# Patient Record
Sex: Female | Born: 1965 | Race: White | Hispanic: No | Marital: Married | Smoking: Never smoker
Health system: Southern US, Community
[De-identification: ages and names within clinical notes are randomized; demographics above are authoritative.]

## PROBLEM LIST (undated history)

## (undated) DIAGNOSIS — Z9889 Other specified postprocedural states: Secondary | ICD-10-CM

## (undated) DIAGNOSIS — G4730 Sleep apnea, unspecified: Secondary | ICD-10-CM

## (undated) DIAGNOSIS — R112 Nausea with vomiting, unspecified: Secondary | ICD-10-CM

## (undated) DIAGNOSIS — R0902 Hypoxemia: Secondary | ICD-10-CM

## (undated) DIAGNOSIS — T4145XA Adverse effect of unspecified anesthetic, initial encounter: Secondary | ICD-10-CM

## (undated) DIAGNOSIS — T7840XA Allergy, unspecified, initial encounter: Secondary | ICD-10-CM

## (undated) DIAGNOSIS — E079 Disorder of thyroid, unspecified: Secondary | ICD-10-CM

## (undated) DIAGNOSIS — M1990 Unspecified osteoarthritis, unspecified site: Secondary | ICD-10-CM

## (undated) DIAGNOSIS — J45909 Unspecified asthma, uncomplicated: Secondary | ICD-10-CM

## (undated) DIAGNOSIS — C50919 Malignant neoplasm of unspecified site of unspecified female breast: Secondary | ICD-10-CM

## (undated) DIAGNOSIS — T8859XA Other complications of anesthesia, initial encounter: Secondary | ICD-10-CM

## (undated) DIAGNOSIS — E039 Hypothyroidism, unspecified: Secondary | ICD-10-CM

## (undated) HISTORY — PX: APPENDECTOMY: SHX54

## (undated) HISTORY — PX: ABDOMINAL HYSTERECTOMY: SHX81

## (undated) HISTORY — DX: Allergy, unspecified, initial encounter: T78.40XA

## (undated) HISTORY — DX: Hypoxemia: R09.02

## (undated) HISTORY — DX: Disorder of thyroid, unspecified: E07.9

## (undated) HISTORY — PX: CHOLECYSTECTOMY: SHX55

## (undated) HISTORY — DX: Unspecified asthma, uncomplicated: J45.909

## (undated) HISTORY — DX: Unspecified osteoarthritis, unspecified site: M19.90

## (undated) HISTORY — DX: Sleep apnea, unspecified: G47.30

## (undated) HISTORY — DX: Malignant neoplasm of unspecified site of unspecified female breast: C50.919

---
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
## 1989-12-27 HISTORY — PX: APPENDECTOMY: SHX54

## 2007-09-12 ENCOUNTER — Emergency Department: Payer: Self-pay | Admitting: Emergency Medicine

## 2012-06-20 ENCOUNTER — Ambulatory Visit: Payer: Self-pay

## 2012-06-20 LAB — HM MAMMOGRAPHY

## 2013-03-22 ENCOUNTER — Ambulatory Visit: Payer: Self-pay

## 2013-07-05 ENCOUNTER — Ambulatory Visit: Payer: Self-pay

## 2013-07-23 ENCOUNTER — Ambulatory Visit: Payer: Self-pay

## 2013-07-23 LAB — HM MAMMOGRAPHY

## 2014-08-13 LAB — TSH: TSH: 1.34 u[IU]/mL (ref 0.41–5.90)

## 2014-08-27 ENCOUNTER — Ambulatory Visit: Payer: Self-pay

## 2014-08-27 LAB — HM MAMMOGRAPHY: HM Mammogram: NEGATIVE

## 2014-11-04 ENCOUNTER — Ambulatory Visit: Payer: Self-pay | Admitting: Internal Medicine

## 2014-11-19 ENCOUNTER — Ambulatory Visit: Payer: Self-pay | Admitting: Otolaryngology

## 2015-01-13 ENCOUNTER — Encounter (INDEPENDENT_AMBULATORY_CARE_PROVIDER_SITE_OTHER): Payer: Self-pay

## 2015-01-13 ENCOUNTER — Ambulatory Visit (INDEPENDENT_AMBULATORY_CARE_PROVIDER_SITE_OTHER): Payer: Self-pay | Admitting: Internal Medicine

## 2015-01-13 ENCOUNTER — Encounter: Payer: Self-pay | Admitting: Internal Medicine

## 2015-01-13 VITALS — BP 113/75 | HR 84 | Temp 98.0°F | Ht 61.0 in | Wt 136.2 lb

## 2015-01-13 DIAGNOSIS — J4520 Mild intermittent asthma, uncomplicated: Secondary | ICD-10-CM

## 2015-01-13 DIAGNOSIS — Z9109 Other allergy status, other than to drugs and biological substances: Secondary | ICD-10-CM

## 2015-01-13 DIAGNOSIS — E039 Hypothyroidism, unspecified: Secondary | ICD-10-CM

## 2015-01-13 DIAGNOSIS — Z91048 Other nonmedicinal substance allergy status: Secondary | ICD-10-CM

## 2015-01-13 DIAGNOSIS — N809 Endometriosis, unspecified: Secondary | ICD-10-CM

## 2015-01-13 DIAGNOSIS — N951 Menopausal and female climacteric states: Secondary | ICD-10-CM

## 2015-01-13 NOTE — Progress Notes (Signed)
Pre visit review using our clinic review tool, if applicable. No additional management support is needed unless otherwise documented below in the visit note. 

## 2015-01-14 ENCOUNTER — Other Ambulatory Visit (INDEPENDENT_AMBULATORY_CARE_PROVIDER_SITE_OTHER): Payer: BC Managed Care – PPO

## 2015-01-14 ENCOUNTER — Other Ambulatory Visit: Payer: Self-pay | Admitting: Internal Medicine

## 2015-01-14 ENCOUNTER — Telehealth: Payer: Self-pay | Admitting: *Deleted

## 2015-01-14 DIAGNOSIS — Z13220 Encounter for screening for lipoid disorders: Secondary | ICD-10-CM

## 2015-01-14 DIAGNOSIS — E039 Hypothyroidism, unspecified: Secondary | ICD-10-CM | POA: Insufficient documentation

## 2015-01-14 DIAGNOSIS — R5383 Other fatigue: Secondary | ICD-10-CM

## 2015-01-14 DIAGNOSIS — E034 Atrophy of thyroid (acquired): Secondary | ICD-10-CM

## 2015-01-14 LAB — COMPREHENSIVE METABOLIC PANEL
ALT: 32 U/L (ref 0–35)
AST: 23 U/L (ref 0–37)
Albumin: 4.1 g/dL (ref 3.5–5.2)
Alkaline Phosphatase: 70 U/L (ref 39–117)
BUN: 14 mg/dL (ref 6–23)
CHLORIDE: 110 meq/L (ref 96–112)
CO2: 27 mEq/L (ref 19–32)
Calcium: 9.6 mg/dL (ref 8.4–10.5)
Creatinine, Ser: 0.76 mg/dL (ref 0.40–1.20)
GFR: 86.76 mL/min (ref >60.00)
GLUCOSE: 75 mg/dL (ref 70–99)
POTASSIUM: 5.1 meq/L (ref 3.5–5.1)
Sodium: 142 mEq/L (ref 135–145)
TOTAL PROTEIN: 6.7 g/dL (ref 6.0–8.3)
Total Bilirubin: 0.5 mg/dL (ref 0.2–1.2)

## 2015-01-14 LAB — LIPID PANEL
CHOLESTEROL: 181 mg/dL (ref 0–200)
HDL: 54.1 mg/dL (ref >39.00)
LDL CALC: 110 mg/dL — AB (ref 0–99)
NonHDL: 126.9
Total CHOL/HDL Ratio: 3
Triglycerides: 83 mg/dL (ref 0.0–149.0)
VLDL: 16.6 mg/dL (ref 0.0–40.0)

## 2015-01-14 LAB — CBC WITH DIFFERENTIAL/PLATELET
BASOS PCT: 0.4 % (ref 0.0–3.0)
Basophils Absolute: 0 10*3/uL (ref 0.0–0.1)
EOS PCT: 4.7 % (ref 0.0–5.0)
Eosinophils Absolute: 0.4 10*3/uL (ref 0.0–0.7)
HCT: 44.7 % (ref 36.0–46.0)
Hemoglobin: 15 g/dL (ref 12.0–15.0)
LYMPHS PCT: 33.2 % (ref 12.0–46.0)
Lymphs Abs: 2.7 10*3/uL (ref 0.7–4.0)
MCHC: 33.6 g/dL (ref 30.0–36.0)
MCV: 90.3 fl (ref 78.0–100.0)
MONOS PCT: 6.5 % (ref 3.0–12.0)
Monocytes Absolute: 0.5 10*3/uL (ref 0.1–1.0)
Neutro Abs: 4.4 10*3/uL (ref 1.4–7.7)
Neutrophils Relative %: 55.2 % (ref 43.0–77.0)
PLATELETS: 379 10*3/uL (ref 150.0–400.0)
RBC: 4.95 Mil/uL (ref 3.87–5.11)
RDW: 13.5 % (ref 11.5–15.5)
WBC: 8.1 10*3/uL (ref 4.0–10.5)

## 2015-01-14 NOTE — Telephone Encounter (Signed)
What labs and dx?  

## 2015-01-14 NOTE — Telephone Encounter (Signed)
Order placed for labs.

## 2015-01-15 ENCOUNTER — Encounter: Payer: Self-pay | Admitting: Internal Medicine

## 2015-01-15 LAB — TSH: TSH: 1.13 u[IU]/mL (ref 0.35–4.50)

## 2015-01-17 ENCOUNTER — Encounter: Payer: Self-pay | Admitting: Internal Medicine

## 2015-01-17 DIAGNOSIS — Z9109 Other allergy status, other than to drugs and biological substances: Secondary | ICD-10-CM | POA: Insufficient documentation

## 2015-01-17 DIAGNOSIS — J45909 Unspecified asthma, uncomplicated: Secondary | ICD-10-CM | POA: Insufficient documentation

## 2015-01-17 DIAGNOSIS — N951 Menopausal and female climacteric states: Secondary | ICD-10-CM | POA: Insufficient documentation

## 2015-01-17 DIAGNOSIS — N809 Endometriosis, unspecified: Secondary | ICD-10-CM | POA: Insufficient documentation

## 2015-01-17 NOTE — Progress Notes (Signed)
   Subjective:    Patient ID: Angela Pacheco, female    DOB: 18-Oct-1966, 49 y.o.   MRN: [id number]  HPI 48 year old female with past history of allergies, mild asthma and hypothyroidism.  She comes in today to follow up on these issues as well as to establish care.  Former pt at [facility].  States her allergies are triggered by cats.  Takes claritin.  Is also taking a menopause supplement.  Helps with hot flashes and night sweats.  Has noticed some weight gain.  Is s/p hysterectomy for endometriosis.  Cervix left in place.  Has never had an abnormal pap smear.  She tries to avoid wheat and has tried to be gluten free. This has helped with bloating.   No acid reflux.  No bowel change reported.  Breathing stable.     Past Medical History  Diagnosis Date  . Allergy     cats trigger  . Thyroid disease   . Asthma     mild    Outpatient Encounter Prescriptions as of 01/13/2015  Medication Sig  . levothyroxine (SYNTHROID, LEVOTHROID) 88 MCG tablet Take 88 mcg by mouth daily.    Review of Systems Patient denies any headache, lightheadedness or dizziness.  No significant sinus or allergy symptoms now.   No chest pain, tightness or palpitations.  No increased shortness of breath or congestion.  No nausea or vomiting.  No abdominal pain or cramping.  No bowel change, such as diarrhea, constipation, BRBPR or melana.  No urine change.   No vaginal problems.  Is s/p hysterectomy.  Some hot flashes and night sweats.  Better taking her supplement.  Controlling her diet.  Helps with bloating.       Objective:   Physical Exam Filed Vitals:   01/13/15 1614  BP: 113/75  Pulse: 84  Temp: 98 F (48.38 C)   49 year old female in no acute distress.   HEENT:  Nares- clear.  Oropharynx - without lesions. NECK:  Supple.  Nontender.  No audible bruit.  HEART:  Appears to be regular. LUNGS:  No crackles or wheezing audible.  Respirations even and unlabored.  RADIAL PULSE:  Equal  bilaterally. ABDOMEN:  Soft, nontender.  Bowel sounds present and normal.  No audible abdominal bruit.    EXTREMITIES:  No increased edema present.  DP pulses palpable and equal bilaterally.          Assessment & Plan:  1. Environmental allergies Controlled.  On claritin.   2. Asthma, mild intermittent, uncomplicated Breathing stable.  Follow.    3. Menopausal symptoms With some hot flashes and night sweats as outlined.  Better on her supplement.  Follow.    4. Endometriosis Is s/p hysterectomy.  Cervix left in place.    5. Hypothyroidism, unspecified hypothyroidism type On synthroid.  Follow tsh.    HEALTH MAINTENANCE.  Get her back in for a physical.  Will need pap smear since cervix left in place.  Had mammogram summer 2015.  Obtain records.    I spent more than 30 minutes with the patient and more than 50% of the time was spent in consultation regarding the above.

## 2015-02-04 ENCOUNTER — Encounter: Payer: Self-pay | Admitting: Internal Medicine

## 2015-02-05 ENCOUNTER — Ambulatory Visit (INDEPENDENT_AMBULATORY_CARE_PROVIDER_SITE_OTHER): Payer: BC Managed Care – PPO | Admitting: Nurse Practitioner

## 2015-02-05 ENCOUNTER — Encounter: Payer: Self-pay | Admitting: Nurse Practitioner

## 2015-02-05 VITALS — BP 106/76 | HR 76 | Temp 97.6°F | Resp 12 | Ht 61.0 in | Wt 137.0 lb

## 2015-02-05 DIAGNOSIS — B9689 Other specified bacterial agents as the cause of diseases classified elsewhere: Secondary | ICD-10-CM

## 2015-02-05 DIAGNOSIS — J019 Acute sinusitis, unspecified: Secondary | ICD-10-CM | POA: Insufficient documentation

## 2015-02-05 MED ORDER — DOXYCYCLINE HYCLATE 100 MG PO TABS: 100.0000 mg | ORAL_TABLET | Freq: Two times a day (BID) | ORAL | Status: DC

## 2015-02-05 NOTE — Patient Instructions (Signed)
Call us if worsening or failing to improve within 5 days.   Please take a probiotic ( Align, Floraque or Culturelle) while you are on the antibiotic to prevent a serious antibiotic associated diarrhea  Called clostirudium dificile colitis and a vaginal yeast infection.

## 2015-02-05 NOTE — Progress Notes (Signed)
Subjective:    Patient ID: Angela Pacheco, female    DOB: [birth year], 49 y.o.   MRN: [id number]  HPI   Angela Pacheco is a 49 yo female with a CC of sinus pressure.   1) Noticed 3 weeks ago, sinus pressure on right maxillary sinus, red blotch yesterday under right eye, A lot of pressure in nose and forehead, teeth on top ache on both sides, has bad smell occasionally in nose Neti Pot- somewhat helpful Daytime cold medicine- Helpful  Claritin D- Helpful   Discussed diet and exercise plan. She is making plans to increase exercise.   Review of Systems  Constitutional: Positive for fatigue. Negative for fever, chills and diaphoresis.  HENT: Positive for congestion, ear pain, facial swelling, postnasal drip, rhinorrhea, sinus pressure and sneezing. Negative for ear discharge and sore throat.   Eyes: Negative for pain, discharge, redness, itching and visual disturbance.  Respiratory: Positive for cough. Negative for chest tightness, shortness of breath and wheezing.   Cardiovascular: Negative for chest pain, palpitations and leg swelling.  Gastrointestinal: Negative for nausea, vomiting and diarrhea.  Skin: Negative for rash.  Neurological: Positive for headaches. Negative for dizziness.   Past Medical History  Diagnosis Date  . Allergy     cats trigger  . Thyroid disease   . Asthma     mild    History   Social History  . Marital Status: Married    Spouse Name: N/A  . Number of Children: N/A  . Years of Education: N/A   Occupational History  . Not on file.   Social History Main Topics  . Smoking status: Never Smoker   . Smokeless tobacco: Never Used  . Alcohol Use: No  . Drug Use: No  . Sexual Activity: Not on file   Other Topics Concern  . Not on file   Social History Narrative    Past Surgical History  Procedure Laterality Date  . Abdominal hysterectomy      cervix left in place  . Cholecystectomy    . Appendectomy  1991    Family History    Problem Relation Age of Onset  . Arthritis Mother     No Known Allergies  Current Outpatient Prescriptions on File Prior to Visit  Medication Sig Dispense Refill  . levothyroxine (SYNTHROID, LEVOTHROID) 88 MCG tablet Take 88 mcg by mouth daily.  0   No current facility-administered medications on file prior to visit.   Past Medical History  Diagnosis Date  . Allergy     cats trigger  . Thyroid disease   . Asthma     mild    History   Social History  . Marital Status: Married    Spouse Name: N/A  . Number of Children: N/A  . Years of Education: N/A   Occupational History  . Not on file.   Social History Main Topics  . Smoking status: Never Smoker   . Smokeless tobacco: Never Used  . Alcohol Use: No  . Drug Use: No  . Sexual Activity: Not on file   Other Topics Concern  . Not on file   Social History Narrative    Past Surgical History  Procedure Laterality Date  . Abdominal hysterectomy      cervix left in place  . Cholecystectomy    . Appendectomy  1991    Family History  Problem Relation Age of Onset  . Arthritis Mother     No Known Allergies  Current Outpatient  Prescriptions on File Prior to Visit  Medication Sig Dispense Refill  . levothyroxine (SYNTHROID, LEVOTHROID) 88 MCG tablet Take 88 mcg by mouth daily.  0   No current facility-administered medications on file prior to visit.      Objective:   Physical Exam  Constitutional: She is oriented to person, place, and time. She appears well-developed and well-nourished. No distress.  BP 106/76 mmHg  Pulse 76  Temp(Src) 97.6 F (36.4 C) (Oral)  Resp 12  Ht 5\' 1"  (1.549 m)  Wt 137 lb (62.143 kg)  BMI 25.90 kg/m2  SpO2 97%  LMP    HENT:  Head: Normocephalic and atraumatic.  Right Ear: External ear normal.  Left Ear: External ear normal.  Mouth/Throat: No oropharyngeal exudate.  Eyes: Conjunctivae and EOM are normal. Pupils are equal, round, and reactive to light. Right eye exhibits  no discharge. Left eye exhibits no discharge. No scleral icterus.  Right eye is slightly puffier underneath, face is tender to palpation on right  Neck: Normal range of motion. Neck supple. No thyromegaly present.  Cardiovascular: Normal rate, regular rhythm, normal heart sounds and intact distal pulses.  Exam reveals no gallop and no friction rub.   No murmur heard. Pulmonary/Chest: Effort normal and breath sounds normal. No respiratory distress. She has no wheezes. She has no rales. She exhibits no tenderness.  Lymphadenopathy:    She has no cervical adenopathy.  Neurological: She is alert and oriented to person, place, and time. No cranial nerve deficit. She exhibits normal muscle tone. Coordination normal.  Skin: Skin is warm and dry. No rash noted. She is not diaphoretic.  Psychiatric: She has a normal mood and affect. Her behavior is normal. Judgment and thought content normal.       Assessment & Plan:

## 2015-02-05 NOTE — Assessment & Plan Note (Signed)
Worsening. 3 weeks with no relief and worsening symptoms. Doxycycline 100 mg twice daily for 7 days. Continue with OTC medications also and encouraged probiotic use. FU prn worsening/failure to improve.

## 2015-02-05 NOTE — Progress Notes (Signed)
Pre visit review using our clinic review tool, if applicable. No additional management support is needed unless otherwise documented below in the visit note. 

## 2015-02-11 MED ORDER — FLUCONAZOLE 150 MG PO TABS: ORAL_TABLET | ORAL | Status: DC

## 2015-02-11 NOTE — Telephone Encounter (Signed)
Order placed for diflucan x 2 tablets as directed.

## 2015-03-31 ENCOUNTER — Ambulatory Visit (INDEPENDENT_AMBULATORY_CARE_PROVIDER_SITE_OTHER): Payer: BC Managed Care – PPO | Admitting: Internal Medicine

## 2015-03-31 ENCOUNTER — Encounter: Payer: Self-pay | Admitting: Internal Medicine

## 2015-03-31 ENCOUNTER — Other Ambulatory Visit (HOSPITAL_COMMUNITY)
Admission: RE | Admit: 2015-03-31 | Discharge: 2015-03-31 | Disposition: A | Discharge status: Home / Self Care | Payer: BC Managed Care – PPO | Source: Ambulatory Visit | Attending: Internal Medicine | Admitting: Internal Medicine

## 2015-03-31 VITALS — BP 100/70 | HR 75 | Temp 98.5°F | Ht 61.0 in | Wt 136.2 lb

## 2015-03-31 DIAGNOSIS — Z01411 Encounter for gynecological examination (general) (routine) with abnormal findings: Secondary | ICD-10-CM | POA: Diagnosis present

## 2015-03-31 DIAGNOSIS — Z1151 Encounter for screening for human papillomavirus (HPV): Secondary | ICD-10-CM | POA: Insufficient documentation

## 2015-03-31 DIAGNOSIS — Z9109 Other allergy status, other than to drugs and biological substances: Secondary | ICD-10-CM

## 2015-03-31 DIAGNOSIS — J4520 Mild intermittent asthma, uncomplicated: Secondary | ICD-10-CM

## 2015-03-31 DIAGNOSIS — Z124 Encounter for screening for malignant neoplasm of cervix: Secondary | ICD-10-CM

## 2015-03-31 DIAGNOSIS — E039 Hypothyroidism, unspecified: Secondary | ICD-10-CM | POA: Diagnosis not present

## 2015-03-31 DIAGNOSIS — Z91048 Other nonmedicinal substance allergy status: Secondary | ICD-10-CM

## 2015-03-31 DIAGNOSIS — Z0000 Encounter for general adult medical examination without abnormal findings: Secondary | ICD-10-CM | POA: Diagnosis not present

## 2015-03-31 MED ORDER — LEVOTHYROXINE SODIUM 88 MCG PO TABS: 88.0000 ug | ORAL_TABLET | Freq: Every day | ORAL | Status: DC

## 2015-03-31 NOTE — Progress Notes (Signed)
Pre visit review using our clinic review tool, if applicable. No additional management support is needed unless otherwise documented below in the visit note. 

## 2015-03-31 NOTE — Progress Notes (Signed)
Patient ID: Angela Pacheco, female   DOB: 08-13-1966, 49 y.o.   MRN: [id number]   Subjective:    Patient ID: Angela Pacheco, female    DOB: July 08, 1966, 49 y.o.   MRN: [id number]  HPI  Patient here for a physical.  States she is doing relatively well.  No cardiac symptoms with increased activity or exertion.  [place].  Exercises four days per week.  Is dieting.  Breathing stable.  Eating and drinking well.  No nausea or vomiting.  No bowel change.  Using claritin D and flonase for her allergies.     Past Medical History  Diagnosis Date  . Allergy     cats trigger  . Thyroid disease   . Asthma     mild    Outpatient Encounter Prescriptions as of 03/31/2015  Medication Sig  . fluticasone (FLONASE) 50 MCG/ACT nasal spray Place 2 sprays into both nostrils daily.  Marland Kitchen levothyroxine (SYNTHROID, LEVOTHROID) 88 MCG tablet Take 1 tablet (88 mcg total) by mouth daily.  Marland Kitchen loratadine-pseudoephedrine (CLARITIN-D 24-HOUR) 10-240 MG per 24 hr tablet Take 1 tablet by mouth daily.  Marland Kitchen Specialty Vitamins Products (MENOPAUSE SUPPORT PO) Take by mouth.  . [DISCONTINUED] levothyroxine (SYNTHROID, LEVOTHROID) 88 MCG tablet Take 88 mcg by mouth daily.  . [DISCONTINUED] doxycycline (VIBRA-TABS) 100 MG tablet Take 1 tablet (100 mg total) by mouth 2 (two) times daily.  . [DISCONTINUED] fluconazole (DIFLUCAN) 150 MG tablet Take one tablet x 1 and repeat in 3 days if no improvement.    Review of Systems  Constitutional: Negative for appetite change and unexpected weight change.  HENT: Negative for congestion and sinus pressure.   Eyes: Negative for pain and visual disturbance.  Respiratory: Negative for cough, chest tightness and shortness of breath.   Cardiovascular: Negative for chest pain, palpitations and leg swelling.  Gastrointestinal: Negative for nausea, vomiting, abdominal pain and diarrhea.  Genitourinary: Negative for dysuria and difficulty urinating.  Musculoskeletal:  Negative for back pain and joint swelling.       Exercising.    Skin: Negative for color change and rash.  Neurological: Negative for dizziness, light-headedness and headaches.  Hematological: Negative for adenopathy. Does not bruise/bleed easily.  Psychiatric/Behavioral: Negative for dysphoric mood and agitation.       Objective:    Physical Exam  Constitutional: She is oriented to person, place, and time. She appears well-developed and well-nourished.  HENT:  Nose: Nose normal.  Mouth/Throat: Oropharynx is clear and moist.  Eyes: Right eye exhibits no discharge. Left eye exhibits no discharge. No scleral icterus.  Neck: Neck supple. No thyromegaly present.  Cardiovascular: Normal rate and regular rhythm.   Pulmonary/Chest: Breath sounds normal. No accessory muscle usage. No tachypnea. No respiratory distress. She has no decreased breath sounds. She has no wheezes. She has no rhonchi. Right breast exhibits no inverted nipple, no mass, no nipple discharge and no tenderness (no axillary adenopathy). Left breast exhibits no inverted nipple, no mass, no nipple discharge and no tenderness (no axilarry adenopathy).  Abdominal: Soft. Bowel sounds are normal. There is no tenderness.  Genitourinary:  Normal external genitalia.  Vaginal vault without lesions.  Cervix identified.  Pap smear performed.  Could not appreciate any adnexal masses or tenderness.    Musculoskeletal: She exhibits no edema or tenderness.  Lymphadenopathy:    She has no cervical adenopathy.  Neurological: She is alert and oriented to person, place, and time.  Skin: Skin is warm. No rash noted.  Psychiatric: She  has a normal mood and affect. Her behavior is normal.    BP 100/70 mmHg  Pulse 75  Temp(Src) 98.5 F (36.9 C) (Oral)  Ht 5\' 1"  (1.549 m)  Wt 136 lb 4 oz (61.803 kg)  BMI 25.76 kg/m2  SpO2 98%  LMP  Wt Readings from Last 3 Encounters:  03/31/15 136 lb 4 oz (61.803 kg)  02/05/15 137 lb (62.143 kg)    01/13/15 136 lb 4 oz (61.803 kg)     Lab Results  Component Value Date   WBC 8.1 01/14/2015   HGB 15.0 01/14/2015   HCT 44.7 01/14/2015   PLT 379.0 01/14/2015   GLUCOSE 75 01/14/2015   CHOL 181 01/14/2015   TRIG 83.0 01/14/2015   HDL 54.10 01/14/2015   LDLCALC 110* 01/14/2015   ALT 32 01/14/2015   AST 23 01/14/2015   NA 142 01/14/2015   K 5.1 01/14/2015   CL 110 01/14/2015   CREATININE 0.76 01/14/2015   BUN 14 01/14/2015   CO2 27 01/14/2015   TSH 1.13 01/14/2015       Assessment & Plan:   Problem List Items Addressed This Visit    Asthma    Breathing stable.        Environmental allergies    Doing well with claritin D and flonase.  Follow.        Health care maintenance    Physical 03/31/15.  PAP 03/31/15.  Mammogram 08/27/14 - Birads I.        Hypothyroidism    On thyroid replacement.  Follow tsh.        Relevant Medications   levothyroxine (SYNTHROID, LEVOTHROID) tablet    Other Visit Diagnoses    Pap smear for cervical cancer screening    -  Primary    Relevant Orders    Cytology - PAP (Completed)      I spent 25 minutes with the patient and more than 50% of the time was spent in consultation regarding the above.     [name], MD

## 2015-04-02 LAB — CYTOLOGY - PAP

## 2015-04-04 ENCOUNTER — Encounter: Payer: Self-pay | Admitting: Internal Medicine

## 2015-04-05 ENCOUNTER — Encounter: Payer: Self-pay | Admitting: Internal Medicine

## 2015-04-05 DIAGNOSIS — Z0000 Encounter for general adult medical examination without abnormal findings: Secondary | ICD-10-CM | POA: Insufficient documentation

## 2015-04-05 NOTE — Assessment & Plan Note (Signed)
Doing well with claritin D and flonase.  Follow.

## 2015-04-05 NOTE — Assessment & Plan Note (Signed)
Physical 03/31/15.  PAP 03/31/15.  Mammogram 08/27/14 - Birads I.

## 2015-04-05 NOTE — Assessment & Plan Note (Signed)
On thyroid replacement.  Follow tsh.  

## 2015-04-05 NOTE — Assessment & Plan Note (Signed)
Breathing stable.

## 2015-06-03 ENCOUNTER — Encounter: Payer: Self-pay | Admitting: Internal Medicine

## 2015-06-05 ENCOUNTER — Ambulatory Visit: Payer: BC Managed Care – PPO | Admitting: Internal Medicine

## 2015-06-05 DIAGNOSIS — Z0289 Encounter for other administrative examinations: Secondary | ICD-10-CM

## 2015-06-09 ENCOUNTER — Encounter: Payer: Self-pay | Admitting: Nurse Practitioner

## 2015-06-09 ENCOUNTER — Ambulatory Visit (INDEPENDENT_AMBULATORY_CARE_PROVIDER_SITE_OTHER): Payer: BC Managed Care – PPO | Admitting: Nurse Practitioner

## 2015-06-09 VITALS — BP 102/70 | HR 72 | Temp 97.4°F | Resp 12 | Ht 61.0 in | Wt 134.1 lb

## 2015-06-09 DIAGNOSIS — B9689 Other specified bacterial agents as the cause of diseases classified elsewhere: Secondary | ICD-10-CM

## 2015-06-09 DIAGNOSIS — J019 Acute sinusitis, unspecified: Secondary | ICD-10-CM | POA: Diagnosis not present

## 2015-06-09 MED ORDER — DOXYCYCLINE HYCLATE 100 MG PO TABS: 100.0000 mg | ORAL_TABLET | Freq: Two times a day (BID) | ORAL | Status: DC

## 2015-06-09 NOTE — Patient Instructions (Signed)
Continue OTC measures, doxycyline twice daily x 7 days, probiotics recommended. Call us if not helpful in 1 week.

## 2015-06-09 NOTE — Progress Notes (Signed)
   Subjective:    Patient ID: Angela Pacheco, female    DOB: [birth year], 49 y.o.   MRN: [id number]  HPI  Angela Pacheco is a 49 yo female with a CC of sinusitis x 1 week.   1) Sinusitis- last episode was 02/05/15, pain, pressure, headache on right side Claritin, Decongestants, Neti Pot, Aleve    Review of Systems  Constitutional: Negative for fever, chills, diaphoresis and fatigue.  HENT: Positive for congestion, ear pain, sinus pressure, sneezing and sore throat. Negative for postnasal drip, rhinorrhea and trouble swallowing.        Maxillary  Eyes: Negative for visual disturbance.  Respiratory: Negative for cough, chest tightness, shortness of breath and wheezing.   Cardiovascular: Negative for chest pain, palpitations and leg swelling.  Gastrointestinal: Negative for nausea, vomiting and diarrhea.  Skin: Negative for rash.  Neurological: Negative for dizziness, numbness and headaches.      Objective:   Physical Exam  Constitutional: She is oriented to person, place, and time. She appears well-developed and well-nourished. No distress.  BP 102/70 mmHg  Pulse 72  Temp(Src) 97.4 F (36.3 C) (Oral)  Resp 12  Ht 5\' 1"  (1.549 m)  Wt 134 lb 1.9 oz (60.836 kg)  BMI 25.35 kg/m2  SpO2 98%   HENT:  Head: Normocephalic and atraumatic.  Right Ear: External ear normal.  Left Ear: External ear normal.  Mouth/Throat: Oropharynx is clear and moist.  Maxillary and frontal tenderness on the right side, TM on right injected and canal swollen, left injected to a lesser extent and slightly swollen canal.   Eyes: EOM are normal. Pupils are equal, round, and reactive to light. Right eye exhibits no discharge. Left eye exhibits no discharge. No scleral icterus.  Neck: Normal range of motion. Neck supple. Thyromegaly present.  Known nodules on right  Cardiovascular: Normal rate, regular rhythm and normal heart sounds.  Exam reveals no gallop and no friction rub.   No murmur  heard. Pulmonary/Chest: Effort normal and breath sounds normal. No respiratory distress. She has no wheezes. She has no rales. She exhibits no tenderness.  Lymphadenopathy:    She has cervical adenopathy.  Neurological: She is alert and oriented to person, place, and time. No cranial nerve deficit. She exhibits normal muscle tone. Coordination normal.  Skin: Skin is warm and dry. No rash noted. She is not diaphoretic.  Psychiatric: She has a normal mood and affect. Her behavior is normal. Judgment and thought content normal.      Assessment & Plan:

## 2015-06-09 NOTE — Progress Notes (Signed)
Pre visit review using our clinic review tool, if applicable. No additional management support is needed unless otherwise documented below in the visit note. 

## 2015-06-09 NOTE — Assessment & Plan Note (Addendum)
Worsening, greater than 7 days, continue OTC medications, encouraged probiotics, Doxycyline 100 mg bid x 7 days. FU prn worsening/failure to improve. Hysterectomy- doxy okay

## 2015-10-13 ENCOUNTER — Encounter: Payer: Self-pay | Admitting: Family Medicine

## 2015-10-13 ENCOUNTER — Ambulatory Visit (INDEPENDENT_AMBULATORY_CARE_PROVIDER_SITE_OTHER): Payer: BC Managed Care – PPO | Admitting: Family Medicine

## 2015-10-13 ENCOUNTER — Ambulatory Visit: Payer: BC Managed Care – PPO | Admitting: Nurse Practitioner

## 2015-10-13 VITALS — BP 112/78 | HR 69 | Temp 98.5°F | Ht 61.0 in | Wt 141.5 lb

## 2015-10-13 DIAGNOSIS — S0990XA Unspecified injury of head, initial encounter: Secondary | ICD-10-CM | POA: Diagnosis not present

## 2015-10-13 MED ORDER — ONDANSETRON HCL 4 MG PO TABS: 4.0000 mg | ORAL_TABLET | Freq: Three times a day (TID) | ORAL | Status: DC | PRN

## 2015-10-13 NOTE — Progress Notes (Signed)
Subjective:  Patient ID: Angela Pacheco, female    DOB: Dec 27, 1966  Age: 49 y.o. MRN: [id number]  CC: Head injury  HPI:  49 year old female presents to clinic today for evaluation of a head injury.  Head injury  Patient reports that she hit her head on the doorknob last night at ~ 9:30 PM after standing up (she was harnessing her dog).  She reports that she hit the back of her head.  After hitting her head, she developed dizziness, headache and nausea.   No vision changes. No loss of conscious.  Patient resents today for evaluation. She continues to have nausea, headache, and intermittent dizziness. She describes the dizziness as feeling "off balance".  She states that she has difficulty concentrating.  She's taken some NSAIDs for her headache.  No relieving factors.  Her husband is concerned about concussion.  Social Hx   Social History   Social History  . Marital Status: Married    Spouse Name: N/A  . Number of Children: N/A  . Years of Education: N/A   Social History Main Topics  . Smoking status: Never Smoker   . Smokeless tobacco: Never Used  . Alcohol Use: 0.0 oz/week    0 Standard drinks or equivalent per week     Comment: occ.  . Drug Use: No  . Sexual Activity: Not Asked   Other Topics Concern  . None   Social History Narrative   Review of Systems Per HPI Objective:  BP 112/78 mmHg  Pulse 69  Temp(Src) 98.5 F (36.9 C) (Oral)  Ht 5\' 1"  (1.549 m)  Wt 141 lb 8 oz (64.184 kg)  BMI 26.75 kg/m2  SpO2 98%  BP/Weight 10/13/2015 01/23/7866 06/02/2093  Systolic BP 709 628 366  Diastolic BP 78 70 70  Wt. (Lbs) 141.5 134.12 136.25  BMI 26.75 25.35 25.76   Physical Exam  Constitutional: She appears well-developed and well-nourished. No distress.  HENT:  Head: Normocephalic and atraumatic.  Mouth/Throat: Oropharynx is clear and moist.  Normal TM's bilaterally. No hematoma noted.   Neck: Neck supple.  Cardiovascular: Normal rate and  regular rhythm.   No murmur heard. Pulmonary/Chest: Effort normal and breath sounds normal. No respiratory distress. She has no wheezes. She has no rales.  Abdominal: Soft. She exhibits no distension. There is no tenderness. There is no rebound and no guarding.  Lymphadenopathy:    She has no cervical adenopathy.  Neurological: She is alert.  CN 2-12 intact. Normal strength in all extremities. PERLA. EOMI.  Psychiatric:  Flat affect.   Vitals reviewed.  Lab Results  Component Value Date   WBC 8.1 01/14/2015   HGB 15.0 01/14/2015   HCT 44.7 01/14/2015   PLT 379.0 01/14/2015   GLUCOSE 75 01/14/2015   CHOL 181 01/14/2015   TRIG 83.0 01/14/2015   HDL 54.10 01/14/2015   LDLCALC 110* 01/14/2015   ALT 32 01/14/2015   AST 23 01/14/2015   NA 142 01/14/2015   K 5.1 01/14/2015   CL 110 01/14/2015   CREATININE 0.76 01/14/2015   BUN 14 01/14/2015   CO2 27 01/14/2015   TSH 1.13 01/14/2015   Assessment & Plan:   Problem List Items Addressed This Visit    Head injury - Primary    Reported symptoms suggestive of concussion although I sincerely doubt this given the mechanism of injury. Nevertheless, will treat as concussion (see below). Advised supportive care. Zofran for nausea. NSAID's for pain. Physical and cognitive rest. Out of work thru  10/19.         Meds ordered this encounter  Medications  . ondansetron (ZOFRAN) 4 MG tablet    Sig: Take 1 tablet (4 mg total) by mouth every 8 (eight) hours as needed for nausea or vomiting.    Dispense:  20 tablet    Refill:  0    Follow-up: PRN  Thersa Salt, DO

## 2015-10-13 NOTE — Assessment & Plan Note (Signed)
Reported symptoms suggestive of concussion although I sincerely doubt this given the mechanism of injury. Nevertheless, will treat as concussion (see below). Advised supportive care. Zofran for nausea. NSAID's for pain. Physical and cognitive rest. Out of work thru 10/19.

## 2015-10-13 NOTE — Patient Instructions (Signed)
Use the zofran as needed for nausea.  Be sure to rest.  Refrain from cell phones, TV, etc. You should have cognitive rest as well.  You can use tylenol or Ibuprofen for headache.  Follow up if you fail to improve or worsen.  Take care  Dr. Lacinda Axon

## 2015-10-13 NOTE — Progress Notes (Signed)
Pre visit review using our clinic review tool, if applicable. No additional management support is needed unless otherwise documented below in the visit note. 

## 2015-10-15 ENCOUNTER — Encounter: Payer: Self-pay | Admitting: Internal Medicine

## 2015-10-15 NOTE — Telephone Encounter (Signed)
I called pt and discussed her concerns.  She is feeling better.  No headache.  Light headedness has improved.  She is drinking plenty of fluids.  Will remain out of work for the rest of the week with anticipated return to work - 10/20/15.  Letter printed.  Please fax to [phone].  Thanks    Dr Nicki Reaper

## 2015-11-13 ENCOUNTER — Ambulatory Visit (INDEPENDENT_AMBULATORY_CARE_PROVIDER_SITE_OTHER): Payer: BC Managed Care – PPO

## 2015-11-13 DIAGNOSIS — Z23 Encounter for immunization: Secondary | ICD-10-CM | POA: Diagnosis not present

## 2015-12-28 HISTORY — PX: ANTERIOR CRUCIATE LIGAMENT REPAIR: SHX115

## 2016-02-09 ENCOUNTER — Encounter: Payer: Self-pay | Admitting: Nurse Practitioner

## 2016-02-09 ENCOUNTER — Ambulatory Visit (INDEPENDENT_AMBULATORY_CARE_PROVIDER_SITE_OTHER): Payer: BC Managed Care – PPO | Admitting: Nurse Practitioner

## 2016-02-09 VITALS — BP 100/64 | HR 78 | Temp 97.9°F | Ht 61.0 in | Wt 143.5 lb

## 2016-02-09 DIAGNOSIS — S46819A Strain of other muscles, fascia and tendons at shoulder and upper arm level, unspecified arm, initial encounter: Secondary | ICD-10-CM

## 2016-02-09 DIAGNOSIS — E039 Hypothyroidism, unspecified: Secondary | ICD-10-CM

## 2016-02-09 DIAGNOSIS — E663 Overweight: Secondary | ICD-10-CM

## 2016-02-09 LAB — TSH: TSH: 2.69 u[IU]/mL (ref 0.35–4.50)

## 2016-02-09 LAB — T4, FREE: FREE T4: 1.04 ng/dL (ref 0.60–1.60)

## 2016-02-09 MED ORDER — MELOXICAM 15 MG PO TABS: 15.0000 mg | ORAL_TABLET | Freq: Every day | ORAL | Status: DC

## 2016-02-09 NOTE — Patient Instructions (Signed)
Please visit the lab today.   Try the Dr. Derrel Nip diet recommendations.   Try the mobic and flexeril (muscle relaxer) again with heat and stretching for muscles. See what your insurance company says.

## 2016-02-09 NOTE — Progress Notes (Signed)
Pre visit review using our clinic review tool, if applicable. No additional management support is needed unless otherwise documented below in the visit note. 

## 2016-02-09 NOTE — Progress Notes (Signed)
Patient ID: Angela Pacheco, female    DOB: 07/31/1966  Age: 50 y.o. MRN: [id number]  CC: Weight Loss   HPI Angela Pacheco presents for CC of fatigue, weight loss, back pain/neck pain.   1) Thyroid- Needs to check per pt  Lots of fatigue Exercising 4-5 x a week for 30 minutes at least  Not eating anything sweet, bread, cheese  Protein shake with almond milk and fruit for breakfast Lean meat veggie and fruit for lunch 1 weekly cheat meal  Grilled chicken for dinner last night   2) Neck pain and shoulder pain worse lately  Indentions from bra strap bilaterally  Massage therapy and chiropractor- helpful  Wants symptom relief  Massager at home Bought new pillow  Heat, biofreeze Working out to help with these symptoms  Has muscle relaxer at home and has Corder has a past medical history of Allergy; Thyroid disease; and Asthma.   She has past surgical history that includes Abdominal hysterectomy; Cholecystectomy; and Appendectomy (1991).   Her family history includes Arthritis in her mother.She reports that she has never smoked. She has never used smokeless tobacco. She reports that she drinks alcohol. She reports that she does not use illicit drugs.  Outpatient Prescriptions Prior to Visit  Medication Sig Dispense Refill  . fluticasone (FLONASE) 50 MCG/ACT nasal spray Place 2 sprays into both nostrils daily.    Marland Kitchen levothyroxine (SYNTHROID, LEVOTHROID) 88 MCG tablet Take 1 tablet (88 mcg total) by mouth daily. 30 tablet 11  . loratadine-pseudoephedrine (CLARITIN-D 24-HOUR) 10-240 MG per 24 hr tablet Take 1 tablet by mouth daily.    Marland Kitchen Specialty Vitamins Products (MENOPAUSE SUPPORT PO) Take by mouth.    . doxycycline (VIBRA-TABS) 100 MG tablet Take 1 tablet (100 mg total) by mouth 2 (two) times daily. (Patient not taking: Reported on 02/09/2016) 14 tablet 0  . ondansetron (ZOFRAN) 4 MG tablet Take 1 tablet (4 mg total) by mouth every 8 (eight) hours as needed  for nausea or vomiting. (Patient not taking: Reported on 02/09/2016) 20 tablet 0   No facility-administered medications prior to visit.    ROS Review of Systems  Constitutional: Positive for fatigue and unexpected weight change. Negative for fever, chills and diaphoresis.       Gaining a few lbs per pt  Respiratory: Negative for chest tightness, shortness of breath and wheezing.   Cardiovascular: Negative for chest pain, palpitations and leg swelling.  Gastrointestinal: Negative for nausea, vomiting and diarrhea.  Endocrine: Negative for cold intolerance and heat intolerance.  Musculoskeletal: Positive for arthralgias and neck pain. Negative for neck stiffness.  Skin: Negative for rash.  Neurological: Negative for dizziness, weakness, numbness and headaches.  Psychiatric/Behavioral: The patient is not nervous/anxious.     Objective:  BP 100/64 mmHg  Pulse 78  Temp(Src) 97.9 F (36.6 C) (Oral)  Ht 5\' 1"  (1.549 m)  Wt 143 lb 8 oz (65.091 kg)  BMI 27.13 kg/m2  SpO2 98%  Physical Exam  Constitutional: She is oriented to person, place, and time. She appears well-developed and well-nourished. No distress.  HENT:  Head: Normocephalic and atraumatic.  Right Ear: External ear normal.  Left Ear: External ear normal.  Neck: Normal range of motion. Neck supple. No thyromegaly present.  Cardiovascular: Normal rate, regular rhythm and normal heart sounds.  Exam reveals no gallop and no friction rub.   No murmur heard. Pulmonary/Chest: Effort normal and breath sounds normal. No respiratory distress. She has no wheezes. She  has no rales. She exhibits no tenderness.  Large pendulous breasts   Musculoskeletal: Normal range of motion. She exhibits tenderness. She exhibits no edema.  Trapezius muscles are tight bilaterally and tender to palpation  Lymphadenopathy:    She has no cervical adenopathy.  Neurological: She is alert and oriented to person, place, and time. No cranial nerve deficit.  She exhibits normal muscle tone. Coordination normal.  Skin: Skin is warm and dry. No rash noted. She is not diaphoretic.  Psychiatric: She has a normal mood and affect. Her behavior is normal. Judgment and thought content normal.   Assessment & Plan:   Lorae was seen today for weight loss.  Diagnoses and all orders for this visit:  Hypothyroidism, unspecified hypothyroidism type -     TSH -     T4, free  Trapezius strain, unspecified laterality, initial encounter  Other orders -     meloxicam (MOBIC) 15 MG tablet; Take 1 tablet (15 mg total) by mouth daily.   I have discontinued Ms. Staub's doxycycline and ondansetron. I am also having her start on meloxicam. Additionally, I am having her maintain her loratadine-pseudoephedrine, levothyroxine, fluticasone, and Specialty Vitamins Products (MENOPAUSE SUPPORT PO).  Meds ordered this encounter  Medications  . meloxicam (MOBIC) 15 MG tablet    Sig: Take 1 tablet (15 mg total) by mouth daily.    Dispense:  30 tablet    Refill:  0    Order Specific Question:  Supervising Provider    Answer:  Crecencio Mc [[id number]]     Follow-up: Return if symptoms worsen or fail to improve.

## 2016-02-11 DIAGNOSIS — E663 Overweight: Secondary | ICD-10-CM | POA: Insufficient documentation

## 2016-02-11 DIAGNOSIS — S46819A Strain of other muscles, fascia and tendons at shoulder and upper arm level, unspecified arm, initial encounter: Secondary | ICD-10-CM | POA: Insufficient documentation

## 2016-02-11 NOTE — Assessment & Plan Note (Signed)
BMI is 27 currently Trying to lose weight  Gave her low glycemic index diet handout and discussed with her She is willing to try this and continue exercising regularly FU in April with Dr. Nicki Reaper  I have spent 25 minutes face to face > 50% of time spent discussing concerns, coming up with treatment options, looking at history, and ordering labs.

## 2016-02-11 NOTE — Assessment & Plan Note (Signed)
Checking TSH and T4 free today Pt reports she has been on 88 mcg for a long time and thinks it may need to be changed. We discussed how we decide to change medication. She verbalized understanding. Will follow after results via MyChart

## 2016-02-11 NOTE — Assessment & Plan Note (Signed)
New problem to me Mobic and flexeril at home Sees chiropractor and massage therapist regularly  Tried OTC treatment  She feels it is from her breasts being very large for her frame She is considering breat reduction, but has not spoken with plastic surgery. She will contact her insurance company first.  FU with her PCP in April

## 2016-03-12 ENCOUNTER — Other Ambulatory Visit: Payer: Self-pay | Admitting: Nurse Practitioner

## 2016-04-01 ENCOUNTER — Encounter: Payer: Self-pay | Admitting: Internal Medicine

## 2016-04-01 ENCOUNTER — Ambulatory Visit (INDEPENDENT_AMBULATORY_CARE_PROVIDER_SITE_OTHER): Payer: BC Managed Care – PPO | Admitting: Internal Medicine

## 2016-04-01 VITALS — BP 110/80 | HR 74 | Temp 98.4°F | Resp 18 | Ht 61.0 in | Wt 144.4 lb

## 2016-04-01 DIAGNOSIS — E039 Hypothyroidism, unspecified: Secondary | ICD-10-CM | POA: Diagnosis not present

## 2016-04-01 DIAGNOSIS — Z91048 Other nonmedicinal substance allergy status: Secondary | ICD-10-CM | POA: Diagnosis not present

## 2016-04-01 DIAGNOSIS — Z0000 Encounter for general adult medical examination without abnormal findings: Secondary | ICD-10-CM | POA: Diagnosis not present

## 2016-04-01 DIAGNOSIS — Z1239 Encounter for other screening for malignant neoplasm of breast: Secondary | ICD-10-CM

## 2016-04-01 DIAGNOSIS — N951 Menopausal and female climacteric states: Secondary | ICD-10-CM

## 2016-04-01 DIAGNOSIS — Z9109 Other allergy status, other than to drugs and biological substances: Secondary | ICD-10-CM

## 2016-04-01 MED ORDER — LEVOTHYROXINE SODIUM 88 MCG PO TABS: 88.0000 ug | ORAL_TABLET | Freq: Every day | ORAL | Status: DC

## 2016-04-01 NOTE — Progress Notes (Signed)
Pre-visit discussion using our clinic review tool. No additional management support is needed unless otherwise documented below in the visit note.  

## 2016-04-01 NOTE — Assessment & Plan Note (Signed)
Recent tsh and free T4 wnl.  Continue synthroid.

## 2016-04-01 NOTE — Assessment & Plan Note (Signed)
Physical today.  PAP 03/31/15 - negative with negative HPV.  Last mammogram in 2015.  Scheduled for mammogram.  She will find out of mother had colon polyps.

## 2016-04-01 NOTE — Assessment & Plan Note (Signed)
Is concerned about possible wheat allergy.  See note.  Refer to an allergist.

## 2016-04-01 NOTE — Assessment & Plan Note (Signed)
Takes estroven.  Does not feel needs any further intervention.

## 2016-04-01 NOTE — Progress Notes (Signed)
Patient ID: Angela Pacheco, female   DOB: January 08, 1966, 50 y.o.   MRN: [id number]   Subjective:    Patient ID: Angela Pacheco, female    DOB: 1966/07/20, 50 y.o.   MRN: [id number]  HPI  Patient here for her physical exam.  Doing well.  Is exercising.  Exercises 3-5x/week - 30 minutes to one hour.  Still issues with her left knee.  Has been seeing Dr Mack Guise.  Does limit some of her exercise.  Still exercises regularly.  Does not limit her regular walking.  No chest pain.  No sob.  No acid reflux.  No abdominal pain or cramping.  Bowels stable.  Does report has noticed a wheat allergy.  When eats, gets increased congestion and cough.  Also has noticed some bowel change.  She also is concerned regarding not being able to lose weight despite exercise and watching her diet.  Discussed diet and exercise.  Handling stress.     Past Medical History  Diagnosis Date  . Allergy     cats trigger  . Thyroid disease   . Asthma     mild   Past Surgical History  Procedure Laterality Date  . Abdominal hysterectomy      cervix left in place  . Cholecystectomy    . Appendectomy  1991   Family History  Problem Relation Age of Onset  . Arthritis Mother    Social History   Social History  . Marital Status: Married    Spouse Name: N/A  . Number of Children: N/A  . Years of Education: N/A   Social History Main Topics  . Smoking status: Never Smoker   . Smokeless tobacco: Never Used  . Alcohol Use: 0.0 oz/week    0 Standard drinks or equivalent per week     Comment: occ.  . Drug Use: No  . Sexual Activity: Not Asked   Other Topics Concern  . None   Social History Narrative    Outpatient Encounter Prescriptions as of 04/01/2016  Medication Sig  . fluticasone (FLONASE) 50 MCG/ACT nasal spray Place 2 sprays into both nostrils daily.  Marland Kitchen levothyroxine (SYNTHROID, LEVOTHROID) 88 MCG tablet Take 1 tablet (88 mcg total) by mouth daily.  Marland Kitchen loratadine-pseudoephedrine (CLARITIN-D  24-HOUR) 10-240 MG per 24 hr tablet Take 1 tablet by mouth daily.  . meloxicam (MOBIC) 15 MG tablet TAKE 1 TABLET(15 MG) BY MOUTH DAILY  . Specialty Vitamins Products (MENOPAUSE SUPPORT PO) Take by mouth.  . [DISCONTINUED] levothyroxine (SYNTHROID, LEVOTHROID) 88 MCG tablet Take 1 tablet (88 mcg total) by mouth daily.   No facility-administered encounter medications on file as of 04/01/2016.    Review of Systems  Constitutional: Negative for appetite change and unexpected weight change.  HENT: Negative for sinus pressure.        Congestion after eating.  No symptoms now.   Eyes: Negative for pain and visual disturbance.  Respiratory: Negative for chest tightness and shortness of breath.        She has noticed cough only when she eats.   Cardiovascular: Negative for chest pain, palpitations and leg swelling.  Gastrointestinal: Negative for nausea, vomiting, abdominal pain and diarrhea.  Genitourinary: Negative for dysuria and difficulty urinating.  Musculoskeletal: Negative for back pain and joint swelling.  Skin: Negative for color change and rash.  Neurological: Negative for dizziness, light-headedness and headaches.  Hematological: Negative for adenopathy. Does not bruise/bleed easily.  Psychiatric/Behavioral: Negative for dysphoric mood and agitation.  Objective:     Blood pressure rechecked by me:  108/68  Physical Exam  Constitutional: She is oriented to person, place, and time. She appears well-developed and well-nourished. No distress.  HENT:  Nose: Nose normal.  Mouth/Throat: Oropharynx is clear and moist.  Eyes: Right eye exhibits no discharge. Left eye exhibits no discharge. No scleral icterus.  Neck: Neck supple. No thyromegaly present.  Cardiovascular: Normal rate and regular rhythm.   Pulmonary/Chest: Breath sounds normal. No accessory muscle usage. No tachypnea. No respiratory distress. She has no decreased breath sounds. She has no wheezes. She has no rhonchi.  Right breast exhibits no inverted nipple, no mass, no nipple discharge and no tenderness (no axillary adenopathy). Left breast exhibits no inverted nipple, no mass, no nipple discharge and no tenderness (no axilarry adenopathy).  Abdominal: Soft. Bowel sounds are normal. There is no tenderness.  Musculoskeletal: She exhibits no edema or tenderness.  Lymphadenopathy:    She has no cervical adenopathy.  Neurological: She is alert and oriented to person, place, and time.  Skin: Skin is warm. No rash noted. No erythema.  Psychiatric: She has a normal mood and affect. Her behavior is normal.    BP 110/80 mmHg  Pulse 74  Temp(Src) 98.4 F (36.9 C) (Oral)  Resp 18  Ht 5\' 1"  (1.549 m)  Wt 144 lb 6 oz (65.488 kg)  BMI 27.29 kg/m2  SpO2 99% Wt Readings from Last 3 Encounters:  04/01/16 144 lb 6 oz (65.488 kg)  02/09/16 143 lb 8 oz (65.091 kg)  10/13/15 141 lb 8 oz (64.184 kg)     Lab Results  Component Value Date   WBC 8.1 01/14/2015   HGB 15.0 01/14/2015   HCT 44.7 01/14/2015   PLT 379.0 01/14/2015   GLUCOSE 75 01/14/2015   CHOL 181 01/14/2015   TRIG 83.0 01/14/2015   HDL 54.10 01/14/2015   LDLCALC 110* 01/14/2015   ALT 32 01/14/2015   AST 23 01/14/2015   NA 142 01/14/2015   K 5.1 01/14/2015   CL 110 01/14/2015   CREATININE 0.76 01/14/2015   BUN 14 01/14/2015   CO2 27 01/14/2015   TSH 2.69 02/09/2016       Assessment & Plan:   Problem List Items Addressed This Visit    Environmental allergies - Primary    Is concerned about possible wheat allergy.  See note.  Refer to an allergist.        Relevant Orders   Ambulatory referral to Allergy   CBC with Differential/Platelet   Health care maintenance    Physical today.  PAP 03/31/15 - negative with negative HPV.  Last mammogram in 2015.  Scheduled for mammogram.  She will find out of mother had colon polyps.       Hypothyroidism    Recent tsh and free T4 wnl.  Continue synthroid.       Relevant Medications    levothyroxine (SYNTHROID, LEVOTHROID) 88 MCG tablet   Other Relevant Orders   Lipid panel   Menopausal symptoms    Takes estroven.  Does not feel needs any further intervention.       Relevant Orders   Comprehensive metabolic panel    Other Visit Diagnoses    Breast cancer screening        Relevant Orders    MM DIGITAL SCREENING BILATERAL        [name], MD

## 2016-04-02 ENCOUNTER — Other Ambulatory Visit (INDEPENDENT_AMBULATORY_CARE_PROVIDER_SITE_OTHER): Payer: BC Managed Care – PPO

## 2016-04-02 ENCOUNTER — Encounter: Payer: Self-pay | Admitting: Internal Medicine

## 2016-04-02 DIAGNOSIS — Z9109 Other allergy status, other than to drugs and biological substances: Secondary | ICD-10-CM

## 2016-04-02 DIAGNOSIS — E039 Hypothyroidism, unspecified: Secondary | ICD-10-CM | POA: Diagnosis not present

## 2016-04-02 DIAGNOSIS — Z91048 Other nonmedicinal substance allergy status: Secondary | ICD-10-CM

## 2016-04-02 DIAGNOSIS — N951 Menopausal and female climacteric states: Secondary | ICD-10-CM | POA: Diagnosis not present

## 2016-04-02 LAB — CBC WITH DIFFERENTIAL/PLATELET
BASOS PCT: 0.4 % (ref 0.0–3.0)
Basophils Absolute: 0 10*3/uL (ref 0.0–0.1)
EOS ABS: 0.3 10*3/uL (ref 0.0–0.7)
Eosinophils Relative: 3.1 % (ref 0.0–5.0)
HEMATOCRIT: 43.5 % (ref 36.0–46.0)
HEMOGLOBIN: 14.9 g/dL (ref 12.0–15.0)
LYMPHS PCT: 29.4 % (ref 12.0–46.0)
Lymphs Abs: 2.5 10*3/uL (ref 0.7–4.0)
MCHC: 34.2 g/dL (ref 30.0–36.0)
MCV: 89.3 fl (ref 78.0–100.0)
MONO ABS: 0.6 10*3/uL (ref 0.1–1.0)
Monocytes Relative: 7.1 % (ref 3.0–12.0)
Neutro Abs: 5.1 10*3/uL (ref 1.4–7.7)
Neutrophils Relative %: 60 % (ref 43.0–77.0)
Platelets: 349 10*3/uL (ref 150.0–400.0)
RBC: 4.87 Mil/uL (ref 3.87–5.11)
RDW: 12.8 % (ref 11.5–15.5)
WBC: 8.6 10*3/uL (ref 4.0–10.5)

## 2016-04-02 LAB — LIPID PANEL
CHOLESTEROL: 189 mg/dL (ref 0–200)
HDL: 53.3 mg/dL (ref >39.00)
LDL CALC: 117 mg/dL — AB (ref 0–99)
NonHDL: 135.42
Total CHOL/HDL Ratio: 4
Triglycerides: 93 mg/dL (ref 0.0–149.0)
VLDL: 18.6 mg/dL (ref 0.0–40.0)

## 2016-04-02 LAB — COMPREHENSIVE METABOLIC PANEL
ALBUMIN: 4.2 g/dL (ref 3.5–5.2)
ALK PHOS: 62 U/L (ref 39–117)
ALT: 24 U/L (ref 0–35)
AST: 20 U/L (ref 0–37)
BILIRUBIN TOTAL: 0.6 mg/dL (ref 0.2–1.2)
BUN: 14 mg/dL (ref 6–23)
CALCIUM: 9.9 mg/dL (ref 8.4–10.5)
CHLORIDE: 105 meq/L (ref 96–112)
CO2: 27 mEq/L (ref 19–32)
CREATININE: 0.81 mg/dL (ref 0.40–1.20)
GFR: 80.19 mL/min (ref >60.00)
Glucose, Bld: 87 mg/dL (ref 70–99)
Potassium: 4 mEq/L (ref 3.5–5.1)
SODIUM: 139 meq/L (ref 135–145)
TOTAL PROTEIN: 7.2 g/dL (ref 6.0–8.3)

## 2016-04-05 NOTE — Telephone Encounter (Signed)
Order placed for referral to allergy.

## 2016-04-16 ENCOUNTER — Ambulatory Visit
Admission: RE | Admit: 2016-04-16 | Discharge: 2016-04-16 | Disposition: A | Discharge status: Home / Self Care | Payer: BC Managed Care – PPO | Source: Ambulatory Visit | Attending: Internal Medicine | Admitting: Internal Medicine

## 2016-04-16 DIAGNOSIS — Z1239 Encounter for other screening for malignant neoplasm of breast: Secondary | ICD-10-CM

## 2016-04-19 ENCOUNTER — Other Ambulatory Visit: Payer: Self-pay | Admitting: Internal Medicine

## 2016-04-30 ENCOUNTER — Ambulatory Visit
Admission: RE | Admit: 2016-04-30 | Discharge: 2016-04-30 | Disposition: A | Discharge status: Home / Self Care | Payer: BC Managed Care – PPO | Source: Ambulatory Visit | Attending: Internal Medicine | Admitting: Internal Medicine

## 2016-04-30 DIAGNOSIS — Z1231 Encounter for screening mammogram for malignant neoplasm of breast: Secondary | ICD-10-CM | POA: Insufficient documentation

## 2016-05-01 ENCOUNTER — Encounter: Payer: Self-pay | Admitting: Internal Medicine

## 2016-05-03 ENCOUNTER — Other Ambulatory Visit: Payer: Self-pay | Admitting: Internal Medicine

## 2016-05-03 DIAGNOSIS — N63 Unspecified lump in unspecified breast: Secondary | ICD-10-CM

## 2016-05-06 NOTE — Telephone Encounter (Signed)
Unread mychart message mailed to patient 

## 2016-05-17 ENCOUNTER — Ambulatory Visit
Admission: RE | Admit: 2016-05-17 | Discharge: 2016-05-17 | Disposition: A | Discharge status: Home / Self Care | Payer: BC Managed Care – PPO | Source: Ambulatory Visit | Attending: Internal Medicine | Admitting: Internal Medicine

## 2016-05-17 DIAGNOSIS — N63 Unspecified lump in unspecified breast: Secondary | ICD-10-CM

## 2016-05-17 DIAGNOSIS — R928 Other abnormal and inconclusive findings on diagnostic imaging of breast: Secondary | ICD-10-CM | POA: Diagnosis not present

## 2016-05-18 ENCOUNTER — Encounter: Payer: Self-pay | Admitting: Internal Medicine

## 2016-05-18 ENCOUNTER — Other Ambulatory Visit: Payer: Self-pay | Admitting: Internal Medicine

## 2016-05-18 DIAGNOSIS — R928 Other abnormal and inconclusive findings on diagnostic imaging of breast: Secondary | ICD-10-CM

## 2016-05-18 NOTE — Progress Notes (Signed)
Orders placed for f/u left breast mammogram and ultrasound (6 month f/u)

## 2016-06-14 ENCOUNTER — Telehealth: Payer: Self-pay | Admitting: *Deleted

## 2016-06-14 NOTE — Telephone Encounter (Signed)
Patient has requested to come in to see Dr. Nicki Reaper in about 1 month of so to discuss a mammogram result. Please give a time and date.

## 2016-06-14 NOTE — Telephone Encounter (Signed)
Please schedule her for an appt on 06/25/16 at 2:30.  Thanks

## 2016-06-15 NOTE — Telephone Encounter (Signed)
Pt couldn't do this day so I scheduled her for July 12 @ 1pm..

## 2016-06-16 ENCOUNTER — Encounter: Payer: Self-pay | Admitting: Internal Medicine

## 2016-07-07 ENCOUNTER — Ambulatory Visit (INDEPENDENT_AMBULATORY_CARE_PROVIDER_SITE_OTHER): Payer: BC Managed Care – PPO | Admitting: Internal Medicine

## 2016-07-07 ENCOUNTER — Encounter: Payer: Self-pay | Admitting: General Surgery

## 2016-07-07 VITALS — BP 120/70 | HR 84 | Temp 98.5°F | Resp 18 | Ht 61.0 in | Wt 134.0 lb

## 2016-07-07 DIAGNOSIS — R928 Other abnormal and inconclusive findings on diagnostic imaging of breast: Secondary | ICD-10-CM | POA: Insufficient documentation

## 2016-07-07 DIAGNOSIS — M25562 Pain in left knee: Secondary | ICD-10-CM | POA: Diagnosis not present

## 2016-07-07 NOTE — Progress Notes (Signed)
Pre-visit discussion using our clinic review tool. No additional management support is needed unless otherwise documented below in the visit note.  

## 2016-07-07 NOTE — Progress Notes (Signed)
Patient ID: Angela Pacheco, female   DOB: Nov 24, 1966, 50 y.o.   MRN: [id number]   Subjective:    Patient ID: Angela Pacheco, female    DOB: 05/07/1966, 50 y.o.   MRN: [id number]  HPI  Patient here for a scheduled follow up.  Wanted to f/u regarding her recent mammogram and for f/u breast exam.  She had mammogram 04/30/16 - Birads 0.   She had f/u left breast mammogram and ultrasound 05/17/16.   Radiology read as probably benign - Birads III.  She is here to discuss.  She reports no change in her breast.  No palpable lump.  No nipple discharge.  She is recovering from knee surgery.  Brace in place.  Going to therapy.     Past Medical History  Diagnosis Date  . Allergy     cats trigger  . Thyroid disease   . Asthma     mild   Past Surgical History  Procedure Laterality Date  . Abdominal hysterectomy      cervix left in place  . Cholecystectomy    . Appendectomy  1991   Family History  Problem Relation Age of Onset  . Arthritis Mother    Social History   Social History  . Marital Status: Married    Spouse Name: N/A  . Number of Children: N/A  . Years of Education: N/A   Social History Main Topics  . Smoking status: Never Smoker   . Smokeless tobacco: Never Used  . Alcohol Use: 0.0 oz/week    0 Standard drinks or equivalent per week     Comment: occ.  . Drug Use: No  . Sexual Activity: Not Asked   Other Topics Concern  . None   Social History Narrative    Outpatient Encounter Prescriptions as of 07/07/2016  Medication Sig  . fluticasone (FLONASE) 50 MCG/ACT nasal spray Place 2 sprays into both nostrils daily.  Marland Kitchen levothyroxine (SYNTHROID, LEVOTHROID) 88 MCG tablet Take 1 tablet (88 mcg total) by mouth daily.  Marland Kitchen levothyroxine (SYNTHROID, LEVOTHROID) 88 MCG tablet TAKE 1 TABLET(88 MCG) BY MOUTH DAILY  . loratadine-pseudoephedrine (CLARITIN-D 24-HOUR) 10-240 MG per 24 hr tablet Take 1 tablet by mouth daily.  . meloxicam (MOBIC) 15 MG tablet TAKE 1 TABLET(15  MG) BY MOUTH DAILY  . Specialty Vitamins Products (MENOPAUSE SUPPORT PO) Take by mouth.   No facility-administered encounter medications on file as of 07/07/2016.    Review of Systems  Constitutional: Negative for appetite change, fatigue and unexpected weight change.  Respiratory: Negative for cough, chest tightness and shortness of breath.   Cardiovascular: Negative for chest pain and palpitations.  Gastrointestinal: Negative for nausea, vomiting and diarrhea.  Musculoskeletal:       S/p recent knee surgery.  Going to therapy.  Doing well.   Skin: Negative for color change and rash.       Objective:    Physical Exam  Constitutional: She appears well-developed and well-nourished. No distress.  Neck: Neck supple. No thyromegaly present.  Cardiovascular: Normal rate and regular rhythm.   Pulmonary/Chest: Breath sounds normal. No accessory muscle usage. No tachypnea. No respiratory distress. She has no decreased breath sounds. She has no wheezes. She has no rhonchi. Right breast exhibits no inverted nipple, no mass, no nipple discharge and no tenderness (no axillary adenopathy). Left breast exhibits no inverted nipple, no mass, no nipple discharge and no tenderness (no axilarry adenopathy).  Abdominal: Soft. Bowel sounds are normal. There is no tenderness.  Musculoskeletal:  She exhibits no edema or tenderness.  Lymphadenopathy:    She has no cervical adenopathy.  Skin: Skin is warm. No rash noted.    BP 120/70 mmHg  Pulse 84  Temp(Src) 98.5 F (36.9 C) (Oral)  Resp 18  Ht 5\' 1"  (1.549 m)  Wt 134 lb (60.782 kg)  BMI 25.33 kg/m2  SpO2 98% Wt Readings from Last 3 Encounters:  07/07/16 134 lb (60.782 kg)  04/01/16 144 lb 6 oz (65.488 kg)  02/09/16 143 lb 8 oz (65.091 kg)     Lab Results  Component Value Date   WBC 8.6 04/02/2016   HGB 14.9 04/02/2016   HCT 43.5 04/02/2016   PLT 349.0 04/02/2016   GLUCOSE 87 04/02/2016   CHOL 189 04/02/2016   TRIG 93.0 04/02/2016   HDL  53.30 04/02/2016   LDLCALC 117* 04/02/2016   ALT 24 04/02/2016   AST 20 04/02/2016   NA 139 04/02/2016   K 4.0 04/02/2016   CL 105 04/02/2016   CREATININE 0.81 04/02/2016   BUN 14 04/02/2016   CO2 27 04/02/2016   TSH 2.69 02/09/2016    US Breast [place]  05/17/2016  CLINICAL DATA:  Callback from screening mammogram EXAM: 2D DIGITAL DIAGNOSTIC LEFT MAMMOGRAM WITH CAD AND ADJUNCT TOMO ULTRASOUND LEFT BREAST COMPARISON:  Previous exam(s). ACR Breast Density Category c: The breast tissue is heterogeneously dense, which may obscure small masses. FINDINGS: CC, MLO, and full lateral views of the left breast with tomosynthesis were performed. On the CC view, there is an oval, circumscribed, approximately 9 mm mass within the lateral left breast, posterior depth, corresponding to the finding seen on screening mammogram. This area of the breast may not have been included on prior mammograms due to its posterior depth. No definite correlate is identified on the MLO or full lateral views. Mammographic images were processed with CAD. On physical exam, no discrete mass is felt in the area of concern within the lateral left breast. Targeted ultrasound is performed, showing an oval, circumscribed, isoechoic mass at 3 o'clock, 10 cm from the nipple measuring 8 x 5 x 6 mm which may correspond to the mammographic finding. A second similar-appearing oval, circumscribed, isoechoic mass is identified at 3 o'clock, 12 cm from nipple measuring 14 x 4 x 6 mm. 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 No suspicious sonographic finding is seen within the area of concern within the lateral left breast. IMPRESSION: Probably benign left breast findings. RECOMMENDATION: Left diagnostic mammogram and ultrasound in 6 months. Options including short-term follow-up versus ultrasound-guided left breast biopsy were discussed with the patient and her husband. The patient wishes to pursue short-term  follow-up at this time. I have discussed the findings and recommendations with the patient. Results were also provided in writing at the conclusion of the visit. If applicable, a reminder letter will be sent to the patient regarding the next appointment. BI-RADS CATEGORY  3: Probably benign. Electronically Signed   By: Pamelia Hoit M.D.   On: 05/17/2016 16:35   Mm Diag Breast Tomo Uni Left  05/17/2016  CLINICAL DATA:  Callback from screening mammogram EXAM: 2D DIGITAL DIAGNOSTIC LEFT MAMMOGRAM WITH CAD AND ADJUNCT TOMO ULTRASOUND LEFT BREAST COMPARISON:  Previous exam(s). ACR Breast Density Category c: The breast tissue is heterogeneously dense, which may obscure small masses. FINDINGS: CC, MLO, and full lateral views of the left breast with tomosynthesis were performed. On the CC view, there is an oval, circumscribed, approximately  9 mm mass within the lateral left breast, posterior depth, corresponding to the finding seen on screening mammogram. This area of the breast may not have been included on prior mammograms due to its posterior depth. No definite correlate is identified on the MLO or full lateral views. Mammographic images were processed with CAD. On physical exam, no discrete mass is felt in the area of concern within the lateral left breast. Targeted ultrasound is performed, showing an oval, circumscribed, isoechoic mass at 3 o'clock, 10 cm from the nipple measuring 8 x 5 x 6 mm which may correspond to the mammographic finding. A second similar-appearing oval, circumscribed, isoechoic mass is identified at 3 o'clock, 12 cm from nipple measuring 14 x 4 x 6 mm. 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 No suspicious sonographic finding is seen within the area of concern within the lateral left breast. IMPRESSION: Probably benign left breast findings. RECOMMENDATION: Left diagnostic mammogram and ultrasound in 6 months. Options including short-term follow-up versus  ultrasound-guided left breast biopsy were discussed with the patient and her husband. The patient wishes to pursue short-term follow-up at this time. I have discussed the findings and recommendations with the patient. Results were also provided in writing at the conclusion of the visit. If applicable, a reminder letter will be sent to the patient regarding the next appointment. BI-RADS CATEGORY  3: Probably benign. Electronically Signed   By: Pamelia Hoit M.D.   On: 05/17/2016 16:35       Assessment & Plan:   Problem List Items Addressed This Visit    Abnormal mammogram - Primary    Mammogram and ultrasound reviewed.  Radiology felt probably benign.  Exam as outlined.  Will refer to Dr Bary Castilla for evaluation and opinion about further w/u - 6 month f/u vs biopsy.        Relevant Orders   Ambulatory referral to General Surgery   Knee pain, left    S/p surgery.  Brace in place.  Physical therapy.  Followed by ortho.           [name], MD

## 2016-07-08 ENCOUNTER — Encounter: Payer: Self-pay | Admitting: Internal Medicine

## 2016-07-08 DIAGNOSIS — M25562 Pain in left knee: Secondary | ICD-10-CM | POA: Insufficient documentation

## 2016-07-08 NOTE — Assessment & Plan Note (Signed)
S/p surgery.  Brace in place.  Physical therapy.  Followed by ortho.

## 2016-07-08 NOTE — Assessment & Plan Note (Signed)
Mammogram and ultrasound reviewed.  Radiology felt probably benign.  Exam as outlined.  Will refer to Dr Bary Castilla for evaluation and opinion about further w/u - 6 month f/u vs biopsy.

## 2016-07-20 ENCOUNTER — Ambulatory Visit: Payer: Self-pay | Admitting: General Surgery

## 2016-07-28 ENCOUNTER — Ambulatory Visit: Payer: Self-pay | Admitting: General Surgery

## 2016-08-25 ENCOUNTER — Ambulatory Visit: Payer: Self-pay | Admitting: General Surgery

## 2016-09-08 ENCOUNTER — Encounter: Payer: Self-pay | Admitting: Internal Medicine

## 2016-09-27 ENCOUNTER — Ambulatory Visit (INDEPENDENT_AMBULATORY_CARE_PROVIDER_SITE_OTHER): Payer: BC Managed Care – PPO | Admitting: Orthopaedic Surgery

## 2016-09-28 ENCOUNTER — Encounter: Payer: Self-pay | Admitting: *Deleted

## 2016-10-06 ENCOUNTER — Ambulatory Visit: Payer: BC Managed Care – PPO | Admitting: Internal Medicine

## 2016-11-08 ENCOUNTER — Ambulatory Visit (INDEPENDENT_AMBULATORY_CARE_PROVIDER_SITE_OTHER): Payer: BC Managed Care – PPO | Admitting: Orthopaedic Surgery

## 2016-11-19 ENCOUNTER — Other Ambulatory Visit: Payer: BC Managed Care – PPO

## 2016-11-19 ENCOUNTER — Ambulatory Visit: Payer: BC Managed Care – PPO

## 2016-11-22 ENCOUNTER — Other Ambulatory Visit: Payer: Self-pay | Admitting: Internal Medicine

## 2016-11-22 ENCOUNTER — Ambulatory Visit
Admission: RE | Admit: 2016-11-22 | Discharge: 2016-11-22 | Disposition: A | Discharge status: Home / Self Care | Payer: BC Managed Care – PPO | Source: Ambulatory Visit | Attending: Internal Medicine | Admitting: Internal Medicine

## 2016-11-22 ENCOUNTER — Ambulatory Visit (INDEPENDENT_AMBULATORY_CARE_PROVIDER_SITE_OTHER): Payer: BC Managed Care – PPO | Admitting: Orthopaedic Surgery

## 2016-11-22 DIAGNOSIS — R928 Other abnormal and inconclusive findings on diagnostic imaging of breast: Secondary | ICD-10-CM | POA: Diagnosis present

## 2016-11-23 ENCOUNTER — Other Ambulatory Visit: Payer: Self-pay | Admitting: Internal Medicine

## 2016-11-23 DIAGNOSIS — R7989 Other specified abnormal findings of blood chemistry: Secondary | ICD-10-CM

## 2016-11-23 NOTE — Progress Notes (Signed)
Orders placed for f/u labs.  

## 2016-11-25 ENCOUNTER — Other Ambulatory Visit: Payer: Self-pay | Admitting: Internal Medicine

## 2016-11-25 DIAGNOSIS — R928 Other abnormal and inconclusive findings on diagnostic imaging of breast: Secondary | ICD-10-CM

## 2016-11-25 NOTE — Progress Notes (Signed)
Orders placed for f/u bilateral mammogram and ultrasound.

## 2016-11-26 ENCOUNTER — Encounter (INDEPENDENT_AMBULATORY_CARE_PROVIDER_SITE_OTHER): Payer: Self-pay | Admitting: Orthopaedic Surgery

## 2016-11-26 ENCOUNTER — Ambulatory Visit (INDEPENDENT_AMBULATORY_CARE_PROVIDER_SITE_OTHER): Payer: Worker's Compensation | Admitting: Orthopaedic Surgery

## 2016-11-26 VITALS — BP 99/78 | HR 84 | Resp 12 | Ht 61.0 in | Wt 135.0 lb

## 2016-11-26 DIAGNOSIS — S83512D Sprain of anterior cruciate ligament of left knee, subsequent encounter: Secondary | ICD-10-CM

## 2016-11-26 NOTE — Progress Notes (Signed)
Office Visit Note   Patient: Angela Pacheco           Date of Birth: December 23, 1966           MRN: [id number] Visit Date: 11/26/2016              Requested by: [name], MD [id number] [place], [place] [address] PCP: [name], MD   Assessment & Plan: Visit Diagnoses: No diagnosis found. Angela Pacheco has definitely improved over the past several months with increased exercise. I think that's going to make a difference over time. She is taking less Aleve and laughing less trouble with her knee and she was several months ago. She still has some trouble with the cold weather and at the end of the day with generalized achiness. Hopefully that will resolve over time with continued exercises. I will plan to see her back in 3 months. We'll give her a note saying that she can work full time full duty without restrictions. She has not yet reached maximal medical improvement.  Plan: f/u 3 months  Follow-Up Instructions: No Follow-up on file.   Orders:  No orders of the defined types were placed in this encounter.  No orders of the defined types were placed in this encounter.     Procedures: No procedures performed   Clinical Data: No additional findings.   Subjective: No chief complaint on file.   Post op Left knee ACL Reconstruction 06/03/16, improving, stronger, more balanced, limited, Aleve PRN, Tens unit, heat, has been to PT, uses exercise bike.  She relates that she has finished her course of therapy with Angela Pacheco's and Angela Pacheco. has purchased an exercise bicycle for her. She'll ride it 4-5 times a week for up to 30 minutes. She definitely feels better in terms of her left knee with  less achiness. Her left knee does not give way nor does it "swell". She feels like she's made excellent progress over the last 2 months.  Review of Systems   Objective: Vital Signs: BP 99/78 (BP Location: Left Arm, Patient Position: Sitting, Cuff Size: Large)    Pulse 84   Resp 12   Ht 5\' 1"  (1.549 m)   Wt 135 lb (61.2 kg)   BMI 25.51 kg/m   Physical Exam  Ortho Exam left knee exam reveals no evidence of instability with an anterior drawer sign or Lachman's test. Was no effusion. There is no opening with varus or valgus stress. There was 122 of knee flexion and full extension there was no popping or clicking. There is no localized area of tenderness. No swelling distally neurovascular exam was intact.  Specialty Comments:  No specialty comments available.  Imaging: No results found.   PMFS History: Patient Active Problem List   Diagnosis Date Noted  . Knee pain, left 07/08/2016  . Abnormal mammogram 07/07/2016  . Trapezius strain 02/11/2016  . Overweight 02/11/2016  . Head injury 10/13/2015  . Health care maintenance 04/05/2015  . Environmental allergies 01/17/2015  . Asthma 01/17/2015  . Menopausal symptoms 01/17/2015  . Endometriosis 01/17/2015  . Hypothyroidism 01/14/2015   Past Medical History:  Diagnosis Date  . Allergy    cats trigger  . Asthma    mild  . Thyroid disease     Family History  Problem Relation Age of Onset  . Arthritis Mother     Past Surgical History:  Procedure Laterality Date  . ABDOMINAL HYSTERECTOMY     cervix left in place  .  APPENDECTOMY  1991  . CHOLECYSTECTOMY     Social History   Occupational History  . Not on file.   Social History Main Topics  . Smoking status: Never Smoker  . Smokeless tobacco: Never Used  . Alcohol use 0.0 oz/week     Comment: occ.  . Drug use: No  . Sexual activity: Not on file

## 2016-11-29 ENCOUNTER — Ambulatory Visit (INDEPENDENT_AMBULATORY_CARE_PROVIDER_SITE_OTHER): Payer: BC Managed Care – PPO | Admitting: Family

## 2016-11-29 ENCOUNTER — Encounter: Payer: Self-pay | Admitting: Family

## 2016-11-29 VITALS — BP 116/74 | HR 79 | Temp 98.6°F | Ht 61.0 in | Wt 142.2 lb

## 2016-11-29 DIAGNOSIS — R0981 Nasal congestion: Secondary | ICD-10-CM | POA: Diagnosis not present

## 2016-11-29 MED ORDER — AMOXICILLIN 500 MG PO CAPS: 500.0000 mg | ORAL_CAPSULE | Freq: Two times a day (BID) | ORAL | 0 refills | Status: DC

## 2016-11-29 NOTE — Progress Notes (Signed)
Subjective:    Patient ID: Angela Pacheco, female    DOB: 1966-03-31, 50 y.o.   MRN: [id number]  CC: Angela Pacheco is a 50 y.o. female who presents today for an acute visit.    HPI: CC: thick sinus congestion for 10 days, improving. Hoarseness and 'achiniess' improved. Endorses right sided facial pressure, cough. No fever, chills. Has been using sinus rinse, delsym with some relief. H/o seasonal allergies.       HISTORY:  Past Medical History:  Diagnosis Date  . Allergy    cats trigger  . Asthma    mild  . Thyroid disease    Past Surgical History:  Procedure Laterality Date  . ABDOMINAL HYSTERECTOMY     cervix left in place  . APPENDECTOMY  1991  . CHOLECYSTECTOMY     Family History  Problem Relation Age of Onset  . Arthritis Mother     Allergies: Patient has no known allergies. Current Outpatient Prescriptions on File Prior to Visit  Medication Sig Dispense Refill  . DYMISTA 137-50 MCG/ACT SUSP     . levocetirizine (XYZAL) 5 MG tablet     . levothyroxine (SYNTHROID, LEVOTHROID) 88 MCG tablet Take 1 tablet (88 mcg total) by mouth daily. 30 tablet 11   No current facility-administered medications on file prior to visit.     Social History  Substance Use Topics  . Smoking status: Never Smoker  . Smokeless tobacco: Never Used  . Alcohol use 0.0 oz/week     Comment: occ.    Review of Systems  Constitutional: Negative for chills and fever.  HENT: Positive for congestion, sinus pressure and voice change. Negative for ear discharge, ear pain and sore throat.   Respiratory: Positive for cough. Negative for shortness of breath and wheezing.   Cardiovascular: Negative for chest pain and palpitations.  Gastrointestinal: Negative for nausea and vomiting.  Neurological: Negative for headaches.      Objective:    BP 116/74   Pulse 79   Temp 98.6 F (37 C) (Oral)   Ht 5\' 1"  (1.549 m)   Wt 142 lb 3.2 oz (64.5 kg)   SpO2 99%   BMI 26.87 kg/m     Physical Exam  Constitutional: She appears well-developed and well-nourished.  HENT:  Head: Normocephalic and atraumatic.  Right Ear: Hearing, tympanic membrane, external ear and ear canal normal. No drainage, swelling or tenderness. No foreign bodies. Tympanic membrane is not erythematous and not bulging. No middle ear effusion. No decreased hearing is noted.  Left Ear: Hearing, tympanic membrane, external ear and ear canal normal. No drainage, swelling or tenderness. No foreign bodies. Tympanic membrane is not erythematous and not bulging.  No middle ear effusion. No decreased hearing is noted.  Nose: Rhinorrhea present. Right sinus exhibits no maxillary sinus tenderness and no frontal sinus tenderness. Left sinus exhibits no maxillary sinus tenderness and no frontal sinus tenderness.  Mouth/Throat: Uvula is midline, oropharynx is clear and moist and mucous membranes are normal. No oropharyngeal exudate, posterior oropharyngeal edema, posterior oropharyngeal erythema or tonsillar abscesses.  Eyes: Conjunctivae are normal.  Cardiovascular: Regular rhythm, normal heart sounds and normal pulses.   Pulmonary/Chest: Effort normal and breath sounds normal. She has no wheezes. She has no rhonchi. She has no rales.  Lymphadenopathy:       Head (right side): No submental, no submandibular, no tonsillar, no preauricular, no posterior auricular and no occipital adenopathy present.       Head (left side): No submental,  no submandibular, no tonsillar, no preauricular, no posterior auricular and no occipital adenopathy present.    She has no cervical adenopathy.  Neurological: She is alert.  Skin: Skin is warm and dry.  Psychiatric: She has a normal mood and affect. Her speech is normal and behavior is normal. Thought content normal.  Vitals reviewed.      Assessment & Plan:   1. Sinus congestion Improving. Suspect viral sinusitis. Advised to delay filling antibiotic. Return precautions given.  -  amoxicillin (AMOXIL) 500 MG capsule; Take 1 capsule (500 mg total) by mouth 2 (two) times daily.  Dispense: 14 capsule; Refill: 0    I have discontinued Angela Pacheco's loratadine-pseudoephedrine, fluticasone, Specialty Vitamins Products (MENOPAUSE SUPPORT PO), meloxicam, and azelastine. I am also having her start on amoxicillin. Additionally, I am having her maintain her levothyroxine, DYMISTA, and levocetirizine.   Meds ordered this encounter  Medications  . amoxicillin (AMOXIL) 500 MG capsule    Sig: Take 1 capsule (500 mg total) by mouth 2 (two) times daily.    Dispense:  14 capsule    Refill:  0    Order Specific Question:   Supervising Provider    Answer:   Crecencio Mc [[id number]]    Return precautions given.   Risks, benefits, and alternatives of the medications and treatment plan prescribed today were discussed, and patient expressed understanding.   Education regarding symptom management and diagnosis given to patient on AVS.  Continue to follow with Angela Pheasant, MD for routine health maintenance.   Angela Pacheco and I agreed with plan.   Angela Paris, FNP

## 2016-11-29 NOTE — Progress Notes (Signed)
Pre visit review using our clinic review tool, if applicable. No additional management support is needed unless otherwise documented below in the visit note. 

## 2016-11-29 NOTE — Patient Instructions (Signed)
I suspect that your infection is viral in nature.  As discussed, I advise that you wait to fill the antibiotic after 1-2 days of symptom management to see if your symptoms improve. If you do not show improvement, you may take the antibiotic as prescribed.  No more decongestants.    Increase intake of clear fluids. Congestion is best treated by hydration, when mucus is wetter, it is thinner, less sticky, and easier to expel from the body, either through coughing up drainage, or by blowing your nose.   Get plenty of rest.   Use saline nasal drops and blow your nose frequently. Run a humidifier at night and elevate the head of the bed. Vicks Vapor rub will help with congestion and cough. Steam showers and sinus massage for congestion.   Use Acetaminophen or Ibuprofen as needed for fever or pain. Avoid second hand smoke. Even the smallest exposure will worsen symptoms.   Over the counter medications you can try include Delsym for cough, a decongestant for congestion, and Mucinex or Robitussin as an expectorant. Be sure to just get the plain Mucinex or Robitussin that just has one medication (Guaifenesen). We don't recommend the combination products. Note, be sure to drink two glasses of water with each dose of Mucinex as the medication will not work well without adequate hydration.   You can also try a teaspoon of honey to see if this will help reduce cough. Throat lozenges can sometimes be beneficial as well.    This illness will typically last 7 - 10 days.   Please follow up with our clinic if you develop a fever greater than 101 F, symptoms worsen, or do not resolve in the next week.

## 2016-12-28 ENCOUNTER — Encounter: Payer: Self-pay | Admitting: Internal Medicine

## 2017-02-25 ENCOUNTER — Encounter: Payer: Self-pay | Admitting: Family Medicine

## 2017-02-25 ENCOUNTER — Ambulatory Visit (INDEPENDENT_AMBULATORY_CARE_PROVIDER_SITE_OTHER): Payer: BC Managed Care – PPO | Admitting: Orthopaedic Surgery

## 2017-02-25 ENCOUNTER — Ambulatory Visit (INDEPENDENT_AMBULATORY_CARE_PROVIDER_SITE_OTHER): Payer: BC Managed Care – PPO | Admitting: Family Medicine

## 2017-02-25 VITALS — BP 108/75 | HR 94 | Temp 100.4°F | Wt 135.8 lb

## 2017-02-25 DIAGNOSIS — J111 Influenza due to unidentified influenza virus with other respiratory manifestations: Secondary | ICD-10-CM | POA: Diagnosis not present

## 2017-02-25 LAB — POC INFLUENZA A&B (BINAX/QUICKVUE)
Influenza A, POC: NEGATIVE
Influenza B, POC: NEGATIVE

## 2017-02-25 MED ORDER — OSELTAMIVIR PHOSPHATE 75 MG PO CAPS: 75.0000 mg | ORAL_CAPSULE | Freq: Two times a day (BID) | ORAL | 0 refills | Status: DC

## 2017-02-25 NOTE — Patient Instructions (Signed)
Your test was negative.  These tests are not perfect and you clinically appear to have the flu.  Start the treatment as soon as possible.  Let us know if you worsen or fail to improve.  Take care  Dr. Lacinda Axon

## 2017-02-25 NOTE — Progress Notes (Signed)
Subjective:  Patient ID: Angela Pacheco, female    DOB: 11/07/1966  Age: 51 y.o. MRN: [id number]  CC: Flu symptoms  HPI:  51 year old female presents with concerns for influenza.  Patient states that her symptoms started on Monday. She's had fever, bodyaches, chills, nausea, cough. She's also had some associated head and ear pressure. Symptoms are severe. She's been using over-the-counter Tylenol and Aleve without significant improvement. 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 No sick contacts at home. No known exacerbating factors. She did receive a flu vaccine this year. No other complaints or concerns at this time.  Social Hx   Social History   Social History  . Marital status: Married    Spouse name: N/A  . Number of children: N/A  . Years of education: N/A   Social History Main Topics  . Smoking status: Never Smoker  . Smokeless tobacco: Never Used  . Alcohol use 0.0 oz/week     Comment: occ.  . Drug use: No  . Sexual activity: Not Asked   Other Topics Concern  . None   Social History Narrative  . None    Review of Systems  Constitutional: Positive for chills and fever.  HENT: Positive for congestion.   Respiratory: Positive for cough.   Gastrointestinal: Positive for nausea.  Musculoskeletal:       Body aches.   Objective:  BP 108/75   Pulse 94   Temp (!) 100.4 F (38 C) (Oral)   Wt 135 lb 12.8 oz (61.6 kg)   SpO2 98%   BMI 25.66 kg/m   BP/Weight 02/25/2017 11/29/2016 [id number]  Systolic BP 123XX123 [id number] 99  Diastolic BP 75 74 78  Wt. (Lbs) 135.8 142.2 135  BMI 25.66 26.87 25.51   Physical Exam  Constitutional: She is oriented to person, place, and time. She appears well-developed. No distress.  HENT:  Oropharynx with erythema. Normal TMs bilaterally.  Cardiovascular: Normal rate and regular rhythm.   Pulmonary/Chest: Effort normal and breath sounds normal.  Neurological: She is alert and oriented to person,  place, and time.  Psychiatric: She has a normal mood and affect.  Vitals reviewed.  Lab Results  Component Value Date   WBC 8.6 04/02/2016   HGB 14.9 04/02/2016   HCT 43.5 04/02/2016   PLT 349.0 04/02/2016   GLUCOSE 87 04/02/2016   CHOL 189 04/02/2016   TRIG 93.0 04/02/2016   HDL 53.30 04/02/2016   LDLCALC 117 (H) 04/02/2016   ALT 24 04/02/2016   AST 20 04/02/2016   NA 139 04/02/2016   K 4.0 04/02/2016   CL 105 04/02/2016   CREATININE 0.81 04/02/2016   BUN 14 04/02/2016   CO2 27 04/02/2016   TSH 2.69 02/09/2016    Assessment & Plan:   Problem List Items Addressed This Visit    Influenza - Primary    New problem. Clinically appears to have influenza. Her testing was negative however. Given clinical suspicion and lower sensitivity of these rapid test, will treat empirically with Tamiflu.      Relevant Medications   oseltamivir (TAMIFLU) 75 MG capsule   Other Relevant Orders   POC Influenza A&B(BINAX/QUICKVUE) (Completed)      Meds ordered this encounter  Medications  . oseltamivir (TAMIFLU) 75 MG capsule    Sig: Take 1 capsule (75 mg total) by mouth 2 (two) times daily.    Dispense:  10 capsule    Refill:  0   Follow-up: PRN  Genevie Elman  [place]

## 2017-02-25 NOTE — Assessment & Plan Note (Signed)
New problem. Clinically appears to have influenza. Her testing was negative however. Given clinical suspicion and lower sensitivity of these rapid test, will treat empirically with Tamiflu.

## 2017-02-25 NOTE — Progress Notes (Signed)
Pre visit review using our clinic review tool, if applicable. No additional management support is needed unless otherwise documented below in the visit note. 

## 2017-02-27 ENCOUNTER — Ambulatory Visit
Admission: EM | Admit: 2017-02-27 | Discharge: 2017-02-27 | Disposition: A | Discharge status: Home / Self Care | Payer: BC Managed Care – PPO | Attending: Family Medicine | Admitting: Family Medicine

## 2017-02-27 DIAGNOSIS — H6981 Other specified disorders of Eustachian tube, right ear: Secondary | ICD-10-CM | POA: Diagnosis not present

## 2017-02-27 DIAGNOSIS — J029 Acute pharyngitis, unspecified: Secondary | ICD-10-CM | POA: Diagnosis not present

## 2017-02-27 LAB — RAPID STREP SCREEN (MED CTR MEBANE ONLY): STREPTOCOCCUS, GROUP A SCREEN (DIRECT): NEGATIVE

## 2017-02-27 MED ORDER — FLUTICASONE PROPIONATE 50 MCG/ACT NA SUSP: 2.0000 | Freq: Every day | NASAL | 0 refills | Status: DC

## 2017-02-27 MED ORDER — AMOXICILLIN 875 MG PO TABS: 875.0000 mg | ORAL_TABLET | Freq: Two times a day (BID) | ORAL | 0 refills | Status: DC

## 2017-02-27 NOTE — ED Provider Notes (Signed)
[facility]    CSN: [id number] Arrival date & time: 02/27/17  0801     History   Chief Complaint Chief Complaint  Patient presents with  . Sore Throat  . Otalgia    Right    HPI Angela Pacheco is a 51 y.o. female.   History of because of sore throat. She states that she was seen Friday for the flu. Was placed on Tamiflu which has made a difference. She states that yesterday morning she woke up she felt wonderful because they progressed she started having pain on the right side of her throat and right ear as well. She feels that she has secondary infection recess with the television told her this morning when she called. She's not had fever and she still taken a Tamiflu. She does not smoke and his no known drug allergies. Mother has arthritis. She's had abdominal hysterectomy appendectomy and cholecystectomy before in the past.she has a history of thyroiditis and hypothyroidism   The history is provided by the patient. No language interpreter was used.  Sore Throat  This is a new problem. The current episode started yesterday. The problem occurs constantly. The problem has not changed since onset.Pertinent negatives include no chest pain, no abdominal pain, no headaches and no shortness of breath. Nothing aggravates the symptoms. Nothing relieves the symptoms. She has tried nothing for the symptoms. The treatment provided no relief.  Otalgia  Associated symptoms: no abdominal pain and no headaches     Past Medical History:  Diagnosis Date  . Allergy    cats trigger  . Asthma    mild  . Thyroid disease     Patient Active Problem List   Diagnosis Date Noted  . Influenza 02/25/2017  . Abnormal mammogram 07/07/2016  . Overweight 02/11/2016  . Health care maintenance 04/05/2015  . Environmental allergies 01/17/2015  . Asthma 01/17/2015  . Menopausal symptoms 01/17/2015  . Endometriosis 01/17/2015  . Hypothyroidism 01/14/2015    Past Surgical History:    Procedure Laterality Date  . ABDOMINAL HYSTERECTOMY     cervix left in place  . APPENDECTOMY  1991  . CHOLECYSTECTOMY      OB History    No data available       Home Medications    Prior to Admission medications   Medication Sig Start Date End Date Taking? Authorizing Provider  levothyroxine (SYNTHROID, LEVOTHROID) 88 MCG tablet Take 1 tablet (88 mcg total) by mouth daily. 04/01/16  Yes [name], MD  loratadine (CLARITIN) 10 MG tablet Take 10 mg by mouth daily.   Yes Historical Provider, MD  oseltamivir (TAMIFLU) 75 MG capsule Take 1 capsule (75 mg total) by mouth 2 (two) times daily. 02/25/17  Yes Coral Spikes, DO  amoxicillin (AMOXIL) 875 MG tablet Take 1 tablet (875 mg total) by mouth 2 (two) times daily. 02/27/17   Frederich Cha, MD  DYMISTA 137-50 MCG/ACT SUSP  08/26/16   Historical Provider, MD  fluticasone Asencion Islam) 50 MCG/ACT nasal spray Place 2 sprays into both nostrils daily. 02/27/17   Frederich Cha, MD  levocetirizine Harlow Ohms) 5 MG tablet  08/26/16   Historical Provider, MD    Family History Family History  Problem Relation Age of Onset  . Arthritis Mother     Social History Social History  Substance Use Topics  . Smoking status: Never Smoker  . Smokeless tobacco: Never Used  . Alcohol use 0.0 oz/week     Comment: occ.     Allergies  Patient has no known allergies.   Review of Systems Review of Systems  HENT: Positive for ear pain.   Respiratory: Negative for shortness of breath.   Cardiovascular: Negative for chest pain.  Gastrointestinal: Negative for abdominal pain.  Neurological: Negative for headaches.  All other systems reviewed and are negative.    Physical Exam Triage Vital Signs ED Triage Vitals  Enc Vitals Group     BP 02/27/17 0818 94/70     Pulse Rate 02/27/17 0818 84     Resp 02/27/17 0818 18     Temp 02/27/17 0818 98.7 F (37.1 C)     Temp Source 02/27/17 0818 Oral     SpO2 02/27/17 0818 100 %     Weight 02/27/17 0818 135 lb  (61.2 kg)     Height --      Head Circumference --      Peak Flow --      Pain Score 02/27/17 0820 4     Pain Loc --      Pain Edu? --      Excl. in [place]? --    No data found.   Updated Vital Signs BP 94/70 (BP Location: Left Arm)   Pulse 84   Temp 98.7 F (37.1 C) (Oral)   Resp 18   Wt 135 lb (61.2 kg)   SpO2 100%   BMI 25.51 kg/m   Visual Acuity Right Eye Distance:   Left Eye Distance:   Bilateral Distance:    Right Eye Near:   Left Eye Near:    Bilateral Near:     Physical Exam  Constitutional: She appears well-developed and well-nourished.  HENT:  Head: Normocephalic and atraumatic.  Right Ear: Hearing, tympanic membrane, external ear and ear canal normal.  Left Ear: Hearing, tympanic membrane, external ear and ear canal normal.  Nose: Rhinorrhea present. No mucosal edema.  Mouth/Throat: Uvula is midline. Posterior oropharyngeal erythema present.  Eyes: Pupils are equal, round, and reactive to light.  Neck: Normal range of motion. Neck supple.    She has tenderness along the right side of her neck may have some swollen lymph nodes we'll try to examine the cervical adenopathy patient jerked her head away indicating that it was very tender and she was in a lot of pain at this point that part of examination was stopped  Cardiovascular: Normal rate and regular rhythm.   Pulmonary/Chest: Effort normal.  Musculoskeletal: Normal range of motion.  Lymphadenopathy:    She has cervical adenopathy.  Neurological: She is alert.  Skin: Skin is warm.  Psychiatric: She has a normal mood and affect.  Vitals reviewed.    UC Treatments / Results  Labs (all labs ordered are listed, but only abnormal results are displayed) Labs Reviewed  RAPID STREP SCREEN (NOT AT [facility])  CULTURE, GROUP A STREP [facility])    EKG  EKG Interpretation None       Radiology No results found.  Procedures Procedures (including critical care time) Results for orders placed or performed  during the hospital encounter of 02/27/17  Rapid strep screen  Result Value Ref Range   Streptococcus, Group A Screen (Direct) NEGATIVE NEGATIVE   Medications Ordered in UC Medications - No data to display   Initial Impression / Assessment and Plan / UC Course  I have reviewed the triage vital signs and the nursing notes.  Pertinent labs & imaging results that were available during my care of the patient were reviewed by me and considered in  my medical decision making (see chart for details).       Final Clinical Impressions(s) / UC Diagnoses   Final diagnoses:  Eustachian tube dysfunction, right  Acute pharyngitis, unspecified etiology    New Prescriptions New Prescriptions   AMOXICILLIN (AMOXIL) 875 MG TABLET    Take 1 tablet (875 mg total) by mouth 2 (two) times daily.   FLUTICASONE (FLONASE) 50 MCG/ACT NASAL SPRAY    Place 2 sprays into both nostrils daily.      Note: This dictation was prepared with Dragon dictation along with smaller phrase technology. Any transcriptional errors that result from this process are unintentional.   Frederich Cha, MD 02/27/17 [phone]

## 2017-02-27 NOTE — ED Triage Notes (Signed)
Pt is currently taking Tamiflu to treat the flu. She started to feel better however she started running a fever yesterday afternoon and this morning her throat is hurting and right ear pain.

## 2017-03-02 LAB — CULTURE, GROUP A STREP (THRC)

## 2017-03-07 ENCOUNTER — Ambulatory Visit (INDEPENDENT_AMBULATORY_CARE_PROVIDER_SITE_OTHER): Payer: BC Managed Care – PPO | Admitting: Orthopaedic Surgery

## 2017-03-10 ENCOUNTER — Telehealth (INDEPENDENT_AMBULATORY_CARE_PROVIDER_SITE_OTHER): Payer: Self-pay | Admitting: *Deleted

## 2017-03-10 ENCOUNTER — Ambulatory Visit (INDEPENDENT_AMBULATORY_CARE_PROVIDER_SITE_OTHER): Payer: Worker's Compensation | Admitting: Orthopaedic Surgery

## 2017-03-10 ENCOUNTER — Encounter (INDEPENDENT_AMBULATORY_CARE_PROVIDER_SITE_OTHER): Payer: Self-pay | Admitting: Orthopaedic Surgery

## 2017-03-10 VITALS — BP 111/80 | HR 83 | Ht 60.0 in | Wt 135.0 lb

## 2017-03-10 DIAGNOSIS — Z9889 Other specified postprocedural states: Secondary | ICD-10-CM | POA: Diagnosis not present

## 2017-03-10 NOTE — Progress Notes (Signed)
   Office Visit Note   Patient: Adelita Hone           Date of Birth: July 14, 1966           MRN: [id number] Visit Date: 03/10/2017              Requested by: [name], MD [address] [place], [place] [address] PCP: [name], MD   Assessment & Plan: Visit Diagnoses: Status post anterior cruciate ligament reconstruction left knee June 2017 and doing very well   Plan:  Has reached maximal medical improvement, continue with exercise program at home and we'll see back on a when necessary basis.  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  No orders of the defined types were placed in this encounter.     Procedures: No procedures performed   Clinical Data: No additional findings.   Subjective: No chief complaint on file.   [facility] COMP:  Ms. Offner is here today for a follow up visit for her Left ACL, June 03, 2016.  She relates the left leg is weaker, but she has been exercising with the bike and strengthening exercises 3 x week. She does here "popping and clicking".    Relates that she still feels a little bit weak in the left leg compared to the right and is still exercising. She occasionally gets some popping and clicking but does not have any sensation of her knee swelling or giving way as it did prior to surgery is to work full time in her job teaching shoulder with disabilities  Review of Systems   Objective: Vital Signs: There were no vitals taken for this visit.  Physical Exam  Ortho Exam left knee without effusion. All of her incisions have healed without any complications. Negative anterior drawer sign and Lachman's test. No calf pain. Neurovascular exam intact. Full quick extension and flexion well beyond 105 without varus or valgus instability. Left calf is approximately 2 cm smaller in girth than right  Specialty Comments:  No  specialty comments available.  Imaging: No results found.   PMFS History: Patient Active Problem List   Diagnosis Date Noted  . Influenza 02/25/2017  . Abnormal mammogram 07/07/2016  . Overweight 02/11/2016  . Health care maintenance 04/05/2015  . Environmental allergies 01/17/2015  . Asthma 01/17/2015  . Menopausal symptoms 01/17/2015  . Endometriosis 01/17/2015  . Hypothyroidism 01/14/2015   Past Medical History:  Diagnosis Date  . Allergy    cats trigger  . Asthma    mild  . Thyroid disease     Family History  Problem Relation Age of Onset  . Arthritis Mother     Past Surgical History:  Procedure Laterality Date  . ABDOMINAL HYSTERECTOMY     cervix left in place  . APPENDECTOMY  1991  . CHOLECYSTECTOMY     Social History   Occupational History  . Not on file.   Social History Main Topics  . Smoking status: Never Smoker  . Smokeless tobacco: Never Used  . Alcohol use 0.0 oz/week     Comment: occ.  . Drug use: No  . Sexual activity: Not on file

## 2017-03-10 NOTE — Telephone Encounter (Signed)
Please see Dr. Rudene Anda note regarding worker's comp.

## 2017-04-19 ENCOUNTER — Other Ambulatory Visit: Payer: Self-pay | Admitting: Internal Medicine

## 2017-05-10 ENCOUNTER — Telehealth: Payer: Self-pay | Admitting: Internal Medicine

## 2017-05-10 NOTE — Telephone Encounter (Signed)
Pt called about needing a refill for levothyroxine (SYNTHROID, LEVOTHROID) 88 MCG tablet.  Pharmacy is [facility] - [place], [address] AT [place]  Call pt @ [phone]. Thank you!

## 2017-05-11 ENCOUNTER — Ambulatory Visit (INDEPENDENT_AMBULATORY_CARE_PROVIDER_SITE_OTHER): Payer: BC Managed Care – PPO | Admitting: Family Medicine

## 2017-05-11 ENCOUNTER — Encounter: Payer: Self-pay | Admitting: Family Medicine

## 2017-05-11 DIAGNOSIS — J0100 Acute maxillary sinusitis, unspecified: Secondary | ICD-10-CM

## 2017-05-11 DIAGNOSIS — J019 Acute sinusitis, unspecified: Secondary | ICD-10-CM | POA: Insufficient documentation

## 2017-05-11 MED ORDER — AMOXICILLIN-POT CLAVULANATE 875-125 MG PO TABS: 1.0000 | ORAL_TABLET | Freq: Two times a day (BID) | ORAL | 0 refills | Status: DC

## 2017-05-11 NOTE — Telephone Encounter (Signed)
Schedule non fasting lab appt for tsh.  Once scheduled ok to refill until lab appt.  Keep f/u appt.

## 2017-05-11 NOTE — Patient Instructions (Signed)
Take the medication as prescribed.  Feel better.  Take care  Dr. Lacinda Axon

## 2017-05-11 NOTE — Progress Notes (Signed)
   Subjective:  Patient ID: Angela Pacheco, female    DOB: 09/23/1966  Age: 51 y.o. MRN: [id number]  CC: Concern for sinusitis  HPI:  51 year old female with allergies presents with the above complaint.  Patient states that last night she developed sudden onset severe right facial pain, ear pain, and sore throat. No associated fever. No chills. Her sinus pressure and pain is quite severe. No reports of purulent nasal discharge. She used to [employer] pot with worsening of her symptoms. No known relieving factors. She has continued to take her regular allergy medications without relief. No other complaints or concerns at this time.  Social Hx   Social History   Social History  . Marital status: Married    Spouse name: N/A  . Number of children: N/A  . Years of education: N/A   Social History Main Topics  . Smoking status: Never Smoker  . Smokeless tobacco: Never Used  . Alcohol use 0.0 oz/week     Comment: occ.  . Drug use: No  . Sexual activity: Not Asked   Other Topics Concern  . None   Social History Narrative  . None    Review of Systems  Constitutional: Negative.   HENT: Positive for ear pain, sinus pain, sinus pressure and sore throat.    Objective:  BP 102/64   Pulse 73   Temp 98.5 F (36.9 C) (Oral)   Wt 137 lb 6 oz (62.3 kg)   SpO2 98%   BMI 26.83 kg/m   BP/Weight 05/11/2017 0/35/4656 07/27/2750  Systolic BP 700 174 94  Diastolic BP 64 80 70  Wt. (Lbs) 137.38 135 135  BMI 26.83 26.37 25.51   Physical Exam  Constitutional: She is oriented to person, place, and time. She appears well-developed. No distress.  HENT:  Head: Normocephalic and atraumatic.  Normal TMs bilaterally. Severe right maxillary sinus tenderness to palpation.  Cardiovascular: Normal rate and regular rhythm.   Pulmonary/Chest: Effort normal and breath sounds normal.  Neurological: She is alert and oriented to person, place, and time.  Psychiatric: She has a normal mood and affect.   Vitals reviewed.   Lab Results  Component Value Date   WBC 8.6 04/02/2016   HGB 14.9 04/02/2016   HCT 43.5 04/02/2016   PLT 349.0 04/02/2016   GLUCOSE 87 04/02/2016   CHOL 189 04/02/2016   TRIG 93.0 04/02/2016   HDL 53.30 04/02/2016   LDLCALC 117 (H) 04/02/2016   ALT 24 04/02/2016   AST 20 04/02/2016   NA 139 04/02/2016   K 4.0 04/02/2016   CL 105 04/02/2016   CREATININE 0.81 04/02/2016   BUN 14 04/02/2016   CO2 27 04/02/2016   TSH 2.69 02/09/2016    Assessment & Plan:   Problem List Items Addressed This Visit    Acute sinusitis    New acute problem. Treating with augmentin.      Relevant Medications   amoxicillin-clavulanate (AUGMENTIN) 875-125 MG tablet      Meds ordered this encounter  Medications  . amoxicillin-clavulanate (AUGMENTIN) 875-125 MG tablet    Sig: Take 1 tablet by mouth 2 (two) times daily.    Dispense:  14 tablet    Refill:  0    Follow-up: PRN  [place]

## 2017-05-11 NOTE — Telephone Encounter (Signed)
Patient last lab was 02-09-16  Next app is on 08-26-17.  Ok to refill until f/u?

## 2017-05-11 NOTE — Assessment & Plan Note (Signed)
New acute problem. Treating with augmentin.

## 2017-05-12 ENCOUNTER — Other Ambulatory Visit: Payer: Self-pay | Admitting: Internal Medicine

## 2017-05-12 NOTE — Telephone Encounter (Signed)
Left message to return call to our office.  

## 2017-05-16 NOTE — Telephone Encounter (Signed)
Called pt and left vm to call the office.

## 2017-05-16 NOTE — Telephone Encounter (Signed)
Lab app made.

## 2017-05-17 ENCOUNTER — Ambulatory Visit
Admission: RE | Admit: 2017-05-17 | Discharge: 2017-05-17 | Disposition: A | Discharge status: Home / Self Care | Payer: BC Managed Care – PPO | Source: Ambulatory Visit | Attending: Internal Medicine | Admitting: Internal Medicine

## 2017-05-17 ENCOUNTER — Other Ambulatory Visit (INDEPENDENT_AMBULATORY_CARE_PROVIDER_SITE_OTHER): Payer: BC Managed Care – PPO

## 2017-05-17 ENCOUNTER — Telehealth: Payer: Self-pay | Admitting: Radiology

## 2017-05-17 ENCOUNTER — Other Ambulatory Visit: Payer: Self-pay | Admitting: Internal Medicine

## 2017-05-17 DIAGNOSIS — E039 Hypothyroidism, unspecified: Secondary | ICD-10-CM

## 2017-05-17 DIAGNOSIS — R928 Other abnormal and inconclusive findings on diagnostic imaging of breast: Secondary | ICD-10-CM | POA: Insufficient documentation

## 2017-05-17 NOTE — Telephone Encounter (Signed)
Where did she have labs drawn.  I do not see where tsh was drawn.  Thanks

## 2017-05-17 NOTE — Telephone Encounter (Signed)
Pt came in for labs today and stated she was here to have her TSH drawn. PT states she called about getting a refill for her thyroid medication and was told she needed labs. Please place future orders. Thank you.

## 2017-05-17 NOTE — Progress Notes (Signed)
Order placed for tsh 

## 2017-05-17 NOTE — Telephone Encounter (Signed)
Had labs done today do you want me to hold of on calling in refill until we get results?

## 2017-05-17 NOTE — Telephone Encounter (Signed)
Order placed for f/u tsh.  Thanks.   

## 2017-05-18 LAB — TSH: TSH: 4.39 u[IU]/mL (ref 0.35–4.50)

## 2017-05-18 NOTE — Telephone Encounter (Signed)
See mammogram report note

## 2017-05-19 ENCOUNTER — Encounter: Payer: Self-pay | Admitting: Internal Medicine

## 2017-05-19 MED ORDER — LEVOTHYROXINE SODIUM 88 MCG PO TABS: ORAL_TABLET | ORAL | 3 refills | Status: DC

## 2017-05-19 NOTE — Telephone Encounter (Signed)
rx sent in for thyroid medication #30 with 3 refills.

## 2017-05-21 ENCOUNTER — Other Ambulatory Visit: Payer: Self-pay | Admitting: Internal Medicine

## 2017-05-25 ENCOUNTER — Other Ambulatory Visit: Payer: Self-pay

## 2017-05-25 NOTE — Telephone Encounter (Signed)
Hard copy mailed  

## 2017-07-29 ENCOUNTER — Encounter: Payer: BC Managed Care – PPO | Admitting: Internal Medicine

## 2017-08-11 ENCOUNTER — Ambulatory Visit (INDEPENDENT_AMBULATORY_CARE_PROVIDER_SITE_OTHER): Payer: BC Managed Care – PPO | Admitting: Family

## 2017-08-11 ENCOUNTER — Encounter: Payer: Self-pay | Admitting: Family

## 2017-08-11 VITALS — BP 112/70 | HR 85 | Temp 98.3°F | Ht 60.0 in | Wt 141.0 lb

## 2017-08-11 DIAGNOSIS — J301 Allergic rhinitis due to pollen: Secondary | ICD-10-CM | POA: Diagnosis not present

## 2017-08-11 MED ORDER — PREDNISONE 10 MG PO TABS: ORAL_TABLET | ORAL | 0 refills | Status: DC

## 2017-08-11 NOTE — Patient Instructions (Signed)
Start prednisone  Plenty of water  Let me know if doesn't help

## 2017-08-11 NOTE — Progress Notes (Signed)
Pre visit review using our clinic review tool, if applicable. No additional management support is needed unless otherwise documented below in the visit note. 

## 2017-08-11 NOTE — Progress Notes (Signed)
Subjective:    Patient ID: Angela Pacheco, female    DOB: [birth year], 51 y.o.   MRN: [id number]  CC: Angela Pacheco is a 51 y.o. female who presents today for an acute visit.    HPI: CC: sinus congestion, right sided facial pressure x 4 days, unchanged.  Suspects dust, pollen may have triggered. 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   Tried zyrtec saline wash, decongestant.   NO recent antibiotics     HISTORY:  Past Medical History:  Diagnosis Date  . Allergy    cats trigger  . Asthma    mild  . Thyroid disease    Past Surgical History:  Procedure Laterality Date  . ABDOMINAL HYSTERECTOMY     cervix left in place  . APPENDECTOMY  1991  . CHOLECYSTECTOMY     Family History  Problem Relation Age of Onset  . Arthritis Mother     Allergies: Patient has no known allergies. Current Outpatient Prescriptions on File Prior to Visit  Medication Sig Dispense Refill  . DYMISTA 137-50 MCG/ACT SUSP     . fluticasone (FLONASE) 50 MCG/ACT nasal spray Place 2 sprays into both nostrils daily. 16 g 0  . levocetirizine (XYZAL) 5 MG tablet     . levothyroxine (SYNTHROID, LEVOTHROID) 88 MCG tablet TAKE 1 TABLET(88 MCG) BY MOUTH DAILY 30 tablet 3  . levothyroxine (SYNTHROID, LEVOTHROID) 88 MCG tablet TAKE 1 TABLET BY MOUTH DAILY 30 tablet 0  . loratadine (CLARITIN) 10 MG tablet Take 10 mg by mouth daily.     No current facility-administered medications on file prior to visit.     Social History  Substance Use Topics  . Smoking status: Never Smoker  . Smokeless tobacco: Never Used  . Alcohol use 0.0 oz/week     Comment: occ.    Review of Systems  Constitutional: Negative for chills and fever.  HENT: Positive for congestion, sinus pressure and voice change. Negative for sore throat.   Respiratory: Negative for cough and  shortness of breath.   Cardiovascular: Negative for chest pain and palpitations.  Gastrointestinal: Negative for nausea and vomiting.  Neurological: Positive for headaches.      Objective:    BP 112/70   Pulse 85   Temp 98.3 F (36.8 C) (Oral)   Ht 5' (1.524 m)   Wt 141 lb (64 kg)   SpO2 93%   BMI 27.54 kg/m    Physical Exam  Constitutional: She appears well-developed and well-nourished.  HENT:  Head: Normocephalic and atraumatic.  Right Ear: Hearing, tympanic membrane, external ear and ear canal normal. No drainage, swelling or tenderness. No foreign bodies. Tympanic membrane is not erythematous and not bulging. No middle ear effusion. No decreased hearing is noted.  Left Ear: Hearing, tympanic membrane, external ear and ear canal normal. No drainage, swelling or tenderness. No foreign bodies. Tympanic membrane is not erythematous and not bulging.  No middle ear effusion. No decreased hearing is noted.  Nose: Nose normal. No rhinorrhea. Right sinus exhibits no maxillary sinus tenderness and no frontal sinus tenderness. Left sinus exhibits no maxillary sinus tenderness and no frontal sinus tenderness.  Mouth/Throat: Uvula is midline, oropharynx is clear and moist and mucous membranes are normal. No oropharyngeal exudate, posterior oropharyngeal edema, posterior oropharyngeal erythema or tonsillar abscesses.  Eyes: Conjunctivae are normal.  Cardiovascular: Regular rhythm, normal heart sounds and normal pulses.   Pulmonary/Chest: Effort normal and breath sounds normal. She has no wheezes. She has no rhonchi. She has no rales.  Lymphadenopathy:       Head (right side): No submental, no submandibular, no tonsillar, no preauricular, no posterior auricular and no occipital adenopathy present.       Head (left side): No submental, no submandibular, no tonsillar, no preauricular, no posterior auricular and no occipital adenopathy present.    She has no cervical adenopathy.  Neurological:  She is alert.  Skin: Skin is warm and dry.  Psychiatric: She has a normal mood and affect. Her speech is normal and behavior is normal. Thought content normal.  Vitals reviewed.      Assessment & Plan:  1. Non-seasonal allergic rhinitis due to pollen Afebrile. Patient is well-appearing. Based on duration of symptoms, patient and I jointly agreed to treat for viral or allergic etiology with short prednisone taper. If no improvement patient will let me know.  - predniSONE (DELTASONE) 10 MG tablet; Take 40 mg by mouth on day 1, then taper 10 mg daily until gone  Dispense: 10 tablet; Refill: 0     I have discontinued Ms. Harsha's amoxicillin-clavulanate. I am also having her start on predniSONE. Additionally, I am having her maintain her DYMISTA, levocetirizine, loratadine, fluticasone, levothyroxine, and levothyroxine.   Meds ordered this encounter  Medications  . predniSONE (DELTASONE) 10 MG tablet    Sig: Take 40 mg by mouth on day 1, then taper 10 mg daily until gone    Dispense:  10 tablet    Refill:  0    Order Specific Question:   Supervising Provider    Answer:   Crecencio Mc [[id number]]    Return precautions given.   Risks, benefits, and alternatives of the medications and treatment plan prescribed today were discussed, and patient expressed understanding.   Education regarding symptom management and diagnosis given to patient on AVS.  Continue to follow with [name], MD for routine health maintenance.   Angela Pacheco and I agreed with plan.   Mable Paris, FNP

## 2017-09-19 ENCOUNTER — Other Ambulatory Visit: Payer: Self-pay | Admitting: Internal Medicine

## 2017-09-20 MED ORDER — LEVOTHYROXINE SODIUM 88 MCG PO TABS: 88.0000 ug | ORAL_TABLET | Freq: Every day | ORAL | 0 refills | Status: DC

## 2017-09-26 ENCOUNTER — Encounter: Payer: Self-pay | Admitting: Internal Medicine

## 2017-09-26 ENCOUNTER — Ambulatory Visit (INDEPENDENT_AMBULATORY_CARE_PROVIDER_SITE_OTHER): Payer: BC Managed Care – PPO | Admitting: Internal Medicine

## 2017-09-26 VITALS — BP 110/68 | HR 62 | Temp 98.6°F | Resp 12 | Ht 60.0 in | Wt 140.8 lb

## 2017-09-26 DIAGNOSIS — Z9109 Other allergy status, other than to drugs and biological substances: Secondary | ICD-10-CM | POA: Diagnosis not present

## 2017-09-26 DIAGNOSIS — Z0000 Encounter for general adult medical examination without abnormal findings: Secondary | ICD-10-CM | POA: Diagnosis not present

## 2017-09-26 DIAGNOSIS — Z1211 Encounter for screening for malignant neoplasm of colon: Secondary | ICD-10-CM

## 2017-09-26 DIAGNOSIS — J4520 Mild intermittent asthma, uncomplicated: Secondary | ICD-10-CM | POA: Diagnosis not present

## 2017-09-26 DIAGNOSIS — E039 Hypothyroidism, unspecified: Secondary | ICD-10-CM | POA: Diagnosis not present

## 2017-09-26 DIAGNOSIS — L989 Disorder of the skin and subcutaneous tissue, unspecified: Secondary | ICD-10-CM | POA: Diagnosis not present

## 2017-09-26 MED ORDER — LEVOTHYROXINE SODIUM 88 MCG PO TABS: ORAL_TABLET | ORAL | 3 refills | Status: DC

## 2017-09-26 NOTE — Progress Notes (Signed)
Patient ID: Angela Pacheco, female   DOB: 06-22-1966, 51 y.o.   MRN: [id number]   Subjective:    Patient ID: Angela Pacheco, female    DOB: 1966/02/23, 51 y.o.   MRN: [id number]  HPI  Patient here for her physical exam.  She reports she is doing relatively well.  Tries to stay active.  Exercising four days per week. No chest pain.  No sob.  No acid reflux.  She has noticed if she avoids wheat and gluten - does well.  Takes claritin/zyrtec.  No abdominal pain.  Bowels moving.  Concerned regarding persistent right leg lesion.  Request referral to dermatology to have this evaluated and for skin survey.     Past Medical History:  Diagnosis Date  . Allergy    cats trigger  . Asthma    mild  . Thyroid disease    Past Surgical History:  Procedure Laterality Date  . ABDOMINAL HYSTERECTOMY     cervix left in place  . APPENDECTOMY  1991  . CHOLECYSTECTOMY     Family History  Problem Relation Age of Onset  . Arthritis Mother    Social History   Social History  . Marital status: Married    Spouse name: N/A  . Number of children: N/A  . Years of education: N/A   Social History Main Topics  . Smoking status: Never Smoker  . Smokeless tobacco: Never Used  . Alcohol use 0.0 oz/week     Comment: occ.  . Drug use: No  . Sexual activity: Not Asked   Other Topics Concern  . None   Social History Narrative  . None    Outpatient Encounter Prescriptions as of 09/26/2017  Medication Sig  . fluticasone (FLONASE) 50 MCG/ACT nasal spray Place 2 sprays into both nostrils daily.  Marland Kitchen levothyroxine (SYNTHROID, LEVOTHROID) 88 MCG tablet TAKE 1 TABLET(88 MCG) BY MOUTH DAILY  . loratadine (CLARITIN) 10 MG tablet Take 10 mg by mouth daily.  . Zolpidem Tartrate (AMBIEN PO) Take by mouth.  . [DISCONTINUED] DYMISTA 137-50 MCG/ACT SUSP   . [DISCONTINUED] levocetirizine (XYZAL) 5 MG tablet   . [DISCONTINUED] levothyroxine (SYNTHROID, LEVOTHROID) 88 MCG tablet TAKE 1 TABLET(88 MCG) BY  MOUTH DAILY  . [DISCONTINUED] levothyroxine (SYNTHROID, LEVOTHROID) 88 MCG tablet Take 1 tablet (88 mcg total) by mouth daily.  . [DISCONTINUED] predniSONE (DELTASONE) 10 MG tablet Take 40 mg by mouth on day 1, then taper 10 mg daily until gone   No facility-administered encounter medications on file as of 09/26/2017.     Review of Systems  Constitutional: Negative for appetite change and unexpected weight change.  HENT: Negative for congestion and sinus pressure.   Eyes: Negative for pain and visual disturbance.  Respiratory: Negative for cough, chest tightness and shortness of breath.   Cardiovascular: Negative for chest pain, palpitations and leg swelling.  Gastrointestinal: Negative for abdominal pain, diarrhea, nausea and vomiting.  Genitourinary: Negative for difficulty urinating and dysuria.  Musculoskeletal: Negative for back pain and joint swelling.  Skin: Negative for color change and rash.  Neurological: Negative for dizziness, light-headedness and headaches.  Hematological: Negative for adenopathy. Does not bruise/bleed easily.  Psychiatric/Behavioral: Negative for agitation and dysphoric mood.       Objective:    Physical Exam  Constitutional: She is oriented to person, place, and time. She appears well-developed and well-nourished. No distress.  HENT:  Nose: Nose normal.  Mouth/Throat: Oropharynx is clear and moist.  Eyes: Right eye exhibits no discharge. Left  eye exhibits no discharge. No scleral icterus.  Neck: Neck supple. No thyromegaly present.  Cardiovascular: Normal rate and regular rhythm.   Pulmonary/Chest: Breath sounds normal. No accessory muscle usage. No tachypnea. No respiratory distress. She has no decreased breath sounds. She has no wheezes. She has no rhonchi. Right breast exhibits no inverted nipple, no mass, no nipple discharge and no tenderness (no axillary adenopathy). Left breast exhibits no inverted nipple, no mass, no nipple discharge and no  tenderness (no axilarry adenopathy).  Abdominal: Soft. Bowel sounds are normal. There is no tenderness.  Musculoskeletal: She exhibits no edema or tenderness.  Lymphadenopathy:    She has no cervical adenopathy.  Neurological: She is alert and oriented to person, place, and time.  Skin: Skin is warm. No rash noted. No erythema.  Psychiatric: She has a normal mood and affect. Her behavior is normal.    BP 110/68 (BP Location: Left Arm, Patient Position: Sitting, Cuff Size: Normal)   Pulse 62   Temp 98.6 F (37 C) (Oral)   Resp 12   Ht 5' (1.524 m)   Wt 140 lb 12.8 oz (63.9 kg)   SpO2 98%   BMI 27.50 kg/m  Wt Readings from Last 3 Encounters:  09/26/17 140 lb 12.8 oz (63.9 kg)  08/11/17 141 lb (64 kg)  05/11/17 137 lb 6 oz (62.3 kg)     Lab Results  Component Value Date   WBC 8.6 04/02/2016   HGB 14.9 04/02/2016   HCT 43.5 04/02/2016   PLT 349.0 04/02/2016   GLUCOSE 87 04/02/2016   CHOL 189 04/02/2016   TRIG 93.0 04/02/2016   HDL 53.30 04/02/2016   LDLCALC 117 (H) 04/02/2016   ALT 24 04/02/2016   AST 20 04/02/2016   NA 139 04/02/2016   K 4.0 04/02/2016   CL 105 04/02/2016   CREATININE 0.81 04/02/2016   BUN 14 04/02/2016   CO2 27 04/02/2016   TSH 4.39 05/17/2017    US Breast Ltd Uni Left Inc Axilla  Result Date: 05/17/2017 CLINICAL DATA:  Short-term follow-up left breast masses EXAM: 2D DIGITAL DIAGNOSTIC BILATERAL MAMMOGRAM WITH CAD AND ADJUNCT TOMO ULTRASOUND LEFT BREAST COMPARISON:  Previous exam(s). ACR Breast Density Category b: There are scattered areas of fibroglandular density. FINDINGS: CC and MLO views of bilateral breasts are submitted. Stable mass is identified in the left breast unchanged. The right breasts stable. Mammographic images were processed with CAD. Targeted ultrasound is performed, showing stable oval hypoechoic lesion at the left breast 3 o'clock 10 cm from nipple measuring 0.7 x 0.4 x 0.6 cm. There is a 1.6 x 0.4 x 0.8 cm oval hypoechoic lesion  at left breast 3 o'clock 12 cm from nipple stable compared prior exam. IMPRESSION: Probable benign findings. RECOMMENDATION: Twelve month followup ultrasound left breast. I have discussed the findings and recommendations with the patient. Results were also provided in writing at the conclusion of the visit. If applicable, a reminder letter will be sent to the patient regarding the next appointment. BI-RADS CATEGORY  3: Probably benign. Electronically Signed   By: Abelardo Diesel M.D.   On: 05/17/2017 11:38   Mm Diag Breast Tomo Bilateral  Result Date: 05/17/2017 CLINICAL DATA:  Short-term follow-up left breast masses EXAM: 2D DIGITAL DIAGNOSTIC BILATERAL MAMMOGRAM WITH CAD AND ADJUNCT TOMO ULTRASOUND LEFT BREAST COMPARISON:  Previous exam(s). ACR Breast Density Category b: There are scattered areas of fibroglandular density. FINDINGS: CC and MLO views of bilateral breasts are submitted. Stable mass is identified in the  left breast unchanged. The right breasts stable. Mammographic images were processed with CAD. Targeted ultrasound is performed, showing stable oval hypoechoic lesion at the left breast 3 o'clock 10 cm from nipple measuring 0.7 x 0.4 x 0.6 cm. There is a 1.6 x 0.4 x 0.8 cm oval hypoechoic lesion at left breast 3 o'clock 12 cm from nipple stable compared prior exam. IMPRESSION: Probable benign findings. RECOMMENDATION: Twelve month followup ultrasound left breast. I have discussed the findings and recommendations with the patient. Results were also provided in writing at the conclusion of the visit. If applicable, a reminder letter will be sent to the patient regarding the next appointment. BI-RADS CATEGORY  3: Probably benign. Electronically Signed   By: Abelardo Diesel M.D.   On: 05/17/2017 11:38       Assessment & Plan:   Problem List Items Addressed This Visit    Asthma    Breathing stable.       Environmental allergies    Does well if avoids wheat and gluten.  Follow.        Health  care maintenance    Physical today 09/26/17.  PAP 03/31/15 - negative with negative HPV.  Mammogram and ultrasound 05/17/17 - [facility].  Recommended f/u in 12 months.  Will be 52 soon.  Maternal uncle with colon cancer.  Refer to GI for evaluation for colonoscopy.        Hypothyroidism    On thyroid replacement.  Follow tsh.        Relevant Medications   levothyroxine (SYNTHROID, LEVOTHROID) 88 MCG tablet   Other Relevant Orders   TSH   TSH    Other Visit Diagnoses    Routine general medical examination at a health care facility    -  Primary   Skin lesion of right leg       Persistent.  request referral to dermatology.  also wants skin survey.     Relevant Orders   Ambulatory referral to Dermatology   Colon cancer screening       Relevant Orders   Ambulatory referral to Gastroenterology       [name], MD

## 2017-09-26 NOTE — Assessment & Plan Note (Addendum)
Physical today 09/26/17.  PAP 03/31/15 - negative with negative HPV.  Mammogram and ultrasound 05/17/17 - [facility].  Recommended f/u in 12 months.  Will be 52 soon.  Maternal uncle with colon cancer.  Refer to GI for evaluation for colonoscopy.

## 2017-09-29 ENCOUNTER — Encounter: Payer: Self-pay | Admitting: Internal Medicine

## 2017-09-29 NOTE — Assessment & Plan Note (Signed)
Breathing stable.

## 2017-09-29 NOTE — Assessment & Plan Note (Signed)
On thyroid replacement.  Follow tsh.  

## 2017-09-29 NOTE — Assessment & Plan Note (Signed)
Does well if avoids wheat and gluten.  Follow.

## 2017-11-01 DIAGNOSIS — D239 Other benign neoplasm of skin, unspecified: Secondary | ICD-10-CM

## 2017-11-01 HISTORY — DX: Other benign neoplasm of skin, unspecified: D23.9

## 2018-02-02 ENCOUNTER — Other Ambulatory Visit: Payer: BC Managed Care – PPO

## 2018-02-06 ENCOUNTER — Other Ambulatory Visit (INDEPENDENT_AMBULATORY_CARE_PROVIDER_SITE_OTHER): Payer: BC Managed Care – PPO

## 2018-02-06 ENCOUNTER — Encounter: Payer: Self-pay | Admitting: Internal Medicine

## 2018-02-06 DIAGNOSIS — E039 Hypothyroidism, unspecified: Secondary | ICD-10-CM | POA: Diagnosis not present

## 2018-02-06 LAB — TSH: TSH: 2.86 u[IU]/mL (ref 0.35–4.50)

## 2018-02-10 NOTE — Telephone Encounter (Signed)
Unread mychart message mailed to patient 

## 2018-03-09 ENCOUNTER — Encounter: Payer: Self-pay | Admitting: Internal Medicine

## 2018-03-09 ENCOUNTER — Ambulatory Visit: Payer: BC Managed Care – PPO | Admitting: Internal Medicine

## 2018-03-09 DIAGNOSIS — J0100 Acute maxillary sinusitis, unspecified: Secondary | ICD-10-CM | POA: Diagnosis not present

## 2018-03-09 MED ORDER — AMOXICILLIN 875 MG PO TABS: 875.0000 mg | ORAL_TABLET | Freq: Two times a day (BID) | ORAL | 0 refills | Status: DC

## 2018-03-09 NOTE — Progress Notes (Signed)
Patient ID: Angela Pacheco, female   DOB: 09-05-1966, 52 y.o.   MRN: [id number]   Subjective:    Patient ID: Angela Pacheco, female    DOB: [birth year], 52 y.o.   MRN: [id number]  HPI  Patient here as a work in with concerns regarding possible sinus infection.  Symptoms started last week.  Describes a tickle in her throat.  Subjective fever.  Ear pain.  Increased chest congestion - from increased drainage.  Increased sinus pressure.  No vomiting.  No diarrhea.  Eating.     Past Medical History:  Diagnosis Date  . Allergy    cats trigger  . Asthma    mild  . Thyroid disease    Past Surgical History:  Procedure Laterality Date  . ABDOMINAL HYSTERECTOMY     cervix left in place  . APPENDECTOMY  1991  . CHOLECYSTECTOMY     Family History  Problem Relation Age of Onset  . Arthritis Mother    Social History   Socioeconomic History  . Marital status: Married    Spouse name: None  . Number of children: None  . Years of education: None  . Highest education level: None  Social Needs  . Financial resource strain: None  . Food insecurity - worry: None  . Food insecurity - inability: None  . Transportation needs - medical: None  . Transportation needs - non-medical: None  Occupational History  . None  Tobacco Use  . Smoking status: Never Smoker  . Smokeless tobacco: Never Used  Substance and Sexual Activity  . Alcohol use: Yes    Alcohol/week: 0.0 oz    Comment: occ.  . Drug use: No  . Sexual activity: None  Other Topics Concern  . None  Social History Narrative  . None    Outpatient Encounter Medications as of 03/09/2018  Medication Sig  . amoxicillin (AMOXIL) 875 MG tablet Take 1 tablet (875 mg total) by mouth 2 (two) times daily.  . fluticasone (FLONASE) 50 MCG/ACT nasal spray Place 2 sprays into both nostrils daily.  Marland Kitchen levothyroxine (SYNTHROID, LEVOTHROID) 88 MCG tablet TAKE 1 TABLET(88 MCG) BY MOUTH DAILY  . loratadine (CLARITIN) 10 MG tablet Take 10  mg by mouth daily.  . Zolpidem Tartrate (AMBIEN PO) Take by mouth.   No facility-administered encounter medications on file as of 03/09/2018.     Review of Systems  Constitutional: Negative for appetite change and unexpected weight change.  HENT: Positive for congestion, postnasal drip and sinus pressure.   Respiratory: Positive for cough. Negative for chest tightness and shortness of breath.   Cardiovascular: Negative for chest pain.  Gastrointestinal: Negative for abdominal pain, diarrhea, nausea and vomiting.  Skin: Negative for color change and rash.  Neurological: Negative for dizziness, light-headedness and headaches.       Objective:    Physical Exam  Constitutional: She appears well-developed and well-nourished. No distress.  HENT:  Mouth/Throat: Oropharynx is clear and moist. No oropharyngeal exudate.  Nares - slightly erythematous turbinates.  Minimal tenderness to palpation over the sinuses.    Eyes: Conjunctivae are normal. Right eye exhibits no discharge. Left eye exhibits no discharge.  Neck: Neck supple.  Cardiovascular: Normal rate and regular rhythm.  Pulmonary/Chest: Breath sounds normal. No respiratory distress. She has no wheezes.  Lymphadenopathy:    She has no cervical adenopathy.  Skin: No rash noted. No erythema.  Psychiatric: She has a normal mood and affect. Her behavior is normal.    BP 110/80 (BP  Location: Left Arm, Cuff Size: Normal)   Pulse 89   Temp 99.7 F (37.6 C) (Oral)   Resp 18   Wt 137 lb (62.1 kg)   SpO2 96%   BMI 26.76 kg/m  Wt Readings from Last 3 Encounters:  03/09/18 137 lb (62.1 kg)  09/26/17 140 lb 12.8 oz (63.9 kg)  08/11/17 141 lb (64 kg)     Lab Results  Component Value Date   WBC 8.6 04/02/2016   HGB 14.9 04/02/2016   HCT 43.5 04/02/2016   PLT 349.0 04/02/2016   GLUCOSE 87 04/02/2016   CHOL 189 04/02/2016   TRIG 93.0 04/02/2016   HDL 53.30 04/02/2016   LDLCALC 117 (H) 04/02/2016   ALT 24 04/02/2016   AST 20  04/02/2016   NA 139 04/02/2016   K 4.0 04/02/2016   CL 105 04/02/2016   CREATININE 0.81 04/02/2016   BUN 14 04/02/2016   CO2 27 04/02/2016   TSH 2.86 02/06/2018    US Breast Ltd Uni Left Inc Axilla  Result Date: 05/17/2017 CLINICAL DATA:  Short-term follow-up left breast masses EXAM: 2D DIGITAL DIAGNOSTIC BILATERAL MAMMOGRAM WITH CAD AND ADJUNCT TOMO ULTRASOUND LEFT BREAST COMPARISON:  Previous exam(s). ACR Breast Density Category b: There are scattered areas of fibroglandular density. FINDINGS: CC and MLO views of bilateral breasts are submitted. Stable mass is identified in the left breast unchanged. The right breasts stable. Mammographic images were processed with CAD. Targeted ultrasound is performed, showing stable oval hypoechoic lesion at the left breast 3 o'clock 10 cm from nipple measuring 0.7 x 0.4 x 0.6 cm. There is a 1.6 x 0.4 x 0.8 cm oval hypoechoic lesion at left breast 3 o'clock 12 cm from nipple stable compared prior exam. IMPRESSION: Probable benign findings. RECOMMENDATION: Twelve month followup ultrasound left breast. I have discussed the findings and recommendations with the patient. Results were also provided in writing at the conclusion of the visit. If applicable, a reminder letter will be sent to the patient regarding the next appointment. BI-RADS CATEGORY  3: Probably benign. Electronically Signed   By: Abelardo Diesel M.D.   On: 05/17/2017 11:38   Mm Diag Breast Tomo Bilateral  Result Date: 05/17/2017 CLINICAL DATA:  Short-term follow-up left breast masses EXAM: 2D DIGITAL DIAGNOSTIC BILATERAL MAMMOGRAM WITH CAD AND ADJUNCT TOMO ULTRASOUND LEFT BREAST COMPARISON:  Previous exam(s). ACR Breast Density Category b: There are scattered areas of fibroglandular density. FINDINGS: CC and MLO views of bilateral breasts are submitted. Stable mass is identified in the left breast unchanged. The right breasts stable. Mammographic images were processed with CAD. Targeted ultrasound is  performed, showing stable oval hypoechoic lesion at the left breast 3 o'clock 10 cm from nipple measuring 0.7 x 0.4 x 0.6 cm. There is a 1.6 x 0.4 x 0.8 cm oval hypoechoic lesion at left breast 3 o'clock 12 cm from nipple stable compared prior exam. IMPRESSION: Probable benign findings. RECOMMENDATION: Twelve month followup ultrasound left breast. I have discussed the findings and recommendations with the patient. Results were also provided in writing at the conclusion of the visit. If applicable, a reminder letter will be sent to the patient regarding the next appointment. BI-RADS CATEGORY  3: Probably benign. Electronically Signed   By: Abelardo Diesel M.D.   On: 05/17/2017 11:38       Assessment & Plan:   Problem List Items Addressed This Visit    Acute sinusitis    Increased sinus congestion and pressure.  Amoxicillin 875mg  bid with  probiotic as outlined.  Mucinex/robitussin as directed.  Saline nasal spray and nasacort as directed.  Follow.       Relevant Medications   amoxicillin (AMOXIL) 875 MG tablet       [name], MD

## 2018-03-09 NOTE — Telephone Encounter (Signed)
See if she can come in this pm for work in appt for sinus infection.

## 2018-03-09 NOTE — Patient Instructions (Signed)
nasacort nasal spray - 2 sprays each nostril one time per day.  Do this in the evening.    Saline nasal spray - flush nose at least 2-3x/day  mucinex in the am and robitussin DM in the pm.

## 2018-03-09 NOTE — Telephone Encounter (Signed)
Pt scheduled for today.  

## 2018-03-10 ENCOUNTER — Ambulatory Visit: Payer: BC Managed Care – PPO | Admitting: Internal Medicine

## 2018-03-12 ENCOUNTER — Encounter: Payer: Self-pay | Admitting: Internal Medicine

## 2018-03-12 NOTE — Assessment & Plan Note (Signed)
Increased sinus congestion and pressure.  Amoxicillin 875mg  bid with probiotic as outlined.  Mucinex/robitussin as directed.  Saline nasal spray and nasacort as directed.  Follow.

## 2018-03-19 ENCOUNTER — Other Ambulatory Visit: Payer: Self-pay | Admitting: Internal Medicine

## 2018-04-18 ENCOUNTER — Other Ambulatory Visit: Payer: Self-pay | Admitting: Internal Medicine

## 2018-05-10 ENCOUNTER — Telehealth: Payer: BC Managed Care – PPO | Admitting: Family

## 2018-05-10 DIAGNOSIS — J019 Acute sinusitis, unspecified: Secondary | ICD-10-CM

## 2018-05-10 MED ORDER — AMOXICILLIN-POT CLAVULANATE 875-125 MG PO TABS
1.0000 | ORAL_TABLET | Freq: Two times a day (BID) | ORAL | 0 refills | Status: DC
Start: 2018-05-10 — End: 2018-06-30

## 2018-05-10 NOTE — Progress Notes (Signed)

## 2018-05-23 ENCOUNTER — Telehealth: Payer: BC Managed Care – PPO | Admitting: Family

## 2018-05-23 DIAGNOSIS — B3731 Acute candidiasis of vulva and vagina: Secondary | ICD-10-CM

## 2018-05-23 DIAGNOSIS — B373 Candidiasis of vulva and vagina: Secondary | ICD-10-CM

## 2018-05-23 DIAGNOSIS — N390 Urinary tract infection, site not specified: Secondary | ICD-10-CM

## 2018-05-23 MED ORDER — FLUCONAZOLE 150 MG PO TABS: 150.0000 mg | ORAL_TABLET | Freq: Once | ORAL | 0 refills | Status: AC

## 2018-05-23 MED ORDER — CEPHALEXIN 500 MG PO CAPS: 500.0000 mg | ORAL_CAPSULE | Freq: Two times a day (BID) | ORAL | 0 refills | Status: DC

## 2018-05-23 NOTE — Progress Notes (Signed)
Thank you for the details you included in the comment boxes. Those details are very helpful in determining the best course of treatment for you and help [place] to provide the best care.  We are sorry that you are not feeling well.  Here is how we plan to help!  Based on what you shared with me it looks like you most likely have a simple urinary tract infection.  A UTI (Urinary Tract Infection) is a bacterial infection of the bladder.  Most cases of urinary tract infections are simple to treat but a key part of your care is to encourage you to drink plenty of fluids and watch your symptoms carefully.  I have prescribed Keflex 500 mg twice a day for 7 days.  Your symptoms should gradually improve. Call us if the burning in your urine worsens, you develop worsening fever, back pain or pelvic pain or if your symptoms do not resolve after completing the antibiotic.I have also sent the Diflucan as you requested.   Urinary tract infections can be prevented by drinking plenty of water to keep your body hydrated.  Also be sure when you wipe, wipe from front to back and don't hold it in!  If possible, empty your bladder every 4 hours.  Your e-visit answers were reviewed by a board certified advanced clinical practitioner to complete your personal care plan.  Depending on the condition, your plan could have included both over the counter or prescription medications.  If there is a problem please reply  once you have received a response from your provider.  Your safety is important to [place].  If you have drug allergies check your prescription carefully.    You can use MyChart to ask questions about today's visit, request a non-urgent call back, or ask for a work or school excuse for 24 hours related to this e-Visit. If it has been greater than 24 hours you will need to follow up with your provider, or enter a new e-Visit to address those concerns.   You will get an e-mail in the next two days asking about your  experience.  I hope that your e-visit has been valuable and will speed your recovery. Thank you for using e-visits.

## 2018-06-12 ENCOUNTER — Encounter: Payer: Self-pay | Admitting: Internal Medicine

## 2018-06-12 ENCOUNTER — Other Ambulatory Visit: Payer: Self-pay | Admitting: Internal Medicine

## 2018-06-12 DIAGNOSIS — N63 Unspecified lump in unspecified breast: Secondary | ICD-10-CM

## 2018-06-26 ENCOUNTER — Encounter: Payer: Self-pay | Admitting: Internal Medicine

## 2018-06-27 NOTE — Telephone Encounter (Signed)
I will need to see her to complete form.  Can she come in 06/30/18 at 8:00.  Thanks

## 2018-06-28 NOTE — Telephone Encounter (Signed)
Pt scheduled  

## 2018-06-30 ENCOUNTER — Ambulatory Visit: Payer: BC Managed Care – PPO | Admitting: Internal Medicine

## 2018-06-30 VITALS — BP 104/62 | HR 70 | Temp 98.2°F | Resp 18 | Wt 137.2 lb

## 2018-06-30 DIAGNOSIS — Z111 Encounter for screening for respiratory tuberculosis: Secondary | ICD-10-CM | POA: Diagnosis not present

## 2018-06-30 DIAGNOSIS — Z0184 Encounter for antibody response examination: Secondary | ICD-10-CM

## 2018-06-30 DIAGNOSIS — Z9109 Other allergy status, other than to drugs and biological substances: Secondary | ICD-10-CM | POA: Diagnosis not present

## 2018-06-30 DIAGNOSIS — J4520 Mild intermittent asthma, uncomplicated: Secondary | ICD-10-CM

## 2018-06-30 DIAGNOSIS — E039 Hypothyroidism, unspecified: Secondary | ICD-10-CM | POA: Diagnosis not present

## 2018-06-30 DIAGNOSIS — Z0289 Encounter for other administrative examinations: Secondary | ICD-10-CM | POA: Diagnosis not present

## 2018-06-30 NOTE — Progress Notes (Signed)
Patient ID: Angela Pacheco, female   DOB: [birth year], 52 y.o.   MRN: [id number]   Subjective:    Patient ID: Angela Pacheco, female    DOB: 1966/08/20, 52 y.o.   MRN: [id number]  HPI  Patient here as a work in to have forms completed.  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  Needs work form completed.  Unsure when last tetanus, but had bad reaction with last tetanus shot.  PPD placed today.  Will try to get a copy of her previous immunizations.  Discussed checking MMR antibodies.  Stays active.  No chest pain.  No sob.  No acid reflux.  No abdominal pain.  Bowels moving.  Had to cancel her colonoscopy.  Plans to reschedule.     Past Medical History:  Diagnosis Date  . Allergy    cats trigger  . Asthma    mild  . Thyroid disease    Past Surgical History:  Procedure Laterality Date  . ABDOMINAL HYSTERECTOMY     cervix left in place  . APPENDECTOMY  1991  . CHOLECYSTECTOMY     Family History  Problem Relation Age of Onset  . Arthritis Mother    Social History   Socioeconomic History  . Marital status: Married    Spouse name: Not on file  . Number of children: Not on file  . Years of education: Not on file  . Highest education level: Not on file  Occupational History  . Not on file  Social Needs  . Financial resource strain: Not on file  . Food insecurity:    Worry: Not on file    Inability: Not on file  . Transportation needs:    Medical: Not on file    Non-medical: Not on file  Tobacco Use  . Smoking status: Never Smoker  . Smokeless tobacco: Never Used  Substance and Sexual Activity  . Alcohol use: Yes    Alcohol/week: 0.0 oz    Comment: occ.  . Drug use: No  . Sexual activity: Not on file  Lifestyle  . Physical activity:    Days per week: Not on file    Minutes per session: Not on file  . Stress: Not on file  Relationships  . Social connections:    Talks on phone: Not on file    Gets together: Not on file    Attends religious  service: Not on file    Active member of club or organization: Not on file    Attends meetings of clubs or organizations: Not on file    Relationship status: Not on file  Other Topics Concern  . Not on file  Social History Narrative  . Not on file    Outpatient Encounter Medications as of 06/30/2018  Medication Sig  . fluticasone (FLONASE) 50 MCG/ACT nasal spray Place 2 sprays into both nostrils daily.  Marland Kitchen levothyroxine (SYNTHROID, LEVOTHROID) 88 MCG tablet TAKE 1 TABLET(88 MCG) BY MOUTH DAILY  . loratadine (CLARITIN) 10 MG tablet Take 10 mg by mouth daily.  . [DISCONTINUED] amoxicillin (AMOXIL) 875 MG tablet Take 1 tablet (875 mg total) by mouth 2 (two) times daily.  . [DISCONTINUED] amoxicillin-clavulanate (AUGMENTIN) 875-125 MG tablet Take 1 tablet by mouth 2 (two) times daily.  . [DISCONTINUED] cephALEXin (KEFLEX) 500 MG capsule Take 1 capsule (500 mg total) by mouth 2 (two) times daily.  . [DISCONTINUED] Zolpidem Tartrate (AMBIEN PO) Take by mouth.   No facility-administered encounter medications on file  as of 06/30/2018.     Review of Systems  Constitutional: Negative for appetite change and unexpected weight change.  HENT: Negative for congestion and sinus pressure.   Respiratory: Negative for cough, chest tightness and shortness of breath.   Cardiovascular: Negative for chest pain, palpitations and leg swelling.  Gastrointestinal: Negative for abdominal pain, diarrhea, nausea and vomiting.  Genitourinary: Negative for difficulty urinating and dysuria.  Musculoskeletal: Negative for joint swelling and myalgias.  Skin: Negative for color change and rash.  Neurological: Negative for dizziness, light-headedness and headaches.  Psychiatric/Behavioral: Negative for agitation and dysphoric mood.       Objective:    Physical Exam  Constitutional: She appears well-developed and well-nourished. No distress.  HENT:  Nose: Nose normal.  Mouth/Throat: Oropharynx is clear and moist.    Neck: Neck supple. No thyromegaly present.  Cardiovascular: Normal rate and regular rhythm.  Pulmonary/Chest: Breath sounds normal. No respiratory distress. She has no wheezes.  Abdominal: Soft. Bowel sounds are normal. There is no tenderness.  Musculoskeletal: She exhibits no edema or tenderness.  Lymphadenopathy:    She has no cervical adenopathy.  Skin: No rash noted. No erythema.  Psychiatric: She has a normal mood and affect. Her behavior is normal.    BP 104/62 (BP Location: Left Arm, Patient Position: Sitting, Cuff Size: Normal)   Pulse 70   Temp 98.2 F (36.8 C) (Oral)   Resp 18   Wt 137 lb 3.2 oz (62.2 kg)   SpO2 98%   BMI 26.80 kg/m  Wt Readings from Last 3 Encounters:  06/30/18 137 lb 3.2 oz (62.2 kg)  03/09/18 137 lb (62.1 kg)  09/26/17 140 lb 12.8 oz (63.9 kg)     Lab Results  Component Value Date   WBC 8.6 04/02/2016   HGB 14.9 04/02/2016   HCT 43.5 04/02/2016   PLT 349.0 04/02/2016   GLUCOSE 87 04/02/2016   CHOL 189 04/02/2016   TRIG 93.0 04/02/2016   HDL 53.30 04/02/2016   LDLCALC 117 (H) 04/02/2016   ALT 24 04/02/2016   AST 20 04/02/2016   NA 139 04/02/2016   K 4.0 04/02/2016   CL 105 04/02/2016   CREATININE 0.81 04/02/2016   BUN 14 04/02/2016   CO2 27 04/02/2016   TSH 2.86 02/06/2018    US Breast Ltd Uni Left Inc Axilla  Result Date: 05/17/2017 CLINICAL DATA:  Short-term follow-up left breast masses EXAM: 2D DIGITAL DIAGNOSTIC BILATERAL MAMMOGRAM WITH CAD AND ADJUNCT TOMO ULTRASOUND LEFT BREAST COMPARISON:  Previous exam(s). ACR Breast Density Category b: There are scattered areas of fibroglandular density. FINDINGS: CC and MLO views of bilateral breasts are submitted. Stable mass is identified in the left breast unchanged. The right breasts stable. Mammographic images were processed with CAD. Targeted ultrasound is performed, showing stable oval hypoechoic lesion at the left breast 3 o'clock 10 cm from nipple measuring 0.7 x 0.4 x 0.6 cm. There  is a 1.6 x 0.4 x 0.8 cm oval hypoechoic lesion at left breast 3 o'clock 12 cm from nipple stable compared prior exam. IMPRESSION: Probable benign findings. RECOMMENDATION: Twelve month followup ultrasound left breast. I have discussed the findings and recommendations with the patient. Results were also provided in writing at the conclusion of the visit. If applicable, a reminder letter will be sent to the patient regarding the next appointment. BI-RADS CATEGORY  3: Probably benign. Electronically Signed   By: Abelardo Diesel M.D.   On: 05/17/2017 11:38   Mm Diag Breast Tomo Bilateral  Result Date: 05/17/2017 CLINICAL DATA:  Short-term follow-up left breast masses EXAM: 2D DIGITAL DIAGNOSTIC BILATERAL MAMMOGRAM WITH CAD AND ADJUNCT TOMO ULTRASOUND LEFT BREAST COMPARISON:  Previous exam(s). ACR Breast Density Category b: There are scattered areas of fibroglandular density. FINDINGS: CC and MLO views of bilateral breasts are submitted. Stable mass is identified in the left breast unchanged. The right breasts stable. Mammographic images were processed with CAD. Targeted ultrasound is performed, showing stable oval hypoechoic lesion at the left breast 3 o'clock 10 cm from nipple measuring 0.7 x 0.4 x 0.6 cm. There is a 1.6 x 0.4 x 0.8 cm oval hypoechoic lesion at left breast 3 o'clock 12 cm from nipple stable compared prior exam. IMPRESSION: Probable benign findings. RECOMMENDATION: Twelve month followup ultrasound left breast. I have discussed the findings and recommendations with the patient. Results were also provided in writing at the conclusion of the visit. If applicable, a reminder letter will be sent to the patient regarding the next appointment. BI-RADS CATEGORY  3: Probably benign. Electronically Signed   By: Abelardo Diesel M.D.   On: 05/17/2017 11:38       Assessment & Plan:   Problem List Items Addressed This Visit    Asthma    Breathing stable.        Environmental allergies    Controlled.          Hypothyroidism    On thyroid replacement.  Follow tsh.       Immunity to measles determined by serologic test    Check MMR titer.        Relevant Orders   Measles/Mumps/Rubella Immunity    Other Visit Diagnoses    Encounter for completion of form with patient    -  Primary   Form completed.  PPD placed.  To be read Monday 07/03/18.  Obtain record of previous immunizations.  Check MMR titers.        [name], MD

## 2018-07-02 ENCOUNTER — Encounter: Payer: Self-pay | Admitting: Internal Medicine

## 2018-07-02 NOTE — Assessment & Plan Note (Signed)
Check MMR titer.

## 2018-07-02 NOTE — Assessment & Plan Note (Signed)
Controlled.  

## 2018-07-02 NOTE — Assessment & Plan Note (Signed)
On thyroid replacement.  Follow tsh.  

## 2018-07-02 NOTE — Assessment & Plan Note (Signed)
Breathing stable.

## 2018-07-03 ENCOUNTER — Ambulatory Visit: Payer: BC Managed Care – PPO | Admitting: *Deleted

## 2018-07-03 DIAGNOSIS — Z111 Encounter for screening for respiratory tuberculosis: Secondary | ICD-10-CM

## 2018-07-03 LAB — MEASLES/MUMPS/RUBELLA IMMUNITY
MUMPS IGG: 18.2 [AU]/ml
RUBELLA: 7.72 {index}
Rubeola IgG: >300 AU/mL

## 2018-07-04 LAB — TB SKIN TEST
Induration: 0 mm
TB Skin Test: NEGATIVE

## 2018-07-04 NOTE — Progress Notes (Signed)
PPD reading negative

## 2018-07-04 NOTE — Addendum Note (Signed)
Addended by: Nanci Pina on: 07/04/2018 09:07 AM   Modules accepted: Orders

## 2018-07-10 ENCOUNTER — Ambulatory Visit
Admission: RE | Admit: 2018-07-10 | Discharge: 2018-07-10 | Disposition: A | Discharge status: Home / Self Care | Payer: BC Managed Care – PPO | Source: Ambulatory Visit | Attending: Internal Medicine | Admitting: Internal Medicine

## 2018-07-10 DIAGNOSIS — N63 Unspecified lump in unspecified breast: Secondary | ICD-10-CM

## 2018-07-26 ENCOUNTER — Encounter: Payer: Self-pay | Admitting: Internal Medicine

## 2018-09-15 ENCOUNTER — Ambulatory Visit: Payer: BC Managed Care – PPO | Admitting: Internal Medicine

## 2018-09-15 ENCOUNTER — Encounter: Payer: Self-pay | Admitting: Internal Medicine

## 2018-09-15 DIAGNOSIS — R253 Fasciculation: Secondary | ICD-10-CM | POA: Diagnosis not present

## 2018-09-15 DIAGNOSIS — R519 Headache, unspecified: Secondary | ICD-10-CM

## 2018-09-15 DIAGNOSIS — R51 Headache: Secondary | ICD-10-CM

## 2018-09-15 DIAGNOSIS — M2550 Pain in unspecified joint: Secondary | ICD-10-CM

## 2018-09-15 DIAGNOSIS — Z9109 Other allergy status, other than to drugs and biological substances: Secondary | ICD-10-CM | POA: Diagnosis not present

## 2018-09-15 LAB — COMPREHENSIVE METABOLIC PANEL
ALT: 21 U/L (ref 0–35)
AST: 19 U/L (ref 0–37)
Albumin: 4.3 g/dL (ref 3.5–5.2)
Alkaline Phosphatase: 87 U/L (ref 39–117)
BUN: 19 mg/dL (ref 6–23)
CALCIUM: 9.8 mg/dL (ref 8.4–10.5)
CO2: 27 meq/L (ref 19–32)
Chloride: 106 mEq/L (ref 96–112)
Creatinine, Ser: 0.78 mg/dL (ref 0.40–1.20)
GFR: 82.92 mL/min (ref >60.00)
Glucose, Bld: 85 mg/dL (ref 70–99)
POTASSIUM: 3.9 meq/L (ref 3.5–5.1)
Sodium: 141 mEq/L (ref 135–145)
Total Bilirubin: 0.7 mg/dL (ref 0.2–1.2)
Total Protein: 7.3 g/dL (ref 6.0–8.3)

## 2018-09-15 LAB — CBC WITH DIFFERENTIAL/PLATELET
BASOS PCT: 0.4 % (ref 0.0–3.0)
Basophils Absolute: 0 10*3/uL (ref 0.0–0.1)
EOS PCT: 2.1 % (ref 0.0–5.0)
Eosinophils Absolute: 0.2 10*3/uL (ref 0.0–0.7)
HCT: 42.7 % (ref 36.0–46.0)
Hemoglobin: 14.7 g/dL (ref 12.0–15.0)
LYMPHS ABS: 2.6 10*3/uL (ref 0.7–4.0)
Lymphocytes Relative: 31.1 % (ref 12.0–46.0)
MCHC: 34.4 g/dL (ref 30.0–36.0)
MCV: 90 fl (ref 78.0–100.0)
MONO ABS: 0.5 10*3/uL (ref 0.1–1.0)
MONOS PCT: 6.3 % (ref 3.0–12.0)
NEUTROS ABS: 5.1 10*3/uL (ref 1.4–7.7)
NEUTROS PCT: 60.1 % (ref 43.0–77.0)
PLATELETS: 328 10*3/uL (ref 150.0–400.0)
RBC: 4.74 Mil/uL (ref 3.87–5.11)
RDW: 12.8 % (ref 11.5–15.5)
WBC: 8.4 10*3/uL (ref 4.0–10.5)

## 2018-09-15 LAB — C-REACTIVE PROTEIN: CRP: 0.3 mg/dL — ABNORMAL LOW (ref 0.5–20.0)

## 2018-09-15 LAB — SEDIMENTATION RATE: Sed Rate: 5 mm/hr (ref 0–30)

## 2018-09-15 LAB — TSH: TSH: 3.34 u[IU]/mL (ref 0.35–4.50)

## 2018-09-15 NOTE — Telephone Encounter (Signed)
Ok to schedule at 12:00 today.

## 2018-09-15 NOTE — Progress Notes (Signed)
Patient ID: Angela Pacheco, female   DOB: Dec 20, 1966, 52 y.o.   MRN: [id number]   Subjective:    Patient ID: Angela Pacheco, female    DOB: [birth year], 52 y.o.   MRN: [id number]  HPI  Patient here as a work in with concerns regarding some ongoing issues with eye twitching and some right side head pressure.  She reports that her sister and her mother have been diagnosed with trigeminal neuralgia.  Her sister reported that her eye had been twitching and she was then subsequently diagnosed.  She reports she has had persistent issues with eye twitching - only the right eye.   Does also feel like there is increased pressure behind her right eye.  Due f/u with eye MD.  Discussed the need for eye evaluation.  She is off with her sleep patterns.  Has noticed some right side head pressure.  Does have some intermittent sinus congestion.  No fever.  No rash.  Some change with some noticed of decreased hearing and smell.  No dizziness.  No chest pain. No sob.  Eating.  No nausea or vomiting.  Also reports that her sister has lupus and OA.  Brother with lupus anti coagulant.  Niece with lupus.  Father with diabetes.  She is concerned with her symptoms and her family history.     Past Medical History:  Diagnosis Date  . Allergy    cats trigger  . Asthma    mild  . Thyroid disease    Past Surgical History:  Procedure Laterality Date  . ABDOMINAL HYSTERECTOMY     cervix left in place  . APPENDECTOMY  1991  . CHOLECYSTECTOMY     Family History  Problem Relation Age of Onset  . Arthritis Mother   . Breast cancer Neg Hx    Social History   Socioeconomic History  . Marital status: Married    Spouse name: Not on file  . Number of children: Not on file  . Years of education: Not on file  . Highest education level: Not on file  Occupational History  . Not on file  Social Needs  . Financial resource strain: Not on file  . Food insecurity:    Worry: Not on file    Inability: Not on  file  . Transportation needs:    Medical: Not on file    Non-medical: Not on file  Tobacco Use  . Smoking status: Never Smoker  . Smokeless tobacco: Never Used  Substance and Sexual Activity  . Alcohol use: Yes    Alcohol/week: 0.0 standard drinks    Comment: occ.  . Drug use: No  . Sexual activity: Not on file  Lifestyle  . Physical activity:    Days per week: Not on file    Minutes per session: Not on file  . Stress: Not on file  Relationships  . Social connections:    Talks on phone: Not on file    Gets together: Not on file    Attends religious service: Not on file    Active member of club or organization: Not on file    Attends meetings of clubs or organizations: Not on file    Relationship status: Not on file  Other Topics Concern  . Not on file  Social History Narrative  . Not on file    Outpatient Encounter Medications as of 09/15/2018  Medication Sig  . fluticasone (FLONASE) 50 MCG/ACT nasal spray Place 2 sprays into both nostrils daily.  Marland Kitchen  levothyroxine (SYNTHROID, LEVOTHROID) 88 MCG tablet TAKE 1 TABLET(88 MCG) BY MOUTH DAILY  . loratadine (CLARITIN) 10 MG tablet Take 10 mg by mouth daily.   No facility-administered encounter medications on file as of 09/15/2018.     Review of Systems  Constitutional: Negative for appetite change and fever.  HENT: Positive for congestion. Negative for sore throat.   Respiratory: Negative for cough, chest tightness and shortness of breath.   Cardiovascular: Negative for chest pain, palpitations and leg swelling.  Gastrointestinal: Negative for abdominal pain, diarrhea, nausea and vomiting.  Genitourinary: Negative for difficulty urinating and dysuria.  Musculoskeletal: Negative for joint swelling and myalgias.  Skin: Negative for color change and rash.  Neurological: Negative for dizziness.       Right side head pain and pressure as outlined.    Psychiatric/Behavioral: Negative for agitation and dysphoric mood.         Objective:    Physical Exam  Constitutional: She appears well-developed and well-nourished. No distress.  HENT:  Nose: Nose normal.  Mouth/Throat: Oropharynx is clear and moist.  TMs without erythema.  Minimal tenderness to palpation right temple - right side of head.    Neck: Neck supple. No thyromegaly present.  Cardiovascular: Normal rate and regular rhythm.  Pulmonary/Chest: Breath sounds normal. No respiratory distress. She has no wheezes.  Abdominal: Soft. Bowel sounds are normal. There is no tenderness.  Musculoskeletal: She exhibits no edema or tenderness.  Lymphadenopathy:    She has no cervical adenopathy.  Skin: No rash noted. No erythema.  Psychiatric: She has a normal mood and affect. Her behavior is normal.    BP 112/70 (BP Location: Left Arm, Patient Position: Sitting, Cuff Size: Normal)   Pulse 83   Temp 98 F (36.7 C) (Oral)   Resp 18   Wt 138 lb 12.8 oz (63 kg)   SpO2 98%   BMI 27.11 kg/m  Wt Readings from Last 3 Encounters:  09/15/18 138 lb 12.8 oz (63 kg)  06/30/18 137 lb 3.2 oz (62.2 kg)  03/09/18 137 lb (62.1 kg)     Lab Results  Component Value Date   WBC 8.4 09/15/2018   HGB 14.7 09/15/2018   HCT 42.7 09/15/2018   PLT 328.0 09/15/2018   GLUCOSE 85 09/15/2018   CHOL 189 04/02/2016   TRIG 93.0 04/02/2016   HDL 53.30 04/02/2016   LDLCALC 117 (H) 04/02/2016   ALT 21 09/15/2018   AST 19 09/15/2018   NA 141 09/15/2018   K 3.9 09/15/2018   CL 106 09/15/2018   CREATININE 0.78 09/15/2018   BUN 19 09/15/2018   CO2 27 09/15/2018   TSH 3.34 09/15/2018    US Breast Ltd Uni Left Inc Axilla  Result Date: 07/10/2018 CLINICAL DATA:  Follow-up for probably benign LEFT breast masses.These LEFT breast masses were originally identified on 05/17/2016. EXAM: DIGITAL DIAGNOSTIC BILATERAL MAMMOGRAM WITH CAD AND TOMO ULTRASOUND LEFT BREAST COMPARISON:  Previous exam(s). ACR Breast Density Category b: There are scattered areas of fibroglandular density.  FINDINGS: There are no new dominant masses, suspicious calcifications or secondary signs of malignancy within either breast. Overall fibroglandular pattern is stable bilaterally. Mammographic images were processed with CAD. Targeted ultrasound is performed, showing a stable oval circumscribed hypoechoic mass in the LEFT breast at the 3 o'clock axis, 10 cm from the nipple, measuring 7 mm. There is also a stable oval circumscribed hypoechoic mass in the LEFT breast at the 3 o'clock axis, 12 cm from the nipple, measuring 1.6 cm. 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 IMPRESSION: No evidence of malignancy within either breast. 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 Patient may now return to routine annual bilateral screening mammogram schedule. RECOMMENDATION: Screening mammogram in one year.(Code:SM-B-01Y) I have discussed the findings and recommendations with the patient. Results were also provided in writing at the conclusion of the visit. If applicable, a reminder letter will be sent to the patient regarding the next appointment. BI-RADS CATEGORY  2: Benign. Electronically Signed   By: Franki Cabot M.D.   On: 07/10/2018 15:08   Mm Diag Breast Tomo Bilateral  Result Date: 07/10/2018 CLINICAL DATA:  Follow-up for probably benign LEFT breast masses.These LEFT breast masses were originally identified on 05/17/2016. EXAM: DIGITAL DIAGNOSTIC BILATERAL MAMMOGRAM WITH CAD AND TOMO ULTRASOUND LEFT BREAST COMPARISON:  Previous exam(s). ACR Breast Density Category b: There are scattered areas of fibroglandular density. FINDINGS: There are no new dominant masses, suspicious calcifications or secondary signs of malignancy within either breast. Overall fibroglandular pattern is stable bilaterally. Mammographic images were processed with CAD. Targeted ultrasound  is performed, showing a stable oval circumscribed hypoechoic mass in the LEFT breast at the 3 o'clock axis, 10 cm from the nipple, measuring 7 mm. There is also a stable oval circumscribed hypoechoic mass in the LEFT breast at the 3 o'clock axis, 12 cm from the nipple, measuring 1.6 cm. 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 IMPRESSION: No evidence of malignancy within either breast. 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 Patient may now return to routine annual bilateral screening mammogram schedule. RECOMMENDATION: Screening mammogram in one year.(Code:SM-B-01Y) I have discussed the findings and recommendations with the patient. Results were also provided in writing at the conclusion of the visit. If applicable, a reminder letter will be sent to the patient regarding the next appointment. BI-RADS CATEGORY  2: Benign. Electronically Signed   By: Franki Cabot M.D.   On: 07/10/2018 15:08       Assessment & Plan:   Problem List Items Addressed This Visit    Environmental allergies    Intermittent nasal congestion and pressure.  Unclear if the right side headache and pressure could be intermittent worsening sinus congestion.  Discussed saline nasal spray and nasacort as directed.        Eye muscle twitches    Persistent eyelid twitching.  Discussed eye strain and fatigue.  Will have her eye doctor evaluate.        Headache    Right side head pain as outlined.  Treat allergies.  See if smeall and taste improve.  Check routine labs.  Also check esr.  Have her f/u with her eye doctor given eyelid twitching and pressure noted behind the right eyd.  Discussed further w/up, including MRI.  Will start with above testing first.        Relevant Orders   CBC with Differential/Platelet (Completed)   TSH (Completed)   Comprehensive metabolic  panel (Completed)   C-reactive protein (Completed)   Joint pain    Notices after eating wheat and gluten.  Check esr ,etc.        Relevant Orders   Sedimentation rate (Completed)   Rheumatoid factor (Completed)   ANA       [name], MD

## 2018-09-15 NOTE — Telephone Encounter (Signed)
This is the patient we discussed this morning. Ok to put her in at 57?

## 2018-09-15 NOTE — Telephone Encounter (Signed)
Pt scheduled  

## 2018-09-18 ENCOUNTER — Encounter: Payer: Self-pay | Admitting: Internal Medicine

## 2018-09-18 DIAGNOSIS — R253 Fasciculation: Secondary | ICD-10-CM | POA: Insufficient documentation

## 2018-09-18 NOTE — Assessment & Plan Note (Addendum)
Right side head pain as outlined.  Treat allergies.  See if smeall and taste improve.  Check routine labs.  Also check esr.  Have her f/u with her eye doctor given eyelid twitching and pressure noted behind the right eyd.  Discussed further w/up, including MRI.  Will start with above testing first.

## 2018-09-18 NOTE — Assessment & Plan Note (Signed)
Intermittent nasal congestion and pressure.  Unclear if the right side headache and pressure could be intermittent worsening sinus congestion.  Discussed saline nasal spray and nasacort as directed.

## 2018-09-18 NOTE — Assessment & Plan Note (Signed)
Persistent eyelid twitching.  Discussed eye strain and fatigue.  Will have her eye doctor evaluate.

## 2018-09-18 NOTE — Assessment & Plan Note (Signed)
Notices after eating wheat and gluten.  Check esr ,etc.

## 2018-09-19 ENCOUNTER — Encounter: Payer: Self-pay | Admitting: Internal Medicine

## 2018-09-19 LAB — ANTI-NUCLEAR AB-TITER (ANA TITER)
ANA TITER: 1:80 {titer} — ABNORMAL HIGH
ANA Titer 1: 1:80 {titer} — ABNORMAL HIGH

## 2018-09-19 LAB — ANA: ANA: POSITIVE — AB

## 2018-09-19 LAB — RHEUMATOID FACTOR: Rhuematoid fact SerPl-aCnc: <14 IU/mL (ref <14)

## 2018-09-20 ENCOUNTER — Encounter: Payer: Self-pay | Admitting: Internal Medicine

## 2018-09-20 DIAGNOSIS — R51 Headache: Secondary | ICD-10-CM

## 2018-09-20 DIAGNOSIS — R519 Headache, unspecified: Secondary | ICD-10-CM

## 2018-09-20 DIAGNOSIS — R768 Other specified abnormal immunological findings in serum: Secondary | ICD-10-CM

## 2018-09-21 NOTE — Telephone Encounter (Signed)
Order placed for neurology referral.   

## 2018-09-21 NOTE — Telephone Encounter (Signed)
Order placed for rheumatology referral.  

## 2018-09-29 ENCOUNTER — Other Ambulatory Visit (HOSPITAL_COMMUNITY)
Admission: RE | Admit: 2018-09-29 | Discharge: 2018-09-29 | Disposition: A | Discharge status: Home / Self Care | Payer: BC Managed Care – PPO | Source: Ambulatory Visit | Attending: Internal Medicine | Admitting: Internal Medicine

## 2018-09-29 ENCOUNTER — Ambulatory Visit (INDEPENDENT_AMBULATORY_CARE_PROVIDER_SITE_OTHER): Payer: BC Managed Care – PPO | Admitting: Internal Medicine

## 2018-09-29 ENCOUNTER — Encounter: Payer: Self-pay | Admitting: Internal Medicine

## 2018-09-29 VITALS — BP 110/70 | HR 71 | Temp 97.5°F | Resp 17 | Ht 61.0 in | Wt 138.4 lb

## 2018-09-29 DIAGNOSIS — E039 Hypothyroidism, unspecified: Secondary | ICD-10-CM

## 2018-09-29 DIAGNOSIS — R51 Headache: Secondary | ICD-10-CM | POA: Diagnosis not present

## 2018-09-29 DIAGNOSIS — R928 Other abnormal and inconclusive findings on diagnostic imaging of breast: Secondary | ICD-10-CM | POA: Diagnosis not present

## 2018-09-29 DIAGNOSIS — Z9109 Other allergy status, other than to drugs and biological substances: Secondary | ICD-10-CM

## 2018-09-29 DIAGNOSIS — Z124 Encounter for screening for malignant neoplasm of cervix: Secondary | ICD-10-CM | POA: Diagnosis not present

## 2018-09-29 DIAGNOSIS — Z0000 Encounter for general adult medical examination without abnormal findings: Secondary | ICD-10-CM | POA: Diagnosis not present

## 2018-09-29 DIAGNOSIS — R519 Headache, unspecified: Secondary | ICD-10-CM

## 2018-09-29 DIAGNOSIS — R253 Fasciculation: Secondary | ICD-10-CM | POA: Diagnosis not present

## 2018-09-29 MED ORDER — LEVOTHYROXINE SODIUM 88 MCG PO TABS: ORAL_TABLET | ORAL | 1 refills | Status: DC

## 2018-09-29 NOTE — Assessment & Plan Note (Signed)
Physical today 09/29/18. PAP 09/29/18.  Mammogram (diagnostic) with ultrasound left breast - Birads II.  Recommended screening mammogram in one year.  Discussed colonoscopy.

## 2018-09-29 NOTE — Progress Notes (Signed)
Patient ID: Angela Pacheco, female   DOB: 1966/09/29, 52 y.o.   MRN: [id number]   Subjective:    Patient ID: Angela Pacheco, female    DOB: 11-12-1966, 52 y.o.   MRN: [id number]  HPI  Patient here for her physical exam.  She reports she had to travel to [place] last week. Left on Friday and returned on Sunday.  After traveling, she noticed some dizziness and nausea.  Felt like going to pass out.  No chest pain.  Eating.  Feeling better now,  Had some residual fatigue.  Legs feel tired.  Arms weak.  Recent concern with family members health history.  See last note for details.  Positive ANA 1:80.  Discussed with her - not sure of clinical significance.  Is scheduled to see rheumatology.     Past Medical History:  Diagnosis Date  . Allergy    cats trigger  . Asthma    mild  . Thyroid disease    Past Surgical History:  Procedure Laterality Date  . ABDOMINAL HYSTERECTOMY     cervix left in place  . APPENDECTOMY  1991  . CHOLECYSTECTOMY     Family History  Problem Relation Age of Onset  . Arthritis Mother   . Breast cancer Neg Hx    Social History   Socioeconomic History  . Marital status: Married    Spouse name: Not on file  . Number of children: Not on file  . Years of education: Not on file  . Highest education level: Not on file  Occupational History  . Not on file  Social Needs  . Financial resource strain: Not on file  . Food insecurity:    Worry: Not on file    Inability: Not on file  . Transportation needs:    Medical: Not on file    Non-medical: Not on file  Tobacco Use  . Smoking status: Never Smoker  . Smokeless tobacco: Never Used  Substance and Sexual Activity  . Alcohol use: Yes    Alcohol/week: 0.0 standard drinks    Comment: occ.  . Drug use: No  . Sexual activity: Not on file  Lifestyle  . Physical activity:    Days per week: Not on file    Minutes per session: Not on file  . Stress: Not on file  Relationships  . Social  connections:    Talks on phone: Not on file    Gets together: Not on file    Attends religious service: Not on file    Active member of club or organization: Not on file    Attends meetings of clubs or organizations: Not on file    Relationship status: Not on file  Other Topics Concern  . Not on file  Social History Narrative  . Not on file    Outpatient Encounter Medications as of 09/29/2018  Medication Sig  . fluticasone (FLONASE) 50 MCG/ACT nasal spray Place 2 sprays into both nostrils daily.  Marland Kitchen levothyroxine (SYNTHROID, LEVOTHROID) 88 MCG tablet One tablet daily  . loratadine (CLARITIN) 10 MG tablet Take 10 mg by mouth daily.  Marland Kitchen OVER THE COUNTER MEDICATION AMBEREN-take once a day  . [DISCONTINUED] levothyroxine (SYNTHROID, LEVOTHROID) 88 MCG tablet TAKE 1 TABLET(88 MCG) BY MOUTH DAILY   No facility-administered encounter medications on file as of 09/29/2018.     Review of Systems  Constitutional: Positive for fatigue. Negative for appetite change and unexpected weight change.  HENT: Negative for congestion and sinus pressure.  Respiratory: Negative for cough, chest tightness and shortness of breath.   Cardiovascular: Negative for chest pain, palpitations and leg swelling.  Gastrointestinal: Positive for nausea. Negative for abdominal pain, diarrhea and vomiting.  Genitourinary: Negative for difficulty urinating and dysuria.  Musculoskeletal: Negative for joint swelling and myalgias.  Skin: Negative for color change and rash.  Neurological: Positive for dizziness.       Right head pressure.  Eye twitching.    Psychiatric/Behavioral: Negative for agitation and dysphoric mood.       Objective:     Blood pressure rechecked by me:  114/78  Physical Exam  Constitutional: She is oriented to person, place, and time. She appears well-developed and well-nourished. No distress.  HENT:  Nose: Nose normal.  Mouth/Throat: Oropharynx is clear and moist.  Eyes: Right eye exhibits  no discharge. Left eye exhibits no discharge. No scleral icterus.  Neck: Neck supple. No thyromegaly present.  Cardiovascular: Normal rate and regular rhythm.  Pulmonary/Chest: Breath sounds normal. No accessory muscle usage. No tachypnea. No respiratory distress. She has no decreased breath sounds. She has no wheezes. She has no rhonchi. Right breast exhibits no inverted nipple, no mass, no nipple discharge and no tenderness (no axillary adenopathy). Left breast exhibits no inverted nipple, no mass, no nipple discharge and no tenderness (no axilarry adenopathy).  Abdominal: Soft. Bowel sounds are normal. There is no tenderness.  Genitourinary:  Genitourinary Comments: Normal external genitalia.  Vaginal vault without lesions.  Cervix identified.  Pap smear performed.  Could not appreciate any adnexal masses or tenderness.    Musculoskeletal: She exhibits no edema or tenderness.  Lymphadenopathy:    She has no cervical adenopathy.  Neurological: She is alert and oriented to person, place, and time.  Skin: No rash noted. No erythema.  Psychiatric: She has a normal mood and affect. Her behavior is normal.    BP 110/70   Pulse 71   Temp (!) 97.5 F (36.4 C) (Oral)   Resp 17   Ht 5\' 1"  (1.549 m)   Wt 138 lb 6 oz (62.8 kg)   SpO2 99%   BMI 26.15 kg/m  Wt Readings from Last 3 Encounters:  09/29/18 138 lb 6 oz (62.8 kg)  09/15/18 138 lb 12.8 oz (63 kg)  06/30/18 137 lb 3.2 oz (62.2 kg)     Lab Results  Component Value Date   WBC 8.4 09/15/2018   HGB 14.7 09/15/2018   HCT 42.7 09/15/2018   PLT 328.0 09/15/2018   GLUCOSE 85 09/15/2018   CHOL 189 04/02/2016   TRIG 93.0 04/02/2016   HDL 53.30 04/02/2016   LDLCALC 117 (H) 04/02/2016   ALT 21 09/15/2018   AST 19 09/15/2018   NA 141 09/15/2018   K 3.9 09/15/2018   CL 106 09/15/2018   CREATININE 0.78 09/15/2018   BUN 19 09/15/2018   CO2 27 09/15/2018   TSH 3.34 09/15/2018    US Breast Ltd Uni Left Inc Axilla  Result Date:  07/10/2018 CLINICAL DATA:  Follow-up for probably benign LEFT breast masses.These LEFT breast masses were originally identified on 05/17/2016. EXAM: DIGITAL DIAGNOSTIC BILATERAL MAMMOGRAM WITH CAD AND TOMO ULTRASOUND LEFT BREAST COMPARISON:  Previous exam(s). ACR Breast Density Category b: There are scattered areas of fibroglandular density. FINDINGS: There are no new dominant masses, suspicious calcifications or secondary signs of malignancy within either breast. Overall fibroglandular pattern is stable bilaterally. Mammographic images were processed with CAD. Targeted ultrasound is performed, showing a stable oval circumscribed hypoechoic mass in  the LEFT breast at the 3 o'clock axis, 10 cm from the nipple, measuring 7 mm. There is also a stable oval circumscribed hypoechoic mass in the LEFT breast at the 3 o'clock axis, 12 cm from the nipple, measuring 1.6 cm. 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 IMPRESSION: No evidence of malignancy within either breast. 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 Patient may now return to routine annual bilateral screening mammogram schedule. RECOMMENDATION: Screening mammogram in one year.(Code:SM-B-01Y) I have discussed the findings and recommendations with the patient. Results were also provided in writing at the conclusion of the visit. If applicable, a reminder letter will be sent to the patient regarding the next appointment. BI-RADS CATEGORY  2: Benign. Electronically Signed   By: Franki Cabot M.D.   On: 07/10/2018 15:08   Mm Diag Breast Tomo Bilateral  Result Date: 07/10/2018 CLINICAL DATA:  Follow-up for probably benign LEFT breast masses.These LEFT breast masses were originally identified on 05/17/2016. EXAM: DIGITAL DIAGNOSTIC BILATERAL MAMMOGRAM WITH CAD AND TOMO ULTRASOUND LEFT BREAST COMPARISON:   Previous exam(s). ACR Breast Density Category b: There are scattered areas of fibroglandular density. FINDINGS: There are no new dominant masses, suspicious calcifications or secondary signs of malignancy within either breast. Overall fibroglandular pattern is stable bilaterally. Mammographic images were processed with CAD. Targeted ultrasound is performed, showing a stable oval circumscribed hypoechoic mass in the LEFT breast at the 3 o'clock axis, 10 cm from the nipple, measuring 7 mm. There is also a stable oval circumscribed hypoechoic mass in the LEFT breast at the 3 o'clock axis, 12 cm from the nipple, measuring 1.6 cm. 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 IMPRESSION: No evidence of malignancy within either breast. 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 Patient may now return to routine annual bilateral screening mammogram schedule. RECOMMENDATION: Screening mammogram in one year.(Code:SM-B-01Y) I have discussed the findings and recommendations with the patient. Results were also provided in writing at the conclusion of the visit. If applicable, a reminder letter will be sent to the patient regarding the next appointment. BI-RADS CATEGORY  2: Benign. Electronically Signed   By: Franki Cabot M.D.   On: 07/10/2018 15:08       Assessment & Plan:   Problem List Items Addressed This Visit    Abnormal mammogram    Bilateral diagnostic mammogram 07/10/18 birads II.  Recommended f/u in one year.        Environmental allergies    Controlled on current regimen.  Follow.       Eye muscle twitches    Persistent.  Eye MD evaluation.       Headache    Persistent head pain as outlined in previous note.  Treating allergies.  Has decreased smell and taste.  Now with episode of dizziness, etc.  Given persistence, will obtain MRI brain.   Consider neurology evaluation.        Relevant Orders   MR [place] care maintenance    Physical today 09/29/18. PAP 09/29/18.  Mammogram (diagnostic) with ultrasound left breast - Birads II.  Recommended screening mammogram in one year.  Discussed colonoscopy.        Hypothyroidism    On thyroid replacement.  Follow tsh.  Relevant Medications   levothyroxine (SYNTHROID, LEVOTHROID) 88 MCG tablet    Other Visit Diagnoses    Routine general medical examination at a health care facility    -  Primary   Pap smear for cervical cancer screening       Relevant Orders   Cytology - PAP (Completed)       [name], MD

## 2018-10-03 ENCOUNTER — Encounter: Payer: Self-pay | Admitting: Internal Medicine

## 2018-10-03 LAB — CYTOLOGY - PAP
Diagnosis: NEGATIVE
HPV: NOT DETECTED

## 2018-10-04 DIAGNOSIS — R7689 Other specified abnormal immunological findings in serum: Secondary | ICD-10-CM | POA: Insufficient documentation

## 2018-10-04 DIAGNOSIS — Z840 Family history of diseases of the skin and subcutaneous tissue: Secondary | ICD-10-CM | POA: Insufficient documentation

## 2018-10-04 DIAGNOSIS — R768 Other specified abnormal immunological findings in serum: Secondary | ICD-10-CM | POA: Insufficient documentation

## 2018-10-07 ENCOUNTER — Encounter: Payer: Self-pay | Admitting: Internal Medicine

## 2018-10-07 NOTE — Assessment & Plan Note (Signed)
On thyroid replacement.  Follow tsh.  

## 2018-10-07 NOTE — Assessment & Plan Note (Signed)
Bilateral diagnostic mammogram 07/10/18 birads II.  Recommended f/u in one year.

## 2018-10-07 NOTE — Assessment & Plan Note (Signed)
Persistent.  Eye MD evaluation.

## 2018-10-07 NOTE — Assessment & Plan Note (Signed)
Persistent head pain as outlined in previous note.  Treating allergies.  Has decreased smell and taste.  Now with episode of dizziness, etc.  Given persistence, will obtain MRI brain.  Consider neurology evaluation.

## 2018-10-07 NOTE — Assessment & Plan Note (Signed)
Controlled on current regimen.  Follow.  

## 2018-10-09 ENCOUNTER — Encounter: Payer: Self-pay | Admitting: Internal Medicine

## 2018-10-10 NOTE — Telephone Encounter (Signed)
Pt had labs through rheumatology and found to have a low B12.  Please schedule her to come in for B12 injections.  Will need to start B12 injections 1050mcg q week x 4 and then 1024mcg q month.  Thanks

## 2018-10-10 NOTE — Telephone Encounter (Signed)
Patient has been scheduled

## 2018-10-11 ENCOUNTER — Ambulatory Visit (INDEPENDENT_AMBULATORY_CARE_PROVIDER_SITE_OTHER): Payer: BC Managed Care – PPO | Admitting: *Deleted

## 2018-10-11 DIAGNOSIS — E538 Deficiency of other specified B group vitamins: Secondary | ICD-10-CM

## 2018-10-12 DIAGNOSIS — E538 Deficiency of other specified B group vitamins: Secondary | ICD-10-CM

## 2018-10-12 MED ORDER — CYANOCOBALAMIN 1000 MCG/ML IJ SOLN
1000.0000 ug | Freq: Once | INTRAMUSCULAR | Status: AC
  Administered 2018-10-12: 1000 ug via INTRAMUSCULAR

## 2018-10-12 NOTE — Progress Notes (Signed)
Patient presented for B 12 injection to left deltoid, patient voiced no concerns nor showed any signs of distress during injection. 

## 2018-10-17 ENCOUNTER — Ambulatory Visit (INDEPENDENT_AMBULATORY_CARE_PROVIDER_SITE_OTHER): Payer: BC Managed Care – PPO | Admitting: *Deleted

## 2018-10-17 DIAGNOSIS — E538 Deficiency of other specified B group vitamins: Secondary | ICD-10-CM | POA: Diagnosis not present

## 2018-10-17 MED ORDER — CYANOCOBALAMIN 1000 MCG/ML IJ SOLN
1000.0000 ug | Freq: Once | INTRAMUSCULAR | Status: AC
  Administered 2018-10-17: 1000 ug via INTRAMUSCULAR

## 2018-10-17 NOTE — Progress Notes (Signed)
Patient presented for B 12 injection to right deltoid, patient voiced no concerns nor showed any signs of distress during injection. 

## 2018-10-19 ENCOUNTER — Other Ambulatory Visit: Payer: BC Managed Care – PPO

## 2018-10-19 ENCOUNTER — Telehealth: Payer: BC Managed Care – PPO | Admitting: Family

## 2018-10-19 DIAGNOSIS — B9689 Other specified bacterial agents as the cause of diseases classified elsewhere: Secondary | ICD-10-CM

## 2018-10-19 DIAGNOSIS — J329 Chronic sinusitis, unspecified: Secondary | ICD-10-CM

## 2018-10-19 MED ORDER — AMOXICILLIN-POT CLAVULANATE 875-125 MG PO TABS: 1.0000 | ORAL_TABLET | Freq: Two times a day (BID) | ORAL | 0 refills | Status: AC

## 2018-10-19 NOTE — Progress Notes (Signed)
Thank you for the details you included in the comment boxes. Those details are very helpful in determining the best course of treatment for you and help [place] to provide the best care. We typically don't give antibiotics until 5 days but given the blood and the upcoming flight, I am sending in some now.  We are sorry that you are not feeling well.  Here is how we plan to help!  Based on what you have shared with me it looks like you have sinusitis.  Sinusitis is inflammation and infection in the sinus cavities of the head.  Based on your presentation I believe you most likely have Acute Bacterial Sinusitis.  This is an infection caused by bacteria and is treated with antibiotics. I have prescribed Augmentin 875mg /125mg  one tablet twice daily with food, for 7 days. You may use an oral decongestant such as Mucinex D or if you have glaucoma or high blood pressure use plain Mucinex. Saline nasal spray help and can safely be used as often as needed for congestion.  If you develop worsening sinus pain, fever or notice severe headache and vision changes, or if symptoms are not better after completion of antibiotic, please schedule an appointment with a health care provider.    Sinus infections are not as easily transmitted as other respiratory infection, however we still recommend that you avoid close contact with loved ones, especially the very young and elderly.  Remember to wash your hands thoroughly throughout the day as this is the number one way to prevent the spread of infection!  Home Care:  Only take medications as instructed by your medical team.  Complete the entire course of an antibiotic.  Do not take these medications with alcohol.  A steam or ultrasonic humidifier can help congestion.  You can place a towel over your head and breathe in the steam from hot water coming from a faucet.  Avoid close contacts especially the very young and the elderly.  Cover your mouth when you cough or  sneeze.  Always remember to wash your hands.  Get Help Right Away If:  You develop worsening fever or sinus pain.  You develop a severe head ache or visual changes.  Your symptoms persist after you have completed your treatment plan.  Make sure you  Understand these instructions.  Will watch your condition.  Will get help right away if you are not doing well or get worse.  Your e-visit answers were reviewed by a board certified advanced clinical practitioner to complete your personal care plan.  Depending on the condition, your plan could have included both over the counter or prescription medications.  If there is a problem please reply  once you have received a response from your provider.  Your safety is important to [place].  If you have drug allergies check your prescription carefully.    You can use MyChart to ask questions about today's visit, request a non-urgent call back, or ask for a work or school excuse for 24 hours related to this e-Visit. If it has been greater than 24 hours you will need to follow up with your provider, or enter a new e-Visit to address those concerns.  You will get an e-mail in the next two days asking about your experience.  I hope that your e-visit has been valuable and will speed your recovery. Thank you for using e-visits.

## 2018-10-26 ENCOUNTER — Ambulatory Visit (INDEPENDENT_AMBULATORY_CARE_PROVIDER_SITE_OTHER): Payer: BC Managed Care – PPO

## 2018-10-26 DIAGNOSIS — E538 Deficiency of other specified B group vitamins: Secondary | ICD-10-CM | POA: Diagnosis not present

## 2018-10-26 MED ORDER — CYANOCOBALAMIN 1000 MCG/ML IJ SOLN
1000.0000 ug | Freq: Once | INTRAMUSCULAR | Status: AC
  Administered 2018-10-26: 1000 ug via INTRAMUSCULAR

## 2018-10-26 NOTE — Progress Notes (Signed)
Pt was seen today for B-12 shot and brought in her own B-12 vial to use. Shot was given in LD pt tolerated well.   Lot #:[id number] Exp MZTA:05/8256 NDC: [place]

## 2018-10-30 ENCOUNTER — Ambulatory Visit: Payer: BC Managed Care – PPO | Admitting: Internal Medicine

## 2018-10-30 ENCOUNTER — Encounter: Payer: Self-pay | Admitting: Internal Medicine

## 2018-10-30 VITALS — BP 110/68 | HR 78 | Temp 98.0°F | Resp 18 | Wt 137.8 lb

## 2018-10-30 DIAGNOSIS — R300 Dysuria: Secondary | ICD-10-CM

## 2018-10-30 LAB — POCT URINALYSIS DIPSTICK
BILIRUBIN UA: NEGATIVE
Blood, UA: NEGATIVE
GLUCOSE UA: NEGATIVE
KETONES UA: NEGATIVE
Nitrite, UA: NEGATIVE
PH UA: 7 (ref 5.0–8.0)
Protein, UA: NEGATIVE
Spec Grav, UA: 1.015 (ref 1.010–1.025)
UROBILINOGEN UA: 0.2 U/dL

## 2018-10-30 MED ORDER — NITROFURANTOIN MONOHYD MACRO 100 MG PO CAPS: 100.0000 mg | ORAL_CAPSULE | Freq: Two times a day (BID) | ORAL | 0 refills | Status: DC

## 2018-10-30 NOTE — Telephone Encounter (Signed)
See if she can come in at 4:30 today for work in for urine symptoms.

## 2018-10-30 NOTE — Telephone Encounter (Signed)
Pt scheduled  

## 2018-10-30 NOTE — Progress Notes (Signed)
Patient ID: Angela Pacheco, female   DOB: 08-30-1966, 52 y.o.   MRN: [id number]   Subjective:    Patient ID: Angela Pacheco, female    DOB: October 24, 1966, 52 y.o.   MRN: [id number]  HPI  Patient here as a work in with concerns regarding a possible urinary tract infection.  She reports that she has noticed over the last couple of days some burning with urination, urinary urgency and pressure.  No hematuria.  No vaginal discharge or irritation.  No abdominal pain.  Some minimal nausea.  Took AZO.  No fever.  Eating and drinking.  No back pain.  Feels similar to previous infections.     Past Medical History:  Diagnosis Date  . Allergy    cats trigger  . Asthma    mild  . Thyroid disease    Past Surgical History:  Procedure Laterality Date  . ABDOMINAL HYSTERECTOMY     cervix left in place  . APPENDECTOMY  1991  . CHOLECYSTECTOMY     Family History  Problem Relation Age of Onset  . Arthritis Mother   . Breast cancer Neg Hx    Social History   Socioeconomic History  . Marital status: Married    Spouse name: Not on file  . Number of children: Not on file  . Years of education: Not on file  . Highest education level: Not on file  Occupational History  . Not on file  Social Needs  . Financial resource strain: Not on file  . Food insecurity:    Worry: Not on file    Inability: Not on file  . Transportation needs:    Medical: Not on file    Non-medical: Not on file  Tobacco Use  . Smoking status: Never Smoker  . Smokeless tobacco: Never Used  Substance and Sexual Activity  . Alcohol use: Yes    Alcohol/week: 0.0 standard drinks    Comment: occ.  . Drug use: No  . Sexual activity: Not on file  Lifestyle  . Physical activity:    Days per week: Not on file    Minutes per session: Not on file  . Stress: Not on file  Relationships  . Social connections:    Talks on phone: Not on file    Gets together: Not on file    Attends religious service: Not on file    Active member of club or organization: Not on file    Attends meetings of clubs or organizations: Not on file    Relationship status: Not on file  Other Topics Concern  . Not on file  Social History Narrative  . Not on file    Outpatient Encounter Medications as of 10/30/2018  Medication Sig  . fluticasone (FLONASE) 50 MCG/ACT nasal spray Place 2 sprays into both nostrils daily.  Marland Kitchen levothyroxine (SYNTHROID, LEVOTHROID) 88 MCG tablet One tablet daily  . loratadine (CLARITIN) 10 MG tablet Take 10 mg by mouth daily.  . nitrofurantoin, macrocrystal-monohydrate, (MACROBID) 100 MG capsule Take 1 capsule (100 mg total) by mouth 2 (two) times daily.  Marland Kitchen OVER THE COUNTER MEDICATION AMBEREN-take once a day   No facility-administered encounter medications on file as of 10/30/2018.     Review of Systems  Constitutional: Negative for fever.  Gastrointestinal: Positive for nausea. Negative for abdominal pain, diarrhea and vomiting.  Genitourinary: Positive for dysuria and urgency.  Musculoskeletal: Negative for back pain.  Psychiatric/Behavioral: Negative for agitation and dysphoric mood.  Objective:    Physical Exam  Constitutional: She appears well-developed and well-nourished. No distress.  Cardiovascular: Normal rate and regular rhythm.  Pulmonary/Chest: Breath sounds normal. No respiratory distress. She has no wheezes.  Abdominal: Soft. Bowel sounds are normal. There is no tenderness.  Musculoskeletal:  Back - non tender.  No CVA tenderness.    Psychiatric: She has a normal mood and affect. Her behavior is normal.    BP 110/68 (BP Location: Left Arm, Patient Position: Sitting, Cuff Size: Normal)   Pulse 78   Temp 98 F (36.7 C) (Oral)   Resp 18   Wt 137 lb 12.8 oz (62.5 kg)   SpO2 98%   BMI 26.04 kg/m  Wt Readings from Last 3 Encounters:  10/30/18 137 lb 12.8 oz (62.5 kg)  09/29/18 138 lb 6 oz (62.8 kg)  09/15/18 138 lb 12.8 oz (63 kg)     Lab Results  Component  Value Date   WBC 8.4 09/15/2018   HGB 14.7 09/15/2018   HCT 42.7 09/15/2018   PLT 328.0 09/15/2018   GLUCOSE 85 09/15/2018   CHOL 189 04/02/2016   TRIG 93.0 04/02/2016   HDL 53.30 04/02/2016   LDLCALC 117 (H) 04/02/2016   ALT 21 09/15/2018   AST 19 09/15/2018   NA 141 09/15/2018   K 3.9 09/15/2018   CL 106 09/15/2018   CREATININE 0.78 09/15/2018   BUN 19 09/15/2018   CO2 27 09/15/2018   TSH 3.34 09/15/2018       Assessment & Plan:   Problem List Items Addressed This Visit    None    Visit Diagnoses    Dysuria    -  Primary   Symptoms appear c/w uti.  Treat with macrobid.  Send culture.  No vaginal symptoms.  Stay hydrated.  Notify me if symptoms change or worsen.     Relevant Orders   POCT urinalysis dipstick (Completed)   Urine Microscopic (Completed)   Urine Culture (Completed)       [name], MD

## 2018-10-31 LAB — URINALYSIS, MICROSCOPIC ONLY: RBC / HPF: NONE SEEN (ref 0–?)

## 2018-11-02 ENCOUNTER — Encounter: Payer: Self-pay | Admitting: Internal Medicine

## 2018-11-02 ENCOUNTER — Ambulatory Visit (INDEPENDENT_AMBULATORY_CARE_PROVIDER_SITE_OTHER): Payer: BC Managed Care – PPO

## 2018-11-02 DIAGNOSIS — E538 Deficiency of other specified B group vitamins: Secondary | ICD-10-CM | POA: Diagnosis not present

## 2018-11-02 LAB — URINE CULTURE
MICRO NUMBER:: 91324147
SPECIMEN QUALITY:: ADEQUATE

## 2018-11-02 MED ORDER — CYANOCOBALAMIN 1000 MCG/ML IJ SOLN
1000.0000 ug | Freq: Once | INTRAMUSCULAR | Status: AC
  Administered 2018-11-03: 1000 ug via INTRAMUSCULAR

## 2018-11-02 NOTE — Progress Notes (Addendum)
Patient presented today for the last of four B12 injections. Right deltoid. Pt voiced no concerns nor showed any signs of distress during injection.   Reviewed.  Dr Nicki Reaper

## 2018-11-03 ENCOUNTER — Ambulatory Visit
Admission: RE | Admit: 2018-11-03 | Discharge: 2018-11-03 | Disposition: A | Discharge status: Home / Self Care | Payer: BC Managed Care – PPO | Source: Ambulatory Visit | Attending: Internal Medicine | Admitting: Internal Medicine

## 2018-11-03 DIAGNOSIS — E538 Deficiency of other specified B group vitamins: Secondary | ICD-10-CM | POA: Diagnosis not present

## 2018-11-03 DIAGNOSIS — R51 Headache: Secondary | ICD-10-CM | POA: Insufficient documentation

## 2018-11-03 DIAGNOSIS — R519 Headache, unspecified: Secondary | ICD-10-CM

## 2018-11-03 MED ORDER — GADOBUTROL 1 MMOL/ML IV SOLN
6.0000 mL | Freq: Once | INTRAVENOUS | Status: AC | PRN
  Administered 2018-11-03: 6 mL via INTRAVENOUS

## 2018-11-05 ENCOUNTER — Encounter: Payer: Self-pay | Admitting: Internal Medicine

## 2018-11-09 NOTE — Telephone Encounter (Signed)
I had placed an order for referral to neurology.  Can you check and see if they have scheduled her an appt.  Let me know if I need to do anything.

## 2018-11-13 NOTE — Telephone Encounter (Signed)
She is scheduled with Dr. Manuella Ghazi on Dec. 11. MT

## 2018-11-19 ENCOUNTER — Telehealth: Payer: BC Managed Care – PPO | Admitting: Family

## 2018-11-19 DIAGNOSIS — N390 Urinary tract infection, site not specified: Secondary | ICD-10-CM

## 2018-11-19 MED ORDER — CEPHALEXIN 500 MG PO CAPS: 500.0000 mg | ORAL_CAPSULE | Freq: Two times a day (BID) | ORAL | 0 refills | Status: DC

## 2018-11-19 NOTE — Progress Notes (Signed)
Thank you for the details you included in the comment boxes. Those details are very helpful in determining the best course of treatment for you and help [place] to provide the best care. It is unlikely that you would have another UTI in such a short time. If so, then perhaps it was not fully eradicated and the Macrobid did not do its full job. That being said, see plan below and if you do not improve in the next few days, be seen face to face.  We are sorry that you are not feeling well.  Here is how we plan to help!  Based on what you shared with me it looks like you most likely have a simple urinary tract infection.  A UTI (Urinary Tract Infection) is a bacterial infection of the bladder.  Most cases of urinary tract infections are simple to treat but a key part of your care is to encourage you to drink plenty of fluids and watch your symptoms carefully.  I have prescribed Keflex 500 mg twice a day for 7 days.  Your symptoms should gradually improve. Call us if the burning in your urine worsens, you develop worsening fever, back pain or pelvic pain or if your symptoms do not resolve after completing the antibiotic.  Urinary tract infections can be prevented by drinking plenty of water to keep your body hydrated.  Also be sure when you wipe, wipe from front to back and don't hold it in!  If possible, empty your bladder every 4 hours.  Your e-visit answers were reviewed by a board certified advanced clinical practitioner to complete your personal care plan.  Depending on the condition, your plan could have included both over the counter or prescription medications.  If there is a problem please reply  once you have received a response from your provider.  Your safety is important to [place].  If you have drug allergies check your prescription carefully.    You can use MyChart to ask questions about today's visit, request a non-urgent call back, or ask for a work or school excuse for 24 hours related to  this e-Visit. If it has been greater than 24 hours you will need to follow up with your provider, or enter a new e-Visit to address those concerns.   You will get an e-mail in the next two days asking about your experience.  I hope that your e-visit has been valuable and will speed your recovery. Thank you for using e-visits.

## 2018-11-21 ENCOUNTER — Encounter: Payer: Self-pay | Admitting: Internal Medicine

## 2018-11-21 NOTE — Telephone Encounter (Signed)
Pt seen you beginning of Nov. Did an e-visit end of last week.

## 2018-11-21 NOTE — Telephone Encounter (Signed)
If she is having persistent symptoms, needs to be reevaluated.  She had a round of abx at the beginning of November and just prescribed keflex 11/18/18.  Given persistent symptoms, needs to be reevaluated.

## 2018-11-21 NOTE — Telephone Encounter (Signed)
Patient aware and has been scheduled for tomorrow with Joycelyn Schmid

## 2018-11-22 ENCOUNTER — Ambulatory Visit: Payer: BC Managed Care – PPO | Admitting: Family

## 2018-12-06 ENCOUNTER — Ambulatory Visit (INDEPENDENT_AMBULATORY_CARE_PROVIDER_SITE_OTHER): Payer: BC Managed Care – PPO | Admitting: *Deleted

## 2018-12-06 DIAGNOSIS — E538 Deficiency of other specified B group vitamins: Secondary | ICD-10-CM | POA: Diagnosis not present

## 2018-12-06 MED ORDER — CYANOCOBALAMIN 1000 MCG/ML IJ SOLN
1000.0000 ug | Freq: Once | INTRAMUSCULAR | Status: AC
  Administered 2018-12-06: 1000 ug via INTRAMUSCULAR

## 2018-12-06 NOTE — Progress Notes (Signed)
Patient presented for B 12 injection to left deltoid, patient voiced no concerns nor showed any signs of distress during injection. 

## 2019-01-10 ENCOUNTER — Encounter: Payer: Self-pay | Admitting: Internal Medicine

## 2019-01-15 ENCOUNTER — Encounter: Payer: Self-pay | Admitting: Internal Medicine

## 2019-01-15 ENCOUNTER — Ambulatory Visit: Payer: BC Managed Care – PPO | Admitting: Internal Medicine

## 2019-01-15 VITALS — BP 102/68 | HR 78 | Temp 97.9°F | Resp 16 | Wt 138.0 lb

## 2019-01-15 DIAGNOSIS — J4520 Mild intermittent asthma, uncomplicated: Secondary | ICD-10-CM | POA: Diagnosis not present

## 2019-01-15 DIAGNOSIS — R51 Headache: Secondary | ICD-10-CM

## 2019-01-15 DIAGNOSIS — R221 Localized swelling, mass and lump, neck: Secondary | ICD-10-CM

## 2019-01-15 DIAGNOSIS — E039 Hypothyroidism, unspecified: Secondary | ICD-10-CM | POA: Diagnosis not present

## 2019-01-15 DIAGNOSIS — E538 Deficiency of other specified B group vitamins: Secondary | ICD-10-CM | POA: Diagnosis not present

## 2019-01-15 DIAGNOSIS — R519 Headache, unspecified: Secondary | ICD-10-CM

## 2019-01-15 LAB — CREATININE, SERUM: CREATININE: 0.81 mg/dL (ref 0.40–1.20)

## 2019-01-15 LAB — VITAMIN B12: Vitamin B-12: 461 pg/mL (ref 211–911)

## 2019-01-15 MED ORDER — CYANOCOBALAMIN 1000 MCG/ML IJ SOLN
1000.0000 ug | Freq: Once | INTRAMUSCULAR | Status: AC
  Administered 2019-01-15: 1000 ug via INTRAMUSCULAR

## 2019-01-15 NOTE — Progress Notes (Signed)
Patient ID: Angela Pacheco, female   DOB: November 12, 1966, 53 y.o.   MRN: [id number]   Subjective:    Patient ID: Angela Pacheco, female    DOB: Sep 27, 1966, 53 y.o.   MRN: [id number]  HPI  Patient here for a scheduled follow up.  She was evaluated by neurology 12/06/18.  MRI normal.  Was instructed to start topamax.  She has not.  Taking magnesium.  Also taking oral vitamin D and receiving B12 injections.  Headaches stable.  Overall feels some better.  Still some fatigue.  States two weeks after injection, started feeling more fatigued.  Tries to stay active.  Work is stressful.  Overall she feels she is handling things relatively well.  No chest pain.  No sob.  No acid reflux.  No abdominal pain.  Bowels moving.  Reports right neck fullness.  No dysphagia.  Soreness over this area.  Persistent fullness.     Past Medical History:  Diagnosis Date  . Allergy    cats trigger  . Asthma    mild  . Thyroid disease    Past Surgical History:  Procedure Laterality Date  . ABDOMINAL HYSTERECTOMY     cervix left in place  . APPENDECTOMY  1991  . CHOLECYSTECTOMY     Family History  Problem Relation Age of Onset  . Arthritis Mother   . Breast cancer Neg Hx    Social History   Socioeconomic History  . Marital status: Married    Spouse name: Not on file  . Number of children: Not on file  . Years of education: Not on file  . Highest education level: Not on file  Occupational History  . Not on file  Social Needs  . Financial resource strain: Not on file  . Food insecurity:    Worry: Not on file    Inability: Not on file  . Transportation needs:    Medical: Not on file    Non-medical: Not on file  Tobacco Use  . Smoking status: Never Smoker  . Smokeless tobacco: Never Used  Substance and Sexual Activity  . Alcohol use: Yes    Alcohol/week: 0.0 standard drinks    Comment: occ.  . Drug use: No  . Sexual activity: Not on file  Lifestyle  . Physical activity:    Days per  week: Not on file    Minutes per session: Not on file  . Stress: Not on file  Relationships  . Social connections:    Talks on phone: Not on file    Gets together: Not on file    Attends religious service: Not on file    Active member of club or organization: Not on file    Attends meetings of clubs or organizations: Not on file    Relationship status: Not on file  Other Topics Concern  . Not on file  Social History Narrative  . Not on file    Outpatient Encounter Medications as of 01/15/2019  Medication Sig  . cyanocobalamin (,VITAMIN B-12,) 1000 MCG/ML injection Inject 1,000 mcg into the muscle every 30 (thirty) days.  Marland Kitchen levothyroxine (SYNTHROID, LEVOTHROID) 88 MCG tablet One tablet daily  . [DISCONTINUED] cephALEXin (KEFLEX) 500 MG capsule Take 1 capsule (500 mg total) by mouth 2 (two) times daily.  . [DISCONTINUED] fluticasone (FLONASE) 50 MCG/ACT nasal spray Place 2 sprays into both nostrils daily.  . [DISCONTINUED] loratadine (CLARITIN) 10 MG tablet Take 10 mg by mouth daily.  . [DISCONTINUED] nitrofurantoin, macrocrystal-monohydrate, (MACROBID) 100  MG capsule Take 1 capsule (100 mg total) by mouth 2 (two) times daily.  . [DISCONTINUED] OVER THE COUNTER MEDICATION AMBEREN-take once a day  . [EXPIRED] cyanocobalamin ((VITAMIN B-12)) injection 1,000 mcg    No facility-administered encounter medications on file as of 01/15/2019.     Review of Systems  Constitutional: Negative for appetite change and unexpected weight change.  HENT: Negative for congestion and sinus pressure.        Neck fullness.   Respiratory: Negative for cough, chest tightness and shortness of breath.   Cardiovascular: Negative for chest pain, palpitations and leg swelling.  Gastrointestinal: Negative for abdominal pain, diarrhea, nausea and vomiting.  Genitourinary: Negative for difficulty urinating and dysuria.  Musculoskeletal: Negative for joint swelling and myalgias.  Skin: Negative for color change  and rash.  Neurological: Positive for headaches. Negative for dizziness and light-headedness.  Psychiatric/Behavioral: Negative for agitation and dysphoric mood.       Objective:    Physical Exam Constitutional:      General: She is not in acute distress.    Appearance: Normal appearance.  HENT:     Nose: Nose normal. No congestion.     Mouth/Throat:     Pharynx: No oropharyngeal exudate or posterior oropharyngeal erythema.  Neck:     Musculoskeletal: Neck supple.     Thyroid: No thyromegaly.     Comments: Right lateral neck fullness with minimal soreness.   Cardiovascular:     Rate and Rhythm: Normal rate and regular rhythm.  Pulmonary:     Effort: No respiratory distress.     Breath sounds: Normal breath sounds. No wheezing.  Abdominal:     General: Bowel sounds are normal.     Palpations: Abdomen is soft.     Tenderness: There is no abdominal tenderness.  Musculoskeletal:        General: No swelling or tenderness.  Lymphadenopathy:     Cervical: No cervical adenopathy.  Skin:    Findings: No erythema or rash.  Neurological:     Mental Status: She is alert.  Psychiatric:        Mood and Affect: Mood normal.        Behavior: Behavior normal.     BP 102/68 (BP Location: Left Arm, Patient Position: Sitting, Cuff Size: Normal)   Pulse 78   Temp 97.9 F (36.6 C) (Oral)   Resp 16   Wt 138 lb (62.6 kg)   SpO2 99%   BMI 26.07 kg/m  Wt Readings from Last 3 Encounters:  01/15/19 138 lb (62.6 kg)  10/30/18 137 lb 12.8 oz (62.5 kg)  09/29/18 138 lb 6 oz (62.8 kg)     Lab Results  Component Value Date   WBC 8.4 09/15/2018   HGB 14.7 09/15/2018   HCT 42.7 09/15/2018   PLT 328.0 09/15/2018   GLUCOSE 85 09/15/2018   CHOL 189 04/02/2016   TRIG 93.0 04/02/2016   HDL 53.30 04/02/2016   LDLCALC 117 (H) 04/02/2016   ALT 21 09/15/2018   AST 19 09/15/2018   NA 141 09/15/2018   K 3.9 09/15/2018   CL 106 09/15/2018   CREATININE 0.81 01/15/2019   BUN 19 09/15/2018     CO2 27 09/15/2018   TSH 3.34 09/15/2018    Mr Brain W Wo Contrast  Result Date: 11/03/2018 CLINICAL DATA:  53 year old female with several months of right facial pain and pressure, headache above the right eye, left eye twitching. No known injury. EXAM: MRI HEAD WITHOUT AND WITH  CONTRAST TECHNIQUE: Multiplanar, multiecho pulse sequences of the brain and surrounding structures were obtained without and with intravenous contrast. CONTRAST:  6 milliliters Gadavist COMPARISON:  None. FINDINGS: Brain: Cerebral volume within normal limits. No restricted diffusion to suggest acute infarction. No midline shift, mass effect, evidence of mass lesion, ventriculomegaly, extra-axial collection or acute intracranial hemorrhage. Cervicomedullary junction and pituitary are within normal limits. Pearline Cables and white matter signal is within normal limits for age throughout the brain. No abnormal enhancement identified. No dural thickening. Normal cavernous sinus. Vascular: Major intracranial vascular flow voids are preserved. The vertebrobasilar system appears diminutive on the basis of fetal type bilateral PCA origins (normal variant). The major dural venous sinuses are enhancing and appear patent. Skull and upper cervical spine: Normal visible cervical spine. Visualized bone marrow signal is within normal limits. Sinuses/Orbits: Negative orbit soft tissues. Paranasal sinuses are clear. Other: Mastoid air cells are clear. Visible internal auditory structures appear normal. Normal stylomastoid foramina. Scalp and face soft tissues appear negative. IMPRESSION: Normal for age MRI appearance of the brain. No explanation for pain or headache. Electronically Signed   By: Genevie Ann M.D.   On: 11/03/2018 15:46       Assessment & Plan:   Problem List Items Addressed This Visit    Asthma    Breathing stable.        Headache    Saw neurology.  Note reviewed.  Not taking topamax.  On magnesium.  Stable.        Hypothyroidism     On thyroid replacement.  Follow tsh.       Neck fullness    Right neck fullness.  Soreness.  Has been persistent.  Check CT neck.        Relevant Orders   CT SOFT TISSUE NECK W CONTRAST    Other Visit Diagnoses    B12 deficiency    -  Primary   Receiving B12 injections.  Recheck B12 level today.     Relevant Medications   cyanocobalamin ((VITAMIN B-12)) injection 1,000 mcg (Completed)   Other Relevant Orders   Vitamin B12 (Completed)   Creatinine (Completed)       [name], MD

## 2019-01-20 ENCOUNTER — Encounter: Payer: Self-pay | Admitting: Internal Medicine

## 2019-01-20 DIAGNOSIS — R221 Localized swelling, mass and lump, neck: Secondary | ICD-10-CM | POA: Insufficient documentation

## 2019-01-20 NOTE — Assessment & Plan Note (Signed)
On thyroid replacement.  Follow tsh.  

## 2019-01-20 NOTE — Assessment & Plan Note (Signed)
Breathing stable.

## 2019-01-20 NOTE — Assessment & Plan Note (Signed)
Saw neurology.  Note reviewed.  Not taking topamax.  On magnesium.  Stable.

## 2019-01-20 NOTE — Assessment & Plan Note (Signed)
Right neck fullness.  Soreness.  Has been persistent.  Check CT neck.

## 2019-01-29 ENCOUNTER — Encounter: Payer: Self-pay | Admitting: Internal Medicine

## 2019-01-29 ENCOUNTER — Ambulatory Visit
Admission: RE | Admit: 2019-01-29 | Discharge: 2019-01-29 | Disposition: A | Discharge status: Home / Self Care | Payer: BC Managed Care – PPO | Source: Ambulatory Visit | Attending: Internal Medicine | Admitting: Internal Medicine

## 2019-01-29 DIAGNOSIS — R221 Localized swelling, mass and lump, neck: Secondary | ICD-10-CM | POA: Insufficient documentation

## 2019-01-29 MED ORDER — IOPAMIDOL (ISOVUE-300) INJECTION 61%
75.0000 mL | Freq: Once | INTRAVENOUS | Status: AC | PRN
  Administered 2019-01-29: 75 mL via INTRAVENOUS

## 2019-02-07 ENCOUNTER — Encounter: Payer: Self-pay | Admitting: Family Medicine

## 2019-02-07 ENCOUNTER — Ambulatory Visit: Payer: BC Managed Care – PPO | Admitting: Family Medicine

## 2019-02-07 ENCOUNTER — Ambulatory Visit (INDEPENDENT_AMBULATORY_CARE_PROVIDER_SITE_OTHER): Payer: BC Managed Care – PPO

## 2019-02-07 VITALS — BP 110/80 | HR 74 | Temp 99.1°F | Wt 138.8 lb

## 2019-02-07 DIAGNOSIS — M25512 Pain in left shoulder: Secondary | ICD-10-CM

## 2019-02-07 DIAGNOSIS — M898X1 Other specified disorders of bone, shoulder: Secondary | ICD-10-CM

## 2019-02-07 DIAGNOSIS — J029 Acute pharyngitis, unspecified: Secondary | ICD-10-CM

## 2019-02-07 LAB — POCT RAPID STREP A (OFFICE): Rapid Strep A Screen: NEGATIVE

## 2019-02-07 NOTE — Assessment & Plan Note (Signed)
I suspect muscular soreness though given injury we will obtain an x-ray of her left shoulder and left clavicle.

## 2019-02-07 NOTE — Patient Instructions (Signed)
Nice to see you. We will get x-rays of your clavicle and shoulder today. Please try ibuprofen and Tylenol for discomfort.  If you are not improving over the next 5 to 7 days please contact us.  If you worsen please be reevaluated.  Please stay out of work today.

## 2019-02-07 NOTE — Assessment & Plan Note (Signed)
I suspect this represents viral pharyngitis versus viral upper respiratory infection.  Her rapid strep test was negative.  Centor criteria recommended optional rapid strep test or culture.  Discussed proceeding with culture though patient deferred this.  Discussed Tylenol or ibuprofen for symptomatic management.  She could trial Flonase to help with any congestion or postnasal drip.  She will stay out of work today.  If not improving over the next 5 days she will contact us.  Given return precautions.
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
## 2019-02-15 ENCOUNTER — Ambulatory Visit (INDEPENDENT_AMBULATORY_CARE_PROVIDER_SITE_OTHER): Payer: BC Managed Care – PPO | Admitting: *Deleted

## 2019-02-15 DIAGNOSIS — E538 Deficiency of other specified B group vitamins: Secondary | ICD-10-CM

## 2019-02-15 MED ORDER — CYANOCOBALAMIN 1000 MCG/ML IJ SOLN
1000.0000 ug | Freq: Once | INTRAMUSCULAR | Status: AC
  Administered 2019-02-15: 1000 ug via INTRAMUSCULAR

## 2019-02-15 NOTE — Progress Notes (Signed)
Patient presented for B 12 injection to left deltoid, patient voiced no concerns nor showed any signs of distress during injection. 

## 2019-03-16 ENCOUNTER — Encounter: Payer: Self-pay | Admitting: Internal Medicine

## 2019-03-16 ENCOUNTER — Telehealth: Payer: Self-pay

## 2019-03-16 NOTE — Telephone Encounter (Signed)
Copied from [place] [id number]. Topic: General - Inquiry >> Mar 16, 2019 12:15 PM Angela Pacheco wrote: Reason for CRM: Patient cancelled appointment scheduled for 3/23 due to covid19- She would like to know if it is same to come into office to just have b12 injection or if she is able to take b12 supplement until this passes. Please advise.

## 2019-03-16 NOTE — Telephone Encounter (Signed)
LMTCB

## 2019-03-16 NOTE — Telephone Encounter (Signed)
Patient returning a call to [place]. Please advise.

## 2019-03-16 NOTE — Telephone Encounter (Signed)
Pt has been rescheduled for b12. See my chart

## 2019-03-20 ENCOUNTER — Ambulatory Visit (INDEPENDENT_AMBULATORY_CARE_PROVIDER_SITE_OTHER): Payer: BC Managed Care – PPO

## 2019-03-20 ENCOUNTER — Ambulatory Visit: Payer: BC Managed Care – PPO

## 2019-03-20 ENCOUNTER — Other Ambulatory Visit: Payer: Self-pay

## 2019-03-20 DIAGNOSIS — E538 Deficiency of other specified B group vitamins: Secondary | ICD-10-CM

## 2019-03-20 MED ORDER — CYANOCOBALAMIN 1000 MCG/ML IJ SOLN
1000.0000 ug | Freq: Once | INTRAMUSCULAR | Status: AC
  Administered 2019-03-20: 1000 ug via INTRAMUSCULAR

## 2019-03-20 NOTE — Progress Notes (Addendum)
Angela Pacheco presents today for injection per MD orders. B12 injection  administered IM in right Upper Arm. Administration without incident. Patient tolerated well.   Lavender Stanke,cma  Reviewed.  Dr Nicki Reaper

## 2019-03-28 ENCOUNTER — Ambulatory Visit: Payer: Self-pay | Admitting: Internal Medicine

## 2019-03-28 ENCOUNTER — Encounter: Payer: Self-pay | Admitting: Family

## 2019-03-28 ENCOUNTER — Ambulatory Visit (INDEPENDENT_AMBULATORY_CARE_PROVIDER_SITE_OTHER): Payer: BC Managed Care – PPO | Admitting: Family

## 2019-03-28 DIAGNOSIS — J40 Bronchitis, not specified as acute or chronic: Secondary | ICD-10-CM | POA: Diagnosis not present

## 2019-03-28 MED ORDER — AZITHROMYCIN 250 MG PO TABS: ORAL_TABLET | ORAL | 0 refills | Status: DC

## 2019-03-28 MED ORDER — ALBUTEROL SULFATE (2.5 MG/3ML) 0.083% IN NEBU: 2.5000 mg | INHALATION_SOLUTION | Freq: Four times a day (QID) | RESPIRATORY_TRACT | 1 refills | Status: DC | PRN

## 2019-03-28 NOTE — Progress Notes (Signed)
This visit type was conducted due to national recommendations for restrictions regarding the COVID-19 pandemic (e.g. social distancing).  This format is felt to be most appropriate for this patient at this time.  All issues noted in this document were discussed and addressed.  No physical exam was performed (except for noted visual exam findings with Video Visits). Virtual Visit via Video Note  I connected with@ on 03/28/19 at  3:00 PM EDT by a video enabled telemedicine application and verified that I am speaking with the correct person using two identifiers.  Location patient: home Location provider:work or home office Persons participating in the virtual visit: patient, provider  I discussed the limitations of evaluation and management by telemedicine and the availability of in person appointments. The patient expressed understanding and agreed to proceed.   HPI: CC: chills, fatigue, hoareness started abruptly today,unchanged.    No fever,sore throat,  facial pain, ear pain, cough, cp, left arm pain, numbness in face, HA, vision changes.     'No trouble breathing' however more 'winded' when walking around.   States her husband works in hospital in maintenance going into 'isolation' rooms; he recently diagnosed with atypical pneumonia , started on zpak, after being seen in the emergency room. He is feeling much better.  Daughter who is at home has nebulizer machine History of asthma, non-smoker H/o seasonal allergies- on claritin.    ROS: See pertinent positives and negatives per HPI.  Past Medical History:  Diagnosis Date  . Allergy    cats trigger  . Asthma    mild  . Thyroid disease     Past Surgical History:  Procedure Laterality Date  . ABDOMINAL HYSTERECTOMY     cervix left in place  . APPENDECTOMY  1991  . CHOLECYSTECTOMY      Family History  Problem Relation Age of Onset  . Arthritis Mother   . Breast cancer Neg Hx     SOCIAL HX: nonsmoker.    Current  Outpatient Medications:  .  albuterol (PROVENTIL) (2.5 MG/3ML) 0.083% nebulizer solution, Take 3 mLs (2.5 mg total) by nebulization every 6 (six) hours as needed for wheezing or shortness of breath., Disp: 150 mL, Rfl: 1 .  azithromycin (ZITHROMAX) 250 MG tablet, Take 2 tablets ( total 500 mg) PO on day 1, then take 1 tablet ( total 250 mg) by mouth q24h x 4 days., Disp: 6 tablet, Rfl: 0 .  cyanocobalamin (,VITAMIN B-12,) 1000 MCG/ML injection, Inject 1,000 mcg into the muscle every 30 (thirty) days., Disp: , Rfl: 1 .  levothyroxine (SYNTHROID, LEVOTHROID) 88 MCG tablet, One tablet daily, Disp: 90 tablet, Rfl: 1  EXAM:  VITALS per patient if applicable:  GENERAL: alert, oriented, appears well and in no acute distress  HEENT: atraumatic, conjunttiva clear, no obvious abnormalities on inspection of external nose and ears  NECK: normal movements of the head and neck  LUNGS: on inspection no signs of respiratory distress, breathing rate appears normal, no obvious gross SOB, gasping or wheezing  CV: no obvious cyanosis  MS: moves all visible extremities without noticeable abnormality  PSYCH/NEURO: pleasant and cooperative, no obvious depression or anxiety, speech and thought processing grossly intact  ASSESSMENT AND PLAN:  Discussed the following assessment and plan:  Bronchitis - Plan: azithromycin (ZITHROMAX) 250 MG tablet, albuterol (PROVENTIL) (2.5 MG/3ML) 0.083% nebulizer solution     I discussed the assessment and treatment plan with the patient. The patient was provided an opportunity to ask questions and all were answered. The  patient agreed with the plan and demonstrated an understanding of the instructions.   The patient was advised to call back or seek an in-person evaluation if the symptoms worsen or if the condition fails to improve as anticipated.    Mable Paris, FNP

## 2019-03-28 NOTE — Telephone Encounter (Addendum)
My husband works in the hospital.   Came home with atypical pneumonia after going to the ED with his symptoms.  Today I'm feeling very fatigued.    My voice is raspy.   I used my inhaler.   I'm feeling tired and chilled. No fever.   I feel a little short of breath.  No coughing.     Since my husband is a Curator and was diagnosed with bacterial pneumonia I'm having the same symptoms.     My daughter has asthma. We are keeping my husband separated and I'm wearing a mask when I'm around him.I feel like it's in my throat.   Maybe a little sore throat it does not bother me.   My head feels stuffy so I took a decongestant.  My chest does not feel good.   It's not severe.   My husband had the same symptoms.    I'm just so tired.   He felt this way too when diagnosed.    I'm also concerned about my daughter being exposed to the bacterial pneumonia.     I verified her phone number and e mail address.   She is agreeable to a video chat or phone call visit  I sent the notes to Dr. Nicki Reaper.  I called the flow coordinator, Butch Penny in regards to the appt I made and doing the WebEx visit.   Preferably to pt needs to be seen within 4 hours.  Dr. Nicki Reaper doesn't have any openings.     Butch Penny got her an appt with Mable Paris, NP for today at 3:00.   I will put her on the schedule and call the pt and let her know.  I called the pt back and let her know that Mable Paris NP will do a video chat with her at 3:00 today.    The pt was agreeable to this              Reason for Disposition . [1] MILD difficulty breathing (e.g., minimal/no SOB at rest, SOB with walking, pulse <100) AND [2] NEW-onset or WORSE than normal  Answer Assessment - Initial Assessment Questions 1. RESPIRATORY STATUS: "Describe your breathing?" (e.g., wheezing, shortness of breath, unable to speak, severe coughing)      Today I'm having trouble with my breathing. 2. ONSET: "When did this breathing problem begin?"   Today.   My husband has bacterial pneumonia.    3. PATTERN "Does the difficult breathing come and go, or has it been constant since it started?"      *No Answer* 4. SEVERITY: "How bad is your breathing?" (e.g., mild, moderate, severe)    - MILD: No SOB at rest, mild SOB with walking, speaks normally in sentences, can lay down, no retractions, pulse < 100.    - MODERATE: SOB at rest, SOB with minimal exertion and prefers to sit, cannot lie down flat, speaks in phrases, mild retractions, audible wheezing, pulse 100-120.    - SEVERE: Very SOB at rest, speaks in single words, struggling to breathe, sitting hunched forward, retractions, pulse > 120      *No Answer* 5. RECURRENT SYMPTOM: "Have you had difficulty breathing before?" If so, ask: "When was the last time?" and "What happened that time?"      *No Answer* 6. CARDIAC HISTORY: "Do you have any history of heart disease?" (e.g., heart attack, angina, bypass surgery, angioplasty)      No 7. LUNG HISTORY: "Do you have any history of  lung disease?"  (e.g., pulmonary embolus, asthma, emphysema)     Exercise induced asthma.   Positive A & A  8. CAUSE: "What do you think is causing the breathing problem?"      Exposed to my  9. OTHER SYMPTOMS: "Do you have any other symptoms? (e.g., dizziness, runny nose, cough, chest pain, fever)     *No Answer* 10. PREGNANCY: "Is there any chance you are pregnant?" "When was your last menstrual period?"       *No Answer* 11. TRAVEL: "Have you traveled out of the country in the last month?" (e.g., travel history, exposures)       *No Answer*  Protocols used: BREATHING DIFFICULTY-A-AH

## 2019-03-28 NOTE — Patient Instructions (Signed)
As discussed today, will go ahead empirically start you on azithromycin.  Please ensure you are on probiotics while on antibiotic.  I have also sent an albuterol to be used a nebulizer machine.  Please use new tubing as we discussed.  It is also very important to use the  nebulizer away from any other members in the household ; use in a room that ONLY you would go in.    as if you do have a virus ,particularly novel COVID-19, you could transmit via the nebulizer machine as it could  be vaporized into the air.  Again this a new virus, and we do not have strong understanding as to how it is spread, so I would just advise you to use your utmost caution with where you use your nebulizer machine.  Look forward to seeing you get on Friday, hopefully  You will feeling better.  Certainly in between if you have new or worsening symptoms, please let me know

## 2019-03-28 NOTE — Telephone Encounter (Signed)
Sent to [place]

## 2019-03-28 NOTE — Assessment & Plan Note (Signed)
During video conference with patient, she did  not appear to be in respiratory distress.  She is speaking in full sentences, she is not gasping for air.  Symptom onset x1 day.  Advised patient that in current pandemic, and overall lack of COVID-19 testing, I could not be sure as to whether she may have a novel virus.  Discussed this could be allergies exacerbating her underlying asthma as most likely however advised her to stay home and essentially quarantine herself away from her family especially as her daughter has a history of asthma as well.  She would like to use her daughter's nebulizer machine, advised her that this may (even with new tubing) transmit the novel COVID-19 virus.  Patient will use new tubing and politely declines picking up a new nebulizer machine.  She  declines a chest x-ray at this time which I think is reasonable.  We will start her empirically on azithromycin with close follow-up.  I have sent in albuterol for the nebulizer machine.  Advised patient of any new or worsening symptoms certainly acute respiratory distress, to go immediately to emergency room.  She verbalized understanding of all.

## 2019-03-28 NOTE — Telephone Encounter (Signed)
Sent to [employer]

## 2019-03-29 ENCOUNTER — Ambulatory Visit: Payer: BC Managed Care – PPO | Admitting: Internal Medicine

## 2019-03-29 NOTE — Telephone Encounter (Signed)
Pt had visit yesterday with NP

## 2019-03-30 ENCOUNTER — Ambulatory Visit (INDEPENDENT_AMBULATORY_CARE_PROVIDER_SITE_OTHER): Payer: BC Managed Care – PPO | Admitting: Family

## 2019-03-30 ENCOUNTER — Encounter: Payer: Self-pay | Admitting: Family

## 2019-03-30 DIAGNOSIS — J40 Bronchitis, not specified as acute or chronic: Secondary | ICD-10-CM

## 2019-03-30 NOTE — Progress Notes (Signed)
This visit type was conducted due to national recommendations for restrictions regarding the COVID-19 pandemic (e.g. social distancing).  This format is felt to be most appropriate for this patient at this time.  All issues noted in this document were discussed and addressed.  No physical exam was performed (except for noted visual exam findings with Video Visits).  Virtual Visit via Video Note  I connected with@ on 03/30/19 at  2:00 PM EDT by a video enabled telemedicine application and verified that I am speaking with the correct person using two identifiers.  Location patient: home Location provider:work  Persons participating in the virtual visit: patient, provider  I discussed the limitations of evaluation and management by telemedicine and the availability of in person appointments. The patient expressed understanding and agreed to proceed.   HPI:  Cough, chest tightness- improved since started on zpak 2days ago.  Doesn't feel feverish or achey. No CP, SOB, sinus pain.   Has been doing albuterol inhaler and also albuterol nebulizer prn with improvement Husband has improved. Daughter was seen by Dr Aundra Dubin and started a Z-Pak today.  ROS: See pertinent positives and negatives per HPI.  Past Medical History:  Diagnosis Date  . Allergy    cats trigger  . Asthma    mild  . Thyroid disease     Past Surgical History:  Procedure Laterality Date  . ABDOMINAL HYSTERECTOMY     cervix left in place  . APPENDECTOMY  1991  . CHOLECYSTECTOMY      Family History  Problem Relation Age of Onset  . Arthritis Mother   . Breast cancer Neg Hx     SOCIAL HX: never smoker   Current Outpatient Medications:  .  albuterol (PROVENTIL) (2.5 MG/3ML) 0.083% nebulizer solution, Take 3 mLs (2.5 mg total) by nebulization every 6 (six) hours as needed for wheezing or shortness of breath., Disp: 150 mL, Rfl: 1 .  azithromycin (ZITHROMAX) 250 MG tablet, Take 2 tablets ( total 500 mg) PO on day 1,  then take 1 tablet ( total 250 mg) by mouth q24h x 4 days., Disp: 6 tablet, Rfl: 0 .  cyanocobalamin (,VITAMIN B-12,) 1000 MCG/ML injection, Inject 1,000 mcg into the muscle every 30 (thirty) days., Disp: , Rfl: 1 .  levothyroxine (SYNTHROID, LEVOTHROID) 88 MCG tablet, One tablet daily, Disp: 90 tablet, Rfl: 1  EXAM:  VITALS per patient if applicable:  GENERAL: alert, oriented, appears well and in no acute distress  HEENT: atraumatic, conjunttiva clear, no obvious abnormalities on inspection of external nose and ears  NECK: normal movements of the head and neck  LUNGS: on inspection no signs of respiratory distress, breathing rate appears normal, no obvious gross SOB, gasping or wheezing  CV: no obvious cyanosis  MS: moves all visible extremities without noticeable abnormality  PSYCH/NEURO: pleasant and cooperative, no obvious depression or anxiety, speech and thought processing grossly intact  ASSESSMENT AND PLAN:  Discussed the following assessment and plan:  Bronchitis  Problem List Items Addressed This Visit      Respiratory   Bronchitis    Pleased that patient symptomatically has improved.  As I watch patient during WebEx, no acute respiratory distress.  She is speaking full sentences, she is not gasping for air.  She will continue albuterol nebulizers, or albuterol inhaler as needed.She will continue Z-Pak and keep me abreast of any changes in her symptoms.             I discussed the assessment and treatment plan  with the patient. The patient was provided an opportunity to ask questions and all were answered. The patient agreed with the plan and demonstrated an understanding of the instructions.   The patient was advised to call back or seek an in-person evaluation if the symptoms worsen or if the condition fails to improve as anticipated.     Mable Paris, FNP

## 2019-03-30 NOTE — Assessment & Plan Note (Signed)
Pleased that patient symptomatically has improved.  As I watch patient during WebEx, no acute respiratory distress.  She is speaking full sentences, she is not gasping for air.  She will continue albuterol nebulizers, or albuterol inhaler as needed.She will continue Z-Pak and keep me abreast of any changes in her symptoms.

## 2019-04-07 ENCOUNTER — Other Ambulatory Visit: Payer: Self-pay | Admitting: Internal Medicine

## 2019-04-26 ENCOUNTER — Ambulatory Visit (INDEPENDENT_AMBULATORY_CARE_PROVIDER_SITE_OTHER): Payer: BC Managed Care – PPO

## 2019-04-26 ENCOUNTER — Ambulatory Visit: Payer: BC Managed Care – PPO | Admitting: Internal Medicine

## 2019-04-26 ENCOUNTER — Other Ambulatory Visit: Payer: Self-pay

## 2019-04-26 DIAGNOSIS — E538 Deficiency of other specified B group vitamins: Secondary | ICD-10-CM | POA: Diagnosis not present

## 2019-04-26 MED ORDER — CYANOCOBALAMIN 1000 MCG/ML IJ SOLN
1000.0000 ug | Freq: Once | INTRAMUSCULAR | Status: AC
  Administered 2019-04-26: 15:00:00 1000 ug via INTRAMUSCULAR

## 2019-04-26 NOTE — Progress Notes (Addendum)
Angela Pacheco presents today for injection per MD orders. B12 injection administered IM in left Upper Arm. Administration without incident. Patient tolerated well.  Nina,cma  Reviewed  Dr Nicki Reaper

## 2019-05-02 ENCOUNTER — Encounter: Payer: Self-pay | Admitting: Internal Medicine

## 2019-05-02 ENCOUNTER — Ambulatory Visit (INDEPENDENT_AMBULATORY_CARE_PROVIDER_SITE_OTHER): Payer: BC Managed Care – PPO | Admitting: Internal Medicine

## 2019-05-02 ENCOUNTER — Other Ambulatory Visit: Payer: Self-pay

## 2019-05-02 DIAGNOSIS — Z1211 Encounter for screening for malignant neoplasm of colon: Secondary | ICD-10-CM

## 2019-05-02 DIAGNOSIS — Z1231 Encounter for screening mammogram for malignant neoplasm of breast: Secondary | ICD-10-CM

## 2019-05-02 DIAGNOSIS — R51 Headache: Secondary | ICD-10-CM | POA: Diagnosis not present

## 2019-05-02 DIAGNOSIS — Z13220 Encounter for screening for lipoid disorders: Secondary | ICD-10-CM

## 2019-05-02 DIAGNOSIS — J4520 Mild intermittent asthma, uncomplicated: Secondary | ICD-10-CM | POA: Diagnosis not present

## 2019-05-02 DIAGNOSIS — E538 Deficiency of other specified B group vitamins: Secondary | ICD-10-CM

## 2019-05-02 DIAGNOSIS — E039 Hypothyroidism, unspecified: Secondary | ICD-10-CM | POA: Diagnosis not present

## 2019-05-02 DIAGNOSIS — R519 Headache, unspecified: Secondary | ICD-10-CM

## 2019-05-02 NOTE — Progress Notes (Signed)
Patient ID: Angela Pacheco, female   DOB: 06/13/1966, 53 y.o.   MRN: [id number]   Virtual Visit via Telephone Note  This visit type was conducted due to national recommendations for restrictions regarding the COVID-19 pandemic (e.g. social distancing).  This format is felt to be most appropriate for this patient at this time.  All issues noted in this document were discussed and addressed.  No physical exam was performed (except for noted visual exam findings with Video Visits).   I connected with Angela Pacheco by telephone and verified that I am speaking with the correct person using two identifiers. Location patient: car Location provider: work Persons participating in the telephone visit: patient, provider  I discussed the limitations, risks, security and privacy concerns of performing an evaluation and management service by telephone and the availability of in person appointments. The patient expressed understanding and agreed to proceed.   Reason for visit: scheduled follow up.   HPI: States her husband works in Sport and exercise psychologist.  Was diagnosed with bacterial pneumonia in 02/2019.  She was evaluated 03/28/19 and diagnosed with bronchitis.  Treated with zpak and albuterol.  Both are better now.  She is trying to stay in as much as possible due to COVID restrictions.  No fever.  No cough, congestion or sob.  She is working.  Some increased stress related to this - doing online, but overall doing better.  Just recently evaluated by rheumatology for positive ANA.  Taking calcium with vitamin D.  No chest pain.  No acid reflux.  No abdominal pain.  Bowels moving.  States she is taking a collagen supplement.  Reports this has helped her bowels.  Will have intermittent headaches.  Worse if sleep deprived or if increased stress.  Taking magnesium.  Headaches overall are better.   She has a history of hypothyroidism.  Recent CT revealed multiple small thyroid nodules.  She requested a referral  to endocrinology.    ROS: See pertinent positives and negatives per HPI.  Past Medical History:  Diagnosis Date  . Allergy    cats trigger  . Asthma    mild  . Thyroid disease     Past Surgical History:  Procedure Laterality Date  . ABDOMINAL HYSTERECTOMY     cervix left in place  . APPENDECTOMY  1991  . CHOLECYSTECTOMY      Family History  Problem Relation Age of Onset  . Arthritis Mother   . Breast cancer Neg Hx     SOCIAL HX: reviewed.    Current Outpatient Medications:  .  Calcium 600-200 MG-UNIT tablet, Take 1 tablet by mouth 2 (two) times daily with a meal., Disp: , Rfl:  .  cyanocobalamin (,VITAMIN B-12,) 1000 MCG/ML injection, Inject 1,000 mcg into the muscle every 30 (thirty) days., Disp: , Rfl: 1 .  levothyroxine (SYNTHROID, LEVOTHROID) 88 MCG tablet, TAKE 1 TABLET BY MOUTH DAILY, Disp: 90 tablet, Rfl: 1  EXAM:  GENERAL: alert. Answering questions appropriately.  Sounds to be in no acute distress.    PSYCH/NEURO: pleasant and cooperative, no obvious depression or anxiety, speech and thought processing grossly intact  ASSESSMENT AND PLAN:  Discussed the following assessment and plan:  Visit for screening mammogram - Plan: MM 3D SCREEN BREAST BILATERAL  Mild intermittent asthma without complication  Nonintractable headache, unspecified chronicity pattern, unspecified headache type  Hypothyroidism, unspecified type - Plan: Ambulatory referral to Endocrinology, TSH, CBC with Differential/Platelet, Comprehensive metabolic panel  L87 deficiency - Plan: Vitamin B12  Colon cancer  screening  Screening cholesterol level - Plan: Lipid panel  Asthma Breathing stable.  No sob. No cough or congestion.    Headache Saw neurology.  Taking magnesium.  Headaches - stable.  Overall doing better.   Hypothyroidism On thyroid replacement.  Follow tsh. CT revealed multiple small thyroid nodules.  Last tsh 08/2018 wnl.  Pt request referral to endocrinology.    B12  deficiency Has been receiving B12 injections.  Check B12 level with next labs.    Colon cancer screening Due colonoscopy.  Will need once COVID restrictions lifted.      I discussed the assessment and treatment plan with the patient. The patient was provided an opportunity to ask questions and all were answered. The patient agreed with the plan and demonstrated an understanding of the instructions.   The patient was advised to call back or seek an in-person evaluation if the symptoms worsen or if the condition fails to improve as anticipated.  I provided 20 minutes of non-face-to-face time during this encounter.   [name], MD

## 2019-05-06 ENCOUNTER — Encounter: Payer: Self-pay | Admitting: Internal Medicine

## 2019-05-06 DIAGNOSIS — E538 Deficiency of other specified B group vitamins: Secondary | ICD-10-CM

## 2019-05-06 DIAGNOSIS — Z1211 Encounter for screening for malignant neoplasm of colon: Secondary | ICD-10-CM | POA: Insufficient documentation

## 2019-05-06 HISTORY — DX: Deficiency of other specified B group vitamins: E53.8

## 2019-05-06 NOTE — Assessment & Plan Note (Signed)
Saw neurology.  Taking magnesium.  Headaches - stable.  Overall doing better.

## 2019-05-06 NOTE — Assessment & Plan Note (Signed)
Has been receiving B12 injections.  Check B12 level with next labs.

## 2019-05-06 NOTE — Assessment & Plan Note (Signed)
Due colonoscopy.  Will need once COVID restrictions lifted.

## 2019-05-06 NOTE — Assessment & Plan Note (Signed)
On thyroid replacement.  Follow tsh. CT revealed multiple small thyroid nodules.  Last tsh 08/2018 wnl.  Pt request referral to endocrinology.

## 2019-05-06 NOTE — Assessment & Plan Note (Signed)
Breathing stable.  No sob. No cough or congestion.

## 2019-05-15 ENCOUNTER — Encounter: Payer: Self-pay | Admitting: Internal Medicine

## 2019-05-16 ENCOUNTER — Ambulatory Visit (INDEPENDENT_AMBULATORY_CARE_PROVIDER_SITE_OTHER): Payer: BC Managed Care – PPO | Admitting: Internal Medicine

## 2019-05-16 ENCOUNTER — Encounter: Payer: Self-pay | Admitting: Internal Medicine

## 2019-05-16 ENCOUNTER — Other Ambulatory Visit: Payer: Self-pay

## 2019-05-16 DIAGNOSIS — J019 Acute sinusitis, unspecified: Secondary | ICD-10-CM

## 2019-05-16 MED ORDER — AMOXICILLIN 875 MG PO TABS: 875.0000 mg | ORAL_TABLET | Freq: Two times a day (BID) | ORAL | 0 refills | Status: DC

## 2019-05-16 NOTE — Telephone Encounter (Signed)
Pt had virtual visit today. 

## 2019-05-16 NOTE — Progress Notes (Signed)
Patient ID: Angela Pacheco, female   DOB: 20-Mar-1966, 53 y.o.   MRN: [id number]   Virtual Visit via video Note  This visit type was conducted due to national recommendations for restrictions regarding the COVID-19 pandemic (e.g. social distancing).  This format is felt to be most appropriate for this patient at this time.  All issues noted in this document were discussed and addressed.  No physical exam was performed (except for noted visual exam findings with Video Visits).   I connected with Angela Pacheco by a video enabled telemedicine application and verified that I am speaking with the correct person using two identifiers. Location patient: home Location provider: work Persons participating in the virtual visit: patient, provider  I discussed the limitations, risks, security and privacy concerns of performing an evaluation and management service by video and the availability of in person appointments. The patient expressed understanding and agreed to proceed.   Reason for visit: acute visit  HPI: She reports that symptoms started over the past week.  Increased pressure right side/maxillary sinus.  Increased nasal congestion and pressure.  No fever.  No sore throat.  Some drainage.  No chest congestion.  No sob.  No acid reflux.  Thick mucus production.  No cough.  No fever.  Eating and drinking.  No nausea or vomiting.  Bowels moving.     ROS: See pertinent positives and negatives per HPI.  Past Medical History:  Diagnosis Date   Allergy    cats trigger   Asthma    mild   Thyroid disease     Past Surgical History:  Procedure Laterality Date   ABDOMINAL HYSTERECTOMY     cervix left in place   APPENDECTOMY  1991   CHOLECYSTECTOMY      Family History  Problem Relation Age of Onset   Arthritis Mother    Breast cancer Neg Hx     SOCIAL HX: reviewed.     Current Outpatient Medications:    Calcium 600-200 MG-UNIT tablet, Take 1 tablet by mouth 2 (two)  times daily with a meal., Disp: , Rfl:    Cranberry-Vitamin C (AZO CRANBERRY URINARY TRACT PO), Take by mouth., Disp: , Rfl:    cyanocobalamin (,VITAMIN B-12,) 1000 MCG/ML injection, Inject 1,000 mcg into the muscle every 30 (thirty) days., Disp: , Rfl: 1   levothyroxine (SYNTHROID, LEVOTHROID) 88 MCG tablet, TAKE 1 TABLET BY MOUTH DAILY, Disp: 90 tablet, Rfl: 1   UNABLE TO FIND, Amberen- 1 capsule daily for menopause, Disp: , Rfl:    amoxicillin (AMOXIL) 875 MG tablet, Take 1 tablet (875 mg total) by mouth 2 (two) times daily., Disp: 20 tablet, Rfl: 0  EXAM:  GENERAL: alert, oriented, appears well and in no acute distress  HEENT: atraumatic, conjunttiva clear, no obvious abnormalities on inspection of external nose and ears  NECK: normal movements of the head and neck  LUNGS: on inspection no signs of respiratory distress, breathing rate appears normal, no obvious gross SOB, gasping or wheezing  CV: no obvious cyanosis  PSYCH/NEURO: pleasant and cooperative, no obvious depression or anxiety, speech and thought processing grossly intact  ASSESSMENT AND PLAN:  Discussed the following assessment and plan:  Acute sinusitis, recurrence not specified, unspecified location  Acute sinusitis Increased sinus pressure and congestion as outlined.  Feels similar to previous sinus infection.  Treat with mucinex, nasaocrt and saline flushes.  If symptoms do not improve or if they worsen, rx given for amoxicillin.  Follow closely.  Notify me if  symptoms worsen or do not improve.      I discussed the assessment and treatment plan with the patient. The patient was provided an opportunity to ask questions and all were answered. The patient agreed with the plan and demonstrated an understanding of the instructions.   The patient was advised to call back or seek an in-person evaluation if the symptoms worsen or if the condition fails to improve as anticipated.    [name], MD

## 2019-05-16 NOTE — Telephone Encounter (Signed)
Scheduled pt for virtual visit at 11:00 am today (05/16/19).

## 2019-05-20 ENCOUNTER — Encounter: Payer: Self-pay | Admitting: Internal Medicine

## 2019-05-20 NOTE — Assessment & Plan Note (Signed)
Increased sinus pressure and congestion as outlined.  Feels similar to previous sinus infection.  Treat with mucinex, nasaocrt and saline flushes.  If symptoms do not improve or if they worsen, rx given for amoxicillin.  Follow closely.  Notify me if symptoms worsen or do not improve.

## 2019-05-29 ENCOUNTER — Ambulatory Visit (INDEPENDENT_AMBULATORY_CARE_PROVIDER_SITE_OTHER): Payer: BC Managed Care – PPO

## 2019-05-29 ENCOUNTER — Other Ambulatory Visit: Payer: Self-pay

## 2019-05-29 DIAGNOSIS — E538 Deficiency of other specified B group vitamins: Secondary | ICD-10-CM

## 2019-05-29 MED ORDER — CYANOCOBALAMIN 1000 MCG/ML IJ SOLN
1000.0000 ug | Freq: Once | INTRAMUSCULAR | Status: AC
  Administered 2019-05-29: 1000 ug via INTRAMUSCULAR

## 2019-05-29 NOTE — Progress Notes (Addendum)
Patient presented for B 12 injection to left deltoid, patient voiced no concerns nor showed any signs of distress during injection.  Reviewed.  Dr Scott 

## 2019-06-28 ENCOUNTER — Ambulatory Visit (INDEPENDENT_AMBULATORY_CARE_PROVIDER_SITE_OTHER): Payer: BC Managed Care – PPO

## 2019-06-28 ENCOUNTER — Other Ambulatory Visit: Payer: Self-pay

## 2019-06-28 DIAGNOSIS — E538 Deficiency of other specified B group vitamins: Secondary | ICD-10-CM | POA: Diagnosis not present

## 2019-06-28 MED ORDER — CYANOCOBALAMIN 1000 MCG/ML IJ SOLN
1000.0000 ug | Freq: Once | INTRAMUSCULAR | Status: AC
  Administered 2019-06-28: 1000 ug via INTRAMUSCULAR

## 2019-06-28 NOTE — Addendum Note (Signed)
Addended by: Nash Mantis F on: 06/28/2019 04:41 PM   Modules accepted: Orders

## 2019-06-28 NOTE — Progress Notes (Addendum)
Patient presented today for B12 injection.  Administered IM in the right deltoid.  Administered without incident.  Patient tolerated well with no signs of distress.  Reviewed.  Dr Nicki Reaper

## 2019-07-06 ENCOUNTER — Encounter: Payer: Self-pay | Admitting: Internal Medicine

## 2019-07-12 ENCOUNTER — Ambulatory Visit
Admission: RE | Admit: 2019-07-12 | Discharge: 2019-07-12 | Disposition: A | Discharge status: Home / Self Care | Payer: BC Managed Care – PPO | Source: Ambulatory Visit | Attending: Internal Medicine | Admitting: Internal Medicine

## 2019-07-12 ENCOUNTER — Other Ambulatory Visit: Payer: Self-pay

## 2019-07-12 DIAGNOSIS — Z1231 Encounter for screening mammogram for malignant neoplasm of breast: Secondary | ICD-10-CM | POA: Diagnosis present

## 2019-08-01 ENCOUNTER — Ambulatory Visit (INDEPENDENT_AMBULATORY_CARE_PROVIDER_SITE_OTHER): Payer: BC Managed Care – PPO

## 2019-08-01 ENCOUNTER — Other Ambulatory Visit: Payer: Self-pay

## 2019-08-01 DIAGNOSIS — E538 Deficiency of other specified B group vitamins: Secondary | ICD-10-CM

## 2019-08-01 MED ORDER — CYANOCOBALAMIN 1000 MCG/ML IJ SOLN
1000.0000 ug | Freq: Once | INTRAMUSCULAR | Status: AC
  Administered 2019-08-01: 1000 ug via INTRAMUSCULAR

## 2019-08-01 NOTE — Progress Notes (Addendum)
Patient presented today for B12 injection.  Administered IM in left deltoid.  Patient tolerated well with no signs of distress.  Reviewed.  Dr Scott  

## 2019-08-13 ENCOUNTER — Other Ambulatory Visit: Payer: Self-pay

## 2019-08-13 ENCOUNTER — Other Ambulatory Visit (INDEPENDENT_AMBULATORY_CARE_PROVIDER_SITE_OTHER): Payer: BC Managed Care – PPO

## 2019-08-13 DIAGNOSIS — Z13220 Encounter for screening for lipoid disorders: Secondary | ICD-10-CM

## 2019-08-13 DIAGNOSIS — E039 Hypothyroidism, unspecified: Secondary | ICD-10-CM

## 2019-08-13 DIAGNOSIS — E538 Deficiency of other specified B group vitamins: Secondary | ICD-10-CM | POA: Diagnosis not present

## 2019-08-13 LAB — LIPID PANEL
Cholesterol: 197 mg/dL (ref 0–200)
HDL: 52.9 mg/dL (ref >39.00)
LDL Cholesterol: 121 mg/dL — ABNORMAL HIGH (ref 0–99)
NonHDL: 143.72
Total CHOL/HDL Ratio: 4
Triglycerides: 116 mg/dL (ref 0.0–149.0)
VLDL: 23.2 mg/dL (ref 0.0–40.0)

## 2019-08-13 LAB — CBC WITH DIFFERENTIAL/PLATELET
Basophils Absolute: 0 10*3/uL (ref 0.0–0.1)
Basophils Relative: 0.6 % (ref 0.0–3.0)
Eosinophils Absolute: 0.2 10*3/uL (ref 0.0–0.7)
Eosinophils Relative: 2.4 % (ref 0.0–5.0)
HCT: 42.5 % (ref 36.0–46.0)
Hemoglobin: 14.6 g/dL (ref 12.0–15.0)
Lymphocytes Relative: 30.4 % (ref 12.0–46.0)
Lymphs Abs: 2.3 10*3/uL (ref 0.7–4.0)
MCHC: 34.3 g/dL (ref 30.0–36.0)
MCV: 91.7 fl (ref 78.0–100.0)
Monocytes Absolute: 0.6 10*3/uL (ref 0.1–1.0)
Monocytes Relative: 8.3 % (ref 3.0–12.0)
Neutro Abs: 4.4 10*3/uL (ref 1.4–7.7)
Neutrophils Relative %: 58.3 % (ref 43.0–77.0)
Platelets: 304 10*3/uL (ref 150.0–400.0)
RBC: 4.64 Mil/uL (ref 3.87–5.11)
RDW: 13 % (ref 11.5–15.5)
WBC: 7.6 10*3/uL (ref 4.0–10.5)

## 2019-08-13 LAB — COMPREHENSIVE METABOLIC PANEL
ALT: 21 U/L (ref 0–35)
AST: 17 U/L (ref 0–37)
Albumin: 4.3 g/dL (ref 3.5–5.2)
Alkaline Phosphatase: 62 U/L (ref 39–117)
BUN: 19 mg/dL (ref 6–23)
CO2: 24 mEq/L (ref 19–32)
Calcium: 9.4 mg/dL (ref 8.4–10.5)
Chloride: 105 mEq/L (ref 96–112)
Creatinine, Ser: 0.86 mg/dL (ref 0.40–1.20)
GFR: 69.45 mL/min (ref >60.00)
Glucose, Bld: 79 mg/dL (ref 70–99)
Potassium: 4.1 mEq/L (ref 3.5–5.1)
Sodium: 140 mEq/L (ref 135–145)
Total Bilirubin: 0.5 mg/dL (ref 0.2–1.2)
Total Protein: 6.8 g/dL (ref 6.0–8.3)

## 2019-08-13 LAB — TSH: TSH: 1.94 u[IU]/mL (ref 0.35–4.50)

## 2019-08-13 LAB — VITAMIN B12: Vitamin B-12: 677 pg/mL (ref 211–911)

## 2019-08-14 ENCOUNTER — Other Ambulatory Visit: Payer: Self-pay

## 2019-08-14 ENCOUNTER — Encounter: Payer: Self-pay | Admitting: Internal Medicine

## 2019-08-14 NOTE — Telephone Encounter (Signed)
See my message.  Please call pt.  Ok to schedule telephone visit.

## 2019-08-14 NOTE — Telephone Encounter (Signed)
I am ok to schedule a doxy, but please find out more information regarding specifics.  Also make sure she is ok.

## 2019-08-14 NOTE — Telephone Encounter (Signed)
Called pt  Unable to leave message

## 2019-08-14 NOTE — Telephone Encounter (Signed)
Pt was not notified directly that she was exposed but thinks there is a good possibility that she was around someone on the team of the person they suspect tested positive. Pt scheduled virtual for tomorrow morning. She would feel better if she can speak with you directly regarding this. Confirmed pt doing ok. No acute symptoms as of now. She did mention that her allergies have been worse since school started back.

## 2019-08-15 ENCOUNTER — Encounter: Payer: Self-pay | Admitting: Internal Medicine

## 2019-08-15 ENCOUNTER — Other Ambulatory Visit: Payer: Self-pay

## 2019-08-15 ENCOUNTER — Ambulatory Visit (INDEPENDENT_AMBULATORY_CARE_PROVIDER_SITE_OTHER): Payer: BC Managed Care – PPO | Admitting: Internal Medicine

## 2019-08-15 DIAGNOSIS — J4520 Mild intermittent asthma, uncomplicated: Secondary | ICD-10-CM

## 2019-08-15 DIAGNOSIS — Z9109 Other allergy status, other than to drugs and biological substances: Secondary | ICD-10-CM

## 2019-08-15 NOTE — Progress Notes (Signed)
Patient ID: Angela Pacheco, female   DOB: 1966/03/13, 53 y.o.   MRN: [id number]   Virtual Visit via video Note  This visit type was conducted due to national recommendations for restrictions regarding the COVID-19 pandemic (e.g. social distancing).  This format is felt to be most appropriate for this patient at this time.  All issues noted in this document were discussed and addressed.  No physical exam was performed (except for noted visual exam findings with Video Visits).   I connected with Angela Pacheco by a video enabled telemedicine application or telephone and verified that I am speaking with the correct person using two identifiers. Location patient: home Location provider: work Persons participating in the virtual visit: patient, provider  I discussed the limitations, risks, security and privacy concerns of performing an evaluation and management service by virtual and the availability of in person appointments. The patient expressed understanding and agreed to proceed.   Reason for visit: work in appt.  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  States one of her coworkers was diagnosed with covid.  States she was not contacted as someone who had been in close contact with this person.  She has had no fever.  No chest congestion, cough or sob.  She has had some typical allergy symptoms - for weeks.  No new symptoms.  She has been staying in - self isolating.  Eating.  No nausea or vomiting.  No diarrhea.     ROS: See pertinent positives and negatives per HPI.  Past Medical History:  Diagnosis Date  . Allergy    cats trigger  . Asthma    mild  . Thyroid disease     Past Surgical History:  Procedure Laterality Date  . ABDOMINAL HYSTERECTOMY     cervix left in place  . APPENDECTOMY  1991  . CHOLECYSTECTOMY      Family History  Problem Relation Age of Onset  . Arthritis Mother   . Breast cancer Neg Hx      SOCIAL HX: reviewed.     Current Outpatient Medications:  .  Calcium 600-200 MG-UNIT tablet, Take 1 tablet by mouth 2 (two) times daily with a meal., Disp: , Rfl:  .  cyanocobalamin (,VITAMIN B-12,) 1000 MCG/ML injection, Inject 1,000 mcg into the muscle every 30 (thirty) days., Disp: , Rfl: 1 .  levothyroxine (SYNTHROID, LEVOTHROID) 88 MCG tablet, TAKE 1 TABLET BY MOUTH DAILY, Disp: 90 tablet, Rfl: 1  EXAM:  GENERAL: alert, oriented, appears well and in no acute distress  HEENT: atraumatic, conjunttiva clear, no obvious abnormalities on inspection of external nose and ears  NECK: normal movements of the head and neck  LUNGS: on inspection no signs of respiratory distress, breathing rate appears normal, no obvious gross SOB, gasping or wheezing  CV: no obvious cyanosis  PSYCH/NEURO: pleasant and cooperative, no obvious depression or anxiety, speech and thought processing grossly intact  ASSESSMENT AND PLAN:  Discussed the following assessment and plan:  Asthma Breathing stable.  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    Environmental allergies Doing well.  Follow.  Controlled.      I discussed the assessment and treatment plan with the patient. The patient was provided an opportunity to ask questions and all were answered. The patient agreed with the plan and demonstrated  an understanding of the instructions.   The patient was advised to call back or seek an in-person evaluation if the symptoms worsen or if the condition fails to improve as anticipated.   [name], MD

## 2019-08-16 ENCOUNTER — Encounter: Payer: Self-pay | Admitting: Internal Medicine

## 2019-08-16 NOTE — Telephone Encounter (Signed)
Pt going to drop off papers.  Please hold message until we receive.  Thanks

## 2019-08-17 NOTE — Telephone Encounter (Signed)
Form completed and placed in box.  

## 2019-08-19 ENCOUNTER — Encounter: Payer: Self-pay | Admitting: Internal Medicine

## 2019-08-19 NOTE — Assessment & Plan Note (Signed)
Doing well.  Follow.  Controlled.

## 2019-08-19 NOTE — Assessment & Plan Note (Signed)
Breathing stable.  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

## 2019-08-20 NOTE — Telephone Encounter (Signed)
Paper work was faxed and picked up

## 2019-09-05 ENCOUNTER — Other Ambulatory Visit: Payer: Self-pay

## 2019-09-05 ENCOUNTER — Ambulatory Visit (INDEPENDENT_AMBULATORY_CARE_PROVIDER_SITE_OTHER): Payer: BC Managed Care – PPO | Admitting: *Deleted

## 2019-09-05 DIAGNOSIS — E538 Deficiency of other specified B group vitamins: Secondary | ICD-10-CM

## 2019-09-05 MED ORDER — CYANOCOBALAMIN 1000 MCG/ML IJ SOLN
1000.0000 ug | Freq: Once | INTRAMUSCULAR | Status: AC
  Administered 2019-09-05: 10:00:00 1000 ug via INTRAMUSCULAR

## 2019-09-05 NOTE — Progress Notes (Addendum)
Patient presented for B 12 injection to right deltoid, patient voiced no concerns nor showed any signs of distress during injection.  Reviewed.  Dr Scott 

## 2019-09-14 ENCOUNTER — Other Ambulatory Visit: Payer: Self-pay

## 2019-09-14 ENCOUNTER — Encounter: Payer: Self-pay | Admitting: Internal Medicine

## 2019-09-14 ENCOUNTER — Ambulatory Visit (INDEPENDENT_AMBULATORY_CARE_PROVIDER_SITE_OTHER): Payer: BC Managed Care – PPO | Admitting: Internal Medicine

## 2019-09-14 VITALS — BP 100/70 | HR 76 | Temp 98.2°F | Resp 16 | Ht 61.0 in | Wt 134.4 lb

## 2019-09-14 DIAGNOSIS — J4520 Mild intermittent asthma, uncomplicated: Secondary | ICD-10-CM | POA: Diagnosis not present

## 2019-09-14 DIAGNOSIS — Z1211 Encounter for screening for malignant neoplasm of colon: Secondary | ICD-10-CM | POA: Diagnosis not present

## 2019-09-14 DIAGNOSIS — Z0000 Encounter for general adult medical examination without abnormal findings: Secondary | ICD-10-CM | POA: Diagnosis not present

## 2019-09-14 DIAGNOSIS — E039 Hypothyroidism, unspecified: Secondary | ICD-10-CM

## 2019-09-14 DIAGNOSIS — Z9109 Other allergy status, other than to drugs and biological substances: Secondary | ICD-10-CM | POA: Diagnosis not present

## 2019-09-14 NOTE — Progress Notes (Signed)
Patient ID: Angela Pacheco, female   DOB: [birth year], 53 y.o.   MRN: [id number]   Subjective:    Patient ID: Angela Pacheco, female    DOB: 04-04-1966, 53 y.o.   MRN: [id number]  HPI  Patient here for her physical exam.  She is working from home.  Some decreased stress with this.  Overall she feels she is doing relatively well.  Tries to stay active.  No chest pain. No sob. No acid reflux. No abdominal pain.  Bowels moving.  Handling stress.  Overall feels better.     Past Medical History:  Diagnosis Date  . Allergy    cats trigger  . Asthma    mild  . Thyroid disease    Past Surgical History:  Procedure Laterality Date  . ABDOMINAL HYSTERECTOMY     cervix left in place  . APPENDECTOMY  1991  . CHOLECYSTECTOMY     Family History  Problem Relation Age of Onset  . Arthritis Mother   . Breast cancer Neg Hx    Social History   Socioeconomic History  . Marital status: Married    Spouse name: Not on file  . Number of children: Not on file  . Years of education: Not on file  . Highest education level: Not on file  Occupational History  . Not on file  Social Needs  . Financial resource strain: Not on file  . Food insecurity    Worry: Not on file    Inability: Not on file  . Transportation needs    Medical: Not on file    Non-medical: Not on file  Tobacco Use  . Smoking status: Never Smoker  . Smokeless tobacco: Never Used  Substance and Sexual Activity  . Alcohol use: Yes    Alcohol/week: 0.0 standard drinks    Comment: occ.  . Drug use: No  . Sexual activity: Not on file  Lifestyle  . Physical activity    Days per week: Not on file    Minutes per session: Not on file  . Stress: Not on file  Relationships  . Social Herbalist on phone: Not on file    Gets together: Not on file    Attends religious service: Not on file    Active member of club or organization: Not on file    Attends meetings of clubs or organizations: Not on file   Relationship status: Not on file  Other Topics Concern  . Not on file  Social History Narrative  . Not on file    Outpatient Encounter Medications as of 09/14/2019  Medication Sig  . Calcium 600-200 MG-UNIT tablet Take 1 tablet by mouth 2 (two) times daily with a meal.  . cyanocobalamin (,VITAMIN B-12,) 1000 MCG/ML injection Inject 1,000 mcg into the muscle every 30 (thirty) days.  Marland Kitchen levothyroxine (SYNTHROID, LEVOTHROID) 88 MCG tablet TAKE 1 TABLET BY MOUTH DAILY   No facility-administered encounter medications on file as of 09/14/2019.     Review of Systems  Constitutional: Negative for appetite change and unexpected weight change.  HENT: Negative for congestion and sinus pressure.   Eyes: Negative for pain and visual disturbance.  Respiratory: Negative for cough, chest tightness and shortness of breath.   Cardiovascular: Negative for chest pain, palpitations and leg swelling.  Gastrointestinal: Negative for abdominal pain, diarrhea, nausea and vomiting.  Genitourinary: Negative for difficulty urinating and dysuria.  Musculoskeletal: Negative for joint swelling and myalgias.  Skin: Negative for color change  and rash.  Neurological: Negative for dizziness, light-headedness and headaches.  Hematological: Negative for adenopathy. Does not bruise/bleed easily.  Psychiatric/Behavioral: Negative for agitation and dysphoric mood.       Objective:    Physical Exam Constitutional:      General: She is not in acute distress.    Appearance: Normal appearance. She is well-developed.  HENT:     Head: Normocephalic and atraumatic.     Right Ear: External ear normal.     Left Ear: External ear normal.  Eyes:     General: No scleral icterus.       Right eye: No discharge.        Left eye: No discharge.     Conjunctiva/sclera: Conjunctivae normal.  Neck:     Musculoskeletal: Neck supple. No muscular tenderness.     Thyroid: No thyromegaly.  Cardiovascular:     Rate and Rhythm: Normal  rate and regular rhythm.  Pulmonary:     Effort: No tachypnea, accessory muscle usage or respiratory distress.     Breath sounds: Normal breath sounds. No decreased breath sounds or wheezing.  Chest:     Breasts:        Right: No inverted nipple, mass, nipple discharge or tenderness (no axillary adenopathy).        Left: No inverted nipple, mass, nipple discharge or tenderness (no axilarry adenopathy).  Abdominal:     General: Bowel sounds are normal.     Palpations: Abdomen is soft.     Tenderness: There is no abdominal tenderness.  Musculoskeletal:        General: No swelling or tenderness.  Lymphadenopathy:     Cervical: No cervical adenopathy.  Skin:    Findings: No erythema or rash.  Neurological:     Mental Status: She is alert and oriented to person, place, and time.  Psychiatric:        Mood and Affect: Mood normal.        Behavior: Behavior normal.     BP 100/70   Pulse 76   Temp 98.2 F (36.8 C)   Resp 16   Ht 5\' 1"  (1.549 m)   Wt 134 lb 6.4 oz (61 kg)   SpO2 98%   BMI 25.39 kg/m  Wt Readings from Last 3 Encounters:  09/14/19 134 lb 6.4 oz (61 kg)  02/07/19 138 lb 12.8 oz (63 kg)  01/15/19 138 lb (62.6 kg)     Lab Results  Component Value Date   WBC 7.6 08/13/2019   HGB 14.6 08/13/2019   HCT 42.5 08/13/2019   PLT 304.0 08/13/2019   GLUCOSE 79 08/13/2019   CHOL 197 08/13/2019   TRIG 116.0 08/13/2019   HDL 52.90 08/13/2019   LDLCALC 121 (H) 08/13/2019   ALT 21 08/13/2019   AST 17 08/13/2019   NA 140 08/13/2019   K 4.1 08/13/2019   CL 105 08/13/2019   CREATININE 0.86 08/13/2019   BUN 19 08/13/2019   CO2 24 08/13/2019   TSH 1.94 08/13/2019    Mm 3d Screen Breast Bilateral  Result Date: 07/12/2019 CLINICAL DATA:  Screening. EXAM: DIGITAL SCREENING BILATERAL MAMMOGRAM WITH TOMO AND CAD COMPARISON:  Previous exam(s). ACR Breast Density Category c: The breast tissue is heterogeneously dense, which may obscure small masses. FINDINGS: There are no  findings suspicious for malignancy. Images were processed with CAD. IMPRESSION: No mammographic evidence of malignancy. A result letter of this screening mammogram will be mailed directly to the patient. RECOMMENDATION: Screening mammogram in  one year. (Code:SM-B-01Y) BI-RADS CATEGORY  1: Negative. Electronically Signed   By: Kristopher Oppenheim M.D.   On: 07/12/2019 12:04       Assessment & Plan:   Problem List Items Addressed This Visit    Asthma    Breathing stable.        Colon cancer screening    Refer to GI for screening colonoscopy.       Relevant Orders   Ambulatory referral to Gastroenterology   Environmental allergies    Controlled.        Health care maintenance    Physical today 09/14/19.  PAP 09/29/18 negative with negative HPV.  Mammogram 07/12/19 - [facility] I.  Discussed colonoscopy.  She is in agreement for referral.        Hypothyroidism    On thyroid replacement.  Follow tsh.            [name], MD

## 2019-09-17 ENCOUNTER — Encounter: Payer: Self-pay | Admitting: Internal Medicine

## 2019-09-17 NOTE — Assessment & Plan Note (Signed)
Controlled.  

## 2019-09-17 NOTE — Assessment & Plan Note (Signed)
Breathing stable.

## 2019-09-17 NOTE — Assessment & Plan Note (Signed)
On thyroid replacement.  Follow tsh.  

## 2019-09-17 NOTE — Assessment & Plan Note (Signed)
Refer to GI for screening colonoscopy 

## 2019-09-17 NOTE — Assessment & Plan Note (Addendum)
Physical today 09/14/19.  PAP 09/29/18 negative with negative HPV.  Mammogram 07/12/19 - [facility] I.  Discussed colonoscopy.  She is in agreement for referral.

## 2019-09-24 ENCOUNTER — Other Ambulatory Visit: Payer: Self-pay

## 2019-09-24 ENCOUNTER — Telehealth: Payer: Self-pay | Admitting: Gastroenterology

## 2019-09-24 DIAGNOSIS — Z1211 Encounter for screening for malignant neoplasm of colon: Secondary | ICD-10-CM

## 2019-09-24 MED ORDER — NA SULFATE-K SULFATE-MG SULF 17.5-3.13-1.6 GM/177ML PO SOLN: 1.0000 | Freq: Once | ORAL | 0 refills | Status: AC

## 2019-09-24 NOTE — Telephone Encounter (Signed)
Patient is calling back  & would like to schedule a colonoscopy. 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

## 2019-09-24 NOTE — Telephone Encounter (Signed)
Gastroenterology Pre-Procedure Review  Request Date: 10/08/19 Requesting Physician: Dr. Vicente Males  PATIENT REVIEW QUESTIONS: The patient responded to the following health history questions as indicated:    1. Are you having any GI issues? Food Sensitivities, Glutten Intolerance, B12 and Vitamin D Deficiency.  Would like an appt to discuss after her colonoscopy has been performed. 2. Do you have a personal history of Polyps? no 3. Do you have a family history of Colon Cancer or Polyps? yes (Maternal Uncle had Malignant Polyp) 4. Diabetes Mellitus? no 5. Joint replacements in the past 12 months?no 6. Major health problems in the past 3 months?no 7. Any artificial heart valves, MVP, or defibrillator?no    MEDICATIONS & ALLERGIES:    Patient reports the following regarding taking any anticoagulation/antiplatelet therapy:   Plavix, Coumadin, Eliquis, Xarelto, Lovenox, Pradaxa, Brilinta, or Effient? no Aspirin? no  Patient confirms/reports the following medications:  Current Outpatient Medications  Medication Sig Dispense Refill  . Calcium 600-200 MG-UNIT tablet Take 1 tablet by mouth 2 (two) times daily with a meal.    . cyanocobalamin (,VITAMIN B-12,) 1000 MCG/ML injection Inject 1,000 mcg into the muscle every 30 (thirty) days.  1  . levothyroxine (SYNTHROID, LEVOTHROID) 88 MCG tablet TAKE 1 TABLET BY MOUTH DAILY 90 tablet 1   No current facility-administered medications for this visit.     Patient confirms/reports the following allergies:  No Known Allergies  No orders of the defined types were placed in this encounter.   AUTHORIZATION INFORMATION Primary Insurance: 1D#: Group #:  Secondary Insurance: 1D#: Group #:  SCHEDULE INFORMATION: Date: 10/08/19 Time: Location:[facility]

## 2019-10-04 ENCOUNTER — Other Ambulatory Visit
Admission: RE | Admit: 2019-10-04 | Discharge: 2019-10-04 | Disposition: A | Discharge status: Home / Self Care | Payer: BC Managed Care – PPO | Source: Ambulatory Visit | Attending: Gastroenterology | Admitting: Gastroenterology

## 2019-10-04 ENCOUNTER — Other Ambulatory Visit: Payer: Self-pay

## 2019-10-04 DIAGNOSIS — Z01812 Encounter for preprocedural laboratory examination: Secondary | ICD-10-CM | POA: Diagnosis present

## 2019-10-04 DIAGNOSIS — Z20828 Contact with and (suspected) exposure to other viral communicable diseases: Secondary | ICD-10-CM | POA: Diagnosis not present

## 2019-10-04 LAB — SARS CORONAVIRUS 2 (TAT 6-24 HRS): SARS Coronavirus 2: NEGATIVE

## 2019-10-05 ENCOUNTER — Encounter: Payer: Self-pay | Admitting: *Deleted

## 2019-10-07 ENCOUNTER — Encounter: Payer: Self-pay | Admitting: Anesthesiology

## 2019-10-08 ENCOUNTER — Encounter
Admission: RE | Disposition: A | Discharge status: Home / Self Care | Payer: Self-pay | Source: Home / Self Care | Attending: Gastroenterology

## 2019-10-08 ENCOUNTER — Other Ambulatory Visit: Payer: Self-pay

## 2019-10-08 ENCOUNTER — Ambulatory Visit: Payer: BC Managed Care – PPO | Admitting: Anesthesiology

## 2019-10-08 ENCOUNTER — Ambulatory Visit: Payer: BC Managed Care – PPO

## 2019-10-08 ENCOUNTER — Encounter: Payer: Self-pay | Admitting: *Deleted

## 2019-10-08 ENCOUNTER — Ambulatory Visit
Admission: RE | Admit: 2019-10-08 | Discharge: 2019-10-08 | Disposition: A | Discharge status: Home / Self Care | Payer: BC Managed Care – PPO | Attending: Gastroenterology | Admitting: Gastroenterology

## 2019-10-08 DIAGNOSIS — J45909 Unspecified asthma, uncomplicated: Secondary | ICD-10-CM | POA: Insufficient documentation

## 2019-10-08 DIAGNOSIS — K635 Polyp of colon: Secondary | ICD-10-CM | POA: Diagnosis not present

## 2019-10-08 DIAGNOSIS — E039 Hypothyroidism, unspecified: Secondary | ICD-10-CM | POA: Insufficient documentation

## 2019-10-08 DIAGNOSIS — D120 Benign neoplasm of cecum: Secondary | ICD-10-CM | POA: Insufficient documentation

## 2019-10-08 DIAGNOSIS — K644 Residual hemorrhoidal skin tags: Secondary | ICD-10-CM | POA: Diagnosis not present

## 2019-10-08 DIAGNOSIS — Z1211 Encounter for screening for malignant neoplasm of colon: Secondary | ICD-10-CM | POA: Insufficient documentation

## 2019-10-08 DIAGNOSIS — Z90710 Acquired absence of both cervix and uterus: Secondary | ICD-10-CM | POA: Insufficient documentation

## 2019-10-08 DIAGNOSIS — Z8261 Family history of arthritis: Secondary | ICD-10-CM | POA: Insufficient documentation

## 2019-10-08 DIAGNOSIS — Z9049 Acquired absence of other specified parts of digestive tract: Secondary | ICD-10-CM | POA: Diagnosis not present

## 2019-10-08 HISTORY — DX: Other complications of anesthesia, initial encounter: T88.59XA

## 2019-10-08 HISTORY — DX: Hypothyroidism, unspecified: E03.9

## 2019-10-08 HISTORY — PX: COLONOSCOPY WITH PROPOFOL: SHX5780

## 2019-10-08 SURGERY — COLONOSCOPY WITH PROPOFOL
Anesthesia: General

## 2019-10-08 MED ORDER — PROPOFOL 10 MG/ML IV BOLUS
INTRAVENOUS | Status: DC | PRN
  Administered 2019-10-08: 20 mg via INTRAVENOUS
  Administered 2019-10-08: 30 mg via INTRAVENOUS
  Administered 2019-10-08 (×2): 50 mg via INTRAVENOUS
  Administered 2019-10-08: 20 mg via INTRAVENOUS

## 2019-10-08 MED ORDER — FENTANYL CITRATE (PF) 100 MCG/2ML IJ SOLN
INTRAMUSCULAR | Status: DC | PRN
  Administered 2019-10-08: 50 ug via INTRAVENOUS

## 2019-10-08 MED ORDER — SODIUM CHLORIDE 0.9 % IV SOLN
INTRAVENOUS | Status: DC
  Administered 2019-10-08: 10:00:00 via INTRAVENOUS
  Administered 2019-10-08: 1000 mL via INTRAVENOUS

## 2019-10-08 MED ORDER — MIDAZOLAM HCL 2 MG/2ML IJ SOLN
INTRAMUSCULAR | Status: DC | PRN
  Administered 2019-10-08: 1 mg via INTRAVENOUS

## 2019-10-08 MED ORDER — PROPOFOL 500 MG/50ML IV EMUL
INTRAVENOUS | Status: DC | PRN
  Administered 2019-10-08: 150 ug/kg/min via INTRAVENOUS

## 2019-10-08 MED ORDER — FENTANYL CITRATE (PF) 100 MCG/2ML IJ SOLN
INTRAMUSCULAR | Status: AC
  Filled 2019-10-08: qty 2

## 2019-10-08 MED ORDER — MIDAZOLAM HCL 2 MG/2ML IJ SOLN
INTRAMUSCULAR | Status: AC
  Filled 2019-10-08: qty 2

## 2019-10-08 NOTE — Op Note (Signed)
[facility] Gastroenterology Patient Name: Angela Pacheco Procedure Date: 10/08/2019 9:28 AM MRN: [id number] Account #: [id number] Date of Birth: [birth year] Admit Type: Outpatient Age: 53 Room: [facility] ENDO ROOM 3 Gender: Female Note Status: Finalized Procedure:            Colonoscopy Indications:          Screening for colorectal malignant neoplasm, This is                        the patient's first colonoscopy Providers:            Lin Landsman MD, MD Referring MD:         [name], MD (Referring MD) Medicines:            Monitored Anesthesia Care Complications:        No immediate complications. Estimated blood loss: None. Procedure:            Pre-Anesthesia Assessment:                       - Prior to the procedure, a History and Physical was                        performed, and patient medications and allergies were                        reviewed. The patient is competent. The risks and                        benefits of the procedure and the sedation options and                        risks were discussed with the patient. All questions                        were answered and informed consent was obtained.                        Patient identification and proposed procedure were                        verified by the physician, the nurse, the                        anesthesiologist, the anesthetist and the technician in                        the pre-procedure area in the procedure room in the                        endoscopy suite. Mental Status Examination: alert and                        oriented. Airway Examination: normal oropharyngeal                        airway and neck mobility. Respiratory Examination:                        clear to auscultation. CV Examination: normal.  Prophylactic Antibiotics: The patient does not require                        prophylactic antibiotics. Prior Anticoagulants: The                patient has taken no previous anticoagulant or                        antiplatelet agents. ASA Grade Assessment: III - A                        patient with severe systemic disease. After reviewing                        the risks and benefits, the patient was deemed in                        satisfactory condition to undergo the procedure. The                        anesthesia plan was to use monitored anesthesia care                        (MAC). Immediately prior to administration of                        medications, the patient was re-assessed for adequacy                        to receive sedatives. The heart rate, respiratory rate,                        oxygen saturations, blood pressure, adequacy of                        pulmonary ventilation, and response to care were                        monitored throughout the procedure. The physical status                        of the patient was re-assessed after the procedure.                       After obtaining informed consent, the colonoscope was                        passed under direct vision. Throughout the procedure,                        the patient's blood pressure, pulse, and oxygen                        saturations were monitored continuously. The                        Colonoscope was introduced through the anus and                        advanced to the the cecum, identified by appendiceal  orifice and ileocecal valve. The colonoscopy was                        performed without difficulty. The patient tolerated the                        procedure well. Findings:      Skin tags were found on perianal exam.      A 5 mm polyp was found in the cecum. The polyp was sessile. The polyp       was removed with a cold snare. Resection and retrieval were complete.      The retroflexed view of the distal rectum and anal verge was normal and       showed no anal or rectal abnormalities.       The exam was otherwise without abnormality. Impression:           - Perianal skin tags found on perianal exam.                       - One 5 mm polyp in the cecum, removed with a cold                        snare. Resected and retrieved.                       - The distal rectum and anal verge are normal on                        retroflexion view.                       - The examination was otherwise normal. Recommendation:       - Discharge patient to home (with escort).                       - Resume previous diet today.                       - Continue present medications.                       - Await pathology results.                       - Repeat colonoscopy in 7 years for surveillance. Procedure Code(s):    --- Professional ---                       [phone], Colonoscopy, flexible; with removal of tumor(s),                        polyp(s), or other lesion(s) by snare technique Diagnosis Code(s):    --- Professional ---                       Z12.11, Encounter for screening for malignant neoplasm                        of colon                       K63.5, Polyp of colon  K64.4, Residual hemorrhoidal skin tags CPT copyright 2019 American Medical Association. All rights reserved. The codes documented in this report are preliminary and upon coder review may  be revised to meet current compliance requirements. Dr. Ulyess Mort Lin Landsman MD, MD 10/08/2019 9:56:27 AM This report has been signed electronically. Number of Addenda: 0 Note Initiated On: 10/08/2019 9:28 AM Scope Withdrawal Time: 0 hours 9 minutes 19 seconds  Total Procedure Duration: 0 hours 12 minutes 17 seconds  Estimated Blood Loss: Estimated blood loss: none.      [facility]

## 2019-10-08 NOTE — Anesthesia Postprocedure Evaluation (Signed)
Anesthesia Post Note  Patient: Angela Pacheco  Procedure(s) Performed: COLONOSCOPY WITH PROPOFOL (N/A )  Patient location during evaluation: Endoscopy Anesthesia Type: General Level of consciousness: awake and alert Pain management: pain level controlled Vital Signs Assessment: post-procedure vital signs reviewed and stable Respiratory status: spontaneous breathing, nonlabored ventilation, respiratory function stable and patient connected to nasal cannula oxygen Cardiovascular status: blood pressure returned to baseline and stable Postop Assessment: no apparent nausea or vomiting Anesthetic complications: no     Last Vitals:  Vitals:   10/08/19 1020 10/08/19 1030  BP: 105/78 99/80  Pulse: 73 73  Resp: 12 12  Temp:    SpO2: 99% 99%    Last Pain:  Vitals:   10/08/19 0908  TempSrc: Tympanic  PainSc: 0-No pain                 [name] S

## 2019-10-08 NOTE — Anesthesia Preprocedure Evaluation (Signed)
Anesthesia Evaluation  Patient identified by MRN, date of birth, ID band Patient awake    Reviewed: Allergy & Precautions, NPO status , Patient's Chart, lab work & pertinent test results, reviewed documented beta blocker date and time   Airway Mallampati: II  TM Distance: >3 FB     Dental  (+) Chipped   Pulmonary asthma ,           Cardiovascular      Neuro/Psych  Headaches,    GI/Hepatic   Endo/Other  Hypothyroidism   Renal/GU      Musculoskeletal   Abdominal   Peds  Hematology   Anesthesia Other Findings   Reproductive/Obstetrics                             Anesthesia Physical Anesthesia Plan  ASA: III  Anesthesia Plan: General   Post-op Pain Management:    Induction: Intravenous  PONV Risk Score and Plan:   Airway Management Planned:   Additional Equipment:   Intra-op Plan:   Post-operative Plan:   Informed Consent: I have reviewed the patients History and Physical, chart, labs and discussed the procedure including the risks, benefits and alternatives for the proposed anesthesia with the patient or authorized representative who has indicated his/her understanding and acceptance.       Plan Discussed with: CRNA  Anesthesia Plan Comments:         Anesthesia Quick Evaluation

## 2019-10-08 NOTE — Anesthesia Post-op Follow-up Note (Signed)
Anesthesia QCDR form completed.        

## 2019-10-08 NOTE — Transfer of Care (Signed)
Immediate Anesthesia Transfer of Care Note  Patient: Angela Pacheco  Procedure(s) Performed: COLONOSCOPY WITH PROPOFOL (N/A )  Patient Location: PACU  Anesthesia Type:General  Level of Consciousness: sedated  Airway & Oxygen Therapy: Patient Spontanous Breathing and Patient connected to nasal cannula oxygen  Post-op Assessment: Report given to RN and Post -op Vital signs reviewed and stable  Post vital signs: Reviewed and stable  Last Vitals:  Vitals Value Taken Time  BP 102/74 10/08/19 1000  Temp    Pulse 72 10/08/19 1001  Resp 9 10/08/19 1001  SpO2 96 % 10/08/19 1001  Vitals shown include unvalidated device data.  Last Pain:  Vitals:   10/08/19 0908  TempSrc: Tympanic  PainSc: 0-No pain         Complications: No apparent anesthesia complications

## 2019-10-08 NOTE — H&P (Signed)
Angela Darby, MD [address]  [place]  [place], [place] [address]  Main: [phone]  Fax: [phone] Pager: [phone]  Primary Care Physician:  [name], MD Primary Gastroenterologist:  Dr. Cephas Pacheco  Pre-Procedure History & Physical: HPI:  Angela Pacheco is a 53 y.o. female is here for an colonoscopy.   Past Medical History:  Diagnosis Date  . Allergy    cats trigger  . Asthma    mild  . Complication of anesthesia   . Hypothyroidism   . Thyroid disease     Past Surgical History:  Procedure Laterality Date  . ABDOMINAL HYSTERECTOMY     cervix left in place  . APPENDECTOMY  1991  . CHOLECYSTECTOMY      Prior to Admission medications   Medication Sig Start Date End Date Taking? Authorizing Provider  Calcium 600-200 MG-UNIT tablet Take 1 tablet by mouth 2 (two) times daily with a meal. 01/12/19 01/12/20  [provider]  cyanocobalamin (,VITAMIN B-12,) 1000 MCG/ML injection Inject 1,000 mcg into the muscle every 30 (thirty) days. 10/09/18   [provider]  levothyroxine (SYNTHROID, LEVOTHROID) 88 MCG tablet TAKE 1 TABLET BY MOUTH DAILY 04/09/19   [name], MD    Allergies as of 09/24/2019  . (No Known Allergies)    Family History  Problem Relation Age of Onset  . Arthritis Mother   . Breast cancer Neg Hx     Social History   Socioeconomic History  . Marital status: Married    Spouse name: Not on file  . Number of children: Not on file  . Years of education: Not on file  . Highest education level: Not on file  Occupational History  . Not on file  Social Needs  . Financial resource strain: Not on file  . Food insecurity    Worry: Not on file    Inability: Not on file  . Transportation needs    Medical: Not on file    Non-medical: Not on file  Tobacco Use  . Smoking status: Never Smoker  . Smokeless tobacco: Never Used  Substance and Sexual Activity  . Alcohol use: Yes    Alcohol/week: 0.0  standard drinks    Comment: occ.  . Drug use: No  . Sexual activity: Not on file  Lifestyle  . Physical activity    Days per week: Not on file    Minutes per session: Not on file  . Stress: Not on file  Relationships  . Social Herbalist on phone: Not on file    Gets together: Not on file    Attends religious service: Not on file    Active member of club or organization: Not on file    Attends meetings of clubs or organizations: Not on file    Relationship status: Not on file  . Intimate partner violence    Fear of current or ex partner: Not on file    Emotionally abused: Not on file    Physically abused: Not on file    Forced sexual activity: Not on file  Other Topics Concern  . Not on file  Social History Narrative  . Not on file    Review of Systems: See HPI, otherwise negative ROS  Physical Exam: BP 99/80   Pulse 73   Temp (!) 96.4 F (35.8 C) (Tympanic)   Resp 12   Ht 5\' 1"  (1.549 m)   Wt 129 lb (58.5 kg)  SpO2 99%   BMI 24.37 kg/m  General:   Alert,  pleasant and cooperative in NAD Head:  Normocephalic and atraumatic. Neck:  Supple; no masses or thyromegaly. Lungs:  Clear throughout to auscultation.    Heart:  Regular rate and rhythm. Abdomen:  Soft, nontender and nondistended. Normal bowel sounds, without guarding, and without rebound.   Neurologic:  Alert and  oriented x4;  grossly normal neurologically.  Impression/Plan: Angela Pacheco is here for an colonoscopy to be performed for colon cancer screening  Risks, benefits, limitations, and alternatives regarding  colonoscopy have been reviewed with the patient.  Questions have been answered.  All parties agreeable.   Sherri Sear, MD  10/08/2019, 11:36 AM

## 2019-10-09 ENCOUNTER — Other Ambulatory Visit: Payer: Self-pay

## 2019-10-09 ENCOUNTER — Encounter: Payer: Self-pay | Admitting: Gastroenterology

## 2019-10-09 ENCOUNTER — Ambulatory Visit (INDEPENDENT_AMBULATORY_CARE_PROVIDER_SITE_OTHER): Payer: BC Managed Care – PPO

## 2019-10-09 DIAGNOSIS — E538 Deficiency of other specified B group vitamins: Secondary | ICD-10-CM

## 2019-10-09 LAB — SURGICAL PATHOLOGY

## 2019-10-09 MED ORDER — CYANOCOBALAMIN 1000 MCG/ML IJ SOLN
1000.0000 ug | Freq: Once | INTRAMUSCULAR | Status: AC
  Administered 2019-10-09: 1000 ug via INTRAMUSCULAR

## 2019-10-09 NOTE — Progress Notes (Addendum)
Patient presented today for B12 injection.  Administered IM in the left deltoid.  Patient tolerated well with no signs of distress.  Reviewed.  Dr Scott  

## 2019-10-10 ENCOUNTER — Encounter: Payer: Self-pay | Admitting: Internal Medicine

## 2019-10-11 ENCOUNTER — Other Ambulatory Visit: Payer: Self-pay

## 2019-10-11 MED ORDER — LEVOTHYROXINE SODIUM 88 MCG PO TABS: ORAL_TABLET | ORAL | 1 refills | Status: DC

## 2019-11-01 ENCOUNTER — Encounter: Payer: Self-pay | Admitting: Internal Medicine

## 2019-11-02 ENCOUNTER — Telehealth: Payer: BC Managed Care – PPO | Admitting: Nurse Practitioner

## 2019-11-02 DIAGNOSIS — J019 Acute sinusitis, unspecified: Secondary | ICD-10-CM | POA: Diagnosis not present

## 2019-11-02 DIAGNOSIS — B9689 Other specified bacterial agents as the cause of diseases classified elsewhere: Secondary | ICD-10-CM | POA: Diagnosis not present

## 2019-11-02 MED ORDER — AMOXICILLIN-POT CLAVULANATE 875-125 MG PO TABS: 1.0000 | ORAL_TABLET | Freq: Two times a day (BID) | ORAL | 0 refills | Status: AC

## 2019-11-02 MED ORDER — FLUTICASONE PROPIONATE 50 MCG/ACT NA SUSP: 2.0000 | Freq: Every day | NASAL | 0 refills | Status: DC

## 2019-11-02 NOTE — Progress Notes (Signed)
We are sorry that you are not feeling well.  Here is how we plan to help!  Based on what you have shared with me it looks like you have sinusitis.  Sinusitis is inflammation and infection in the sinus cavities of the head.  Based on your presentation I believe you most likely have Acute Bacterial Sinusitis.  This is an infection caused by bacteria and is treated with antibiotics. I have prescribed Augmentin 875mg /125mg  one tablet twice daily with food, for 7 days. I am also prescribing Fluticasone (Flonase) nasal spray, two sprays in each nostril daily until symptoms improve. You may use an oral decongestant such as Mucinex D or if you have glaucoma or high blood pressure use plain Mucinex. Saline nasal spray help and can safely be used as often as needed for congestion.  If you develop worsening sinus pain, fever or notice severe headache and vision changes, or if symptoms are not better after completion of antibiotic, please schedule an appointment with a health care provider.    Sinus infections are not as easily transmitted as other respiratory infection, however we still recommend that you avoid close contact with loved ones, especially the very young and elderly.  Remember to wash your hands thoroughly throughout the day as this is the number one way to prevent the spread of infection!  Home Care:  Only take medications as instructed by your medical team.  Complete the entire course of an antibiotic.  Do not take these medications with alcohol.  A steam or ultrasonic humidifier can help congestion.  You can place a towel over your head and breathe in the steam from hot water coming from a faucet.  Avoid close contacts especially the very young and the elderly.  Cover your mouth when you cough or sneeze.  Always remember to wash your hands.  Get Help Right Away If:  You develop worsening fever or sinus pain.  You develop a severe head ache or visual changes.  Your symptoms persist  after you have completed your treatment plan.  Make sure you  Understand these instructions.  Will watch your condition.  Will get help right away if you are not doing well or get worse.  Your e-visit answers were reviewed by a board certified advanced clinical practitioner to complete your personal care plan.  Depending on the condition, your plan could have included both over the counter or prescription medications.  If there is a problem please reply  once you have received a response from your provider.  Your safety is important to [place].  If you have drug allergies check your prescription carefully.    You can use MyChart to ask questions about today's visit, request a non-urgent call back, or ask for a work or school excuse for 24 hours related to this e-Visit. If it has been greater than 24 hours you will need to follow up with your provider, or enter a new e-Visit to address those concerns.  You will get an e-mail in the next two days asking about your experience.  I hope that your e-visit has been valuable and will speed your recovery. Thank you for using e-visits.  I have spent at least 5 minutes reviewing and documenting in the patient's chart.

## 2019-11-13 ENCOUNTER — Ambulatory Visit (INDEPENDENT_AMBULATORY_CARE_PROVIDER_SITE_OTHER): Payer: BC Managed Care – PPO

## 2019-11-13 ENCOUNTER — Other Ambulatory Visit: Payer: Self-pay

## 2019-11-13 DIAGNOSIS — E538 Deficiency of other specified B group vitamins: Secondary | ICD-10-CM | POA: Diagnosis not present

## 2019-11-13 MED ORDER — CYANOCOBALAMIN 1000 MCG/ML IJ SOLN
1000.0000 ug | Freq: Once | INTRAMUSCULAR | Status: AC
  Administered 2019-11-13: 16:00:00 1000 ug via INTRAMUSCULAR

## 2019-11-13 NOTE — Progress Notes (Addendum)
Angela Pacheco presents today for injection per MD orders. B12 injection  administered SQ in left Upper Arm. Administration without incident. Patient tolerated well. Shanelle Clontz,cma   Reviewed.  Dr Nicki Reaper

## 2019-11-27 ENCOUNTER — Other Ambulatory Visit: Payer: Self-pay

## 2019-11-28 ENCOUNTER — Ambulatory Visit (INDEPENDENT_AMBULATORY_CARE_PROVIDER_SITE_OTHER): Payer: BC Managed Care – PPO | Admitting: Gastroenterology

## 2019-11-28 ENCOUNTER — Encounter: Payer: Self-pay | Admitting: Gastroenterology

## 2019-11-28 ENCOUNTER — Other Ambulatory Visit: Payer: Self-pay

## 2019-11-28 VITALS — BP 108/73 | HR 87 | Temp 98.0°F | Wt 133.5 lb

## 2019-11-28 DIAGNOSIS — K9041 Non-celiac gluten sensitivity: Secondary | ICD-10-CM | POA: Diagnosis not present

## 2019-11-28 NOTE — Progress Notes (Signed)
Cephas Darby, MD [address]  [place]  [place], [place] [address]  Main: [phone]  Fax: [phone]    Gastroenterology Consultation  Referring Provider:     [name], MD Primary Care Physician:  [name], MD Primary Gastroenterologist:  Dr. Cephas Darby Reason for Consultation:     To evaluate for celiac disease        HPI:   Angela Pacheco is a 53 y.o. female referred by Dr. [name], MD  for consultation & management of chronic history of gastrointestinal symptoms including abdominal pain, bloating and postprandial diarrhea, heartburn whenever she consumes gluten, wheat, dairy and soy.  She reports symptoms are worse every time she has gluten exposure which last for 2 days associated with arthralgias in her large joints.  She is currently taking digestive enzymes and probiotics.  She has history of Hashimoto's thyroiditis as well as positive ANA.  Strong family history of autoimmune conditions in her first-degree relatives.  As long as she avoids exposure to gluten, dairy and soy, she does not experience any GI symptoms.  She finds it challenging especially good eating out.  She denies any stressful events in her life.  Her weight has been stable.  No evidence of anemia.Her serum B12 was low at 272 on 10/04/2018, has been on monthly vitamin B12 since then.  She had positive ANA in the past, had work-up which was negative for underlying rheumatologic condition She is interested to know if she has celiac disease or any other underlying food intolerances She was seen by allergy and immunology several years ago.  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 - One 5 mm polyp in the cecum, removed with a cold snare. Resected and retrieved. - The distal rectum and anal verge are normal on retroflexion view. - The examination was otherwise normal.  DIAGNOSIS:  A. COLON POLYP, CECUM; COLD SNARE:  - TUBULAR ADENOMA.  - NEGATIVE FOR HIGH-GRADE DYSPLASIA AND MALIGNANCY.   Past Medical History:  Diagnosis Date  . Allergy    cats trigger  . Asthma    mild  . Complication of anesthesia   . Hypothyroidism   . Thyroid disease     Past Surgical History:  Procedure Laterality Date  . ABDOMINAL HYSTERECTOMY     cervix left in place  . APPENDECTOMY  1991  . CHOLECYSTECTOMY    . COLONOSCOPY WITH PROPOFOL N/A 10/08/2019   Procedure: COLONOSCOPY WITH PROPOFOL;  Surgeon: Lin Landsman, MD;  Location: [facility] ENDOSCOPY;  Service: Gastroenterology;  Laterality: N/A;    Current Outpatient Medications:  .  Calcium 600-200 MG-UNIT tablet, Take 1 tablet by mouth 2 (two) times daily with a meal., Disp: , Rfl:  .  cyanocobalamin (,VITAMIN B-12,) 1000 MCG/ML injection, Inject 1,000 mcg into the muscle every 30 (thirty) days., Disp: , Rfl: 1 .  fluticasone (FLONASE) 50 MCG/ACT nasal spray, Place 2 sprays into both nostrils daily for 14 days., Disp: 16 g, Rfl: 0 .  levothyroxine (SYNTHROID) 88 MCG tablet, TAKE 1 TABLET BY MOUTH DAILY, Disp: 90 tablet, Rfl: 1    Family History  Problem Relation Age of Onset  . Arthritis Mother   . Breast cancer Neg Hx  Social History   Tobacco Use  . Smoking status: Never Smoker  . Smokeless tobacco: Never Used  Substance Use Topics  . Alcohol use: Yes    Alcohol/week: 0.0 standard drinks    Comment: occ.  . Drug use: No    Allergies as of 11/28/2019  . (No Known Allergies)    Review of Systems:    All systems reviewed and negative except where noted in HPI.   Physical Exam:  BP 108/73 (BP Location: Left Arm, Patient Position: Sitting, Cuff Size: Normal)   Pulse 87    Temp 98 F (36.7 C) (Oral)   Wt 133 lb 8 oz (60.6 kg)   BMI 25.22 kg/m  No LMP recorded. Patient has had a hysterectomy.  General:   Alert,  Well-developed, well-nourished, pleasant and cooperative in NAD Head:  Normocephalic and atraumatic. Eyes:  Sclera clear, no icterus.   Conjunctiva pink. Ears:  Normal auditory acuity. Nose:  No deformity, discharge, or lesions. Mouth:  No deformity or lesions,oropharynx pink & moist. Neck:  Supple; no masses or thyromegaly. Lungs:  Respirations even and unlabored.  Clear throughout to auscultation.   No wheezes, crackles, or rhonchi. No acute distress. Heart:  Regular rate and rhythm; no murmurs, clicks, rubs, or gallops. Abdomen:  Normal bowel sounds. Soft, non-tender and non-distended without masses, hepatosplenomegaly or hernias noted.  No guarding or rebound tenderness.   Rectal: Not performed Msk:  Symmetrical without gross deformities. Good, equal movement & strength bilaterally. Pulses:  Normal pulses noted. Extremities:  No clubbing or edema.  No cyanosis. Neurologic:  Alert and oriented x3;  grossly normal neurologically. Skin:  Intact without significant lesions or rashes. No jaundice. Psych:  Alert and cooperative. Normal mood and affect.  Imaging Studies: No abdominal imaging  Assessment and Plan:   Angela Pacheco is a 53 y.o. female with status post appendectomy, cholecystectomy, Hashimoto's thyroiditis, positive ANA is seen in consultation for abdominal pain, bloating, postprandial diarrhea without weight loss, anemia.  She does have history of B12 deficiency.  Intolerance to gluten, dairy and soy  Celiac disease panel Immunoglobulin panel If these are negative, will order alpha gal panel and food allergy profile   Follow up in 2 months   Cephas Darby, MD

## 2019-11-30 LAB — CELIAC DISEASE PANEL
Endomysial IgA: NEGATIVE
IgA/Immunoglobulin A, Serum: 208 mg/dL (ref 87–352)
Transglutaminase IgA: <2 U/mL (ref 0–3)

## 2019-11-30 LAB — IMMUNOGLOBULINS A/E/G/M, SERUM
IgE (Immunoglobulin E), Serum: 12 IU/mL (ref 6–495)
IgG (Immunoglobin G), Serum: 981 mg/dL (ref 586–1602)
IgM (Immunoglobulin M), Srm: 189 mg/dL (ref 26–217)

## 2019-12-03 ENCOUNTER — Encounter: Payer: Self-pay | Admitting: Gastroenterology

## 2019-12-05 ENCOUNTER — Other Ambulatory Visit: Payer: Self-pay

## 2019-12-05 ENCOUNTER — Other Ambulatory Visit: Payer: Self-pay | Admitting: Gastroenterology

## 2019-12-05 DIAGNOSIS — K9041 Non-celiac gluten sensitivity: Secondary | ICD-10-CM

## 2019-12-06 ENCOUNTER — Other Ambulatory Visit: Payer: Self-pay

## 2019-12-06 DIAGNOSIS — Z20822 Contact with and (suspected) exposure to covid-19: Secondary | ICD-10-CM

## 2019-12-08 LAB — NOVEL CORONAVIRUS, NAA: SARS-CoV-2, NAA: NOT DETECTED

## 2019-12-11 ENCOUNTER — Encounter: Payer: Self-pay | Admitting: Internal Medicine

## 2019-12-11 ENCOUNTER — Telehealth: Payer: Self-pay | Admitting: Internal Medicine

## 2019-12-11 NOTE — Telephone Encounter (Signed)
Pt was tested for Covid 19 on 12/06/19 b/c she said she had a secondary exposure to someone with Covid. I told her our policy is that we have to wait 21 days before we can bring her in. She said she will be B12 deficient by then so I told her I will let Dr. Nicki Reaper know.

## 2019-12-11 NOTE — Telephone Encounter (Signed)
I have to recommend sticking with office policy.  We use the injections to get the level built up.  Her last level was improved.  I am ok if she wants to start oral B12 1076mcg and we will see if we can maintain the level with oral b12 .  Let me know if that is a problem.

## 2019-12-11 NOTE — Telephone Encounter (Signed)
Are you okay with her taking b12 1000 mcg PO q day until she can come into the office?

## 2019-12-11 NOTE — Telephone Encounter (Signed)
See my chart message

## 2019-12-13 ENCOUNTER — Ambulatory Visit: Payer: BC Managed Care – PPO

## 2019-12-31 ENCOUNTER — Ambulatory Visit (INDEPENDENT_AMBULATORY_CARE_PROVIDER_SITE_OTHER): Payer: BC Managed Care – PPO | Admitting: Internal Medicine

## 2019-12-31 ENCOUNTER — Encounter: Payer: Self-pay | Admitting: Internal Medicine

## 2019-12-31 ENCOUNTER — Encounter: Payer: Self-pay | Admitting: Gastroenterology

## 2019-12-31 ENCOUNTER — Other Ambulatory Visit: Payer: Self-pay

## 2019-12-31 DIAGNOSIS — E538 Deficiency of other specified B group vitamins: Secondary | ICD-10-CM | POA: Diagnosis not present

## 2019-12-31 DIAGNOSIS — M2550 Pain in unspecified joint: Secondary | ICD-10-CM | POA: Diagnosis not present

## 2019-12-31 DIAGNOSIS — M25519 Pain in unspecified shoulder: Secondary | ICD-10-CM

## 2019-12-31 DIAGNOSIS — E039 Hypothyroidism, unspecified: Secondary | ICD-10-CM | POA: Diagnosis not present

## 2019-12-31 MED ORDER — TIZANIDINE HCL 4 MG PO TABS: 4.0000 mg | ORAL_TABLET | Freq: Every evening | ORAL | 0 refills | Status: DC | PRN

## 2019-12-31 NOTE — Progress Notes (Signed)
Patient ID: Encarnacion Burri, female   DOB: 1966-04-07, 54 y.o.   MRN: [id number]   Virtual Visit via video Note  This visit type was conducted due to national recommendations for restrictions regarding the COVID-19 pandemic (e.g. social distancing).  This format is felt to be most appropriate for this patient at this time.  All issues noted in this document were discussed and addressed.  No physical exam was performed (except for noted visual exam findings with Video Visits).   I connected with [name] by a video enabled telemedicine application and verified that I am speaking with the correct person using two identifiers. Location patient: home Location provider: work Persons participating in the virtual visit: patient, provider  I discussed the limitations, risks, security and privacy concerns of performing an evaluation and management service by video and the availability of in person appointments. The patient expressed understanding and agreed to proceed.   Reason for visit: work in appt.    HPI: She reports that she was walking her son's puppy and he suddenly pulled away. This pulled her right arm.  Also pulled her left arm as well.  She has had pain in her shoulder and biceps.  Soreness across chest.  Took motrin.  Also took 1/2 muscle relaxer last night.  Is some better, but still with soreness.  Also reports increased joint pain - involving hands, elbows, shoulders and knees.  AM stiffness in hands.  Hands feel "puffy".  Recently changes to taking sublingual B12.  Feels more "brain fog", etc.  Wants to change back to B12 injections.  Recently quarantined - indirect covid exposure.  No cough or chest congestion reported.     ROS: See pertinent positives and negatives per HPI.  Past Medical History:  Diagnosis Date  . Allergy    cats trigger  . Asthma    mild  . Complication of anesthesia   . Hypothyroidism   . Thyroid disease     Past Surgical History:    Procedure Laterality Date  . ABDOMINAL HYSTERECTOMY     cervix left in place  . APPENDECTOMY  1991  . CHOLECYSTECTOMY    . COLONOSCOPY WITH PROPOFOL N/A 10/08/2019   Procedure: COLONOSCOPY WITH PROPOFOL;  Surgeon: Lin Landsman, MD;  Location: [facility] ENDOSCOPY;  Service: Gastroenterology;  Laterality: N/A;    Family History  Problem Relation Age of Onset  . Arthritis Mother   . Breast cancer Neg Hx     SOCIAL HX: reviewed.    Current Outpatient Medications:  .  Calcium 600-200 MG-UNIT tablet, Take 1 tablet by mouth 2 (two) times daily with a meal., Disp: , Rfl:  .  cyanocobalamin (,VITAMIN B-12,) 1000 MCG/ML injection, Inject 1,000 mcg into the muscle every 30 (thirty) days., Disp: , Rfl: 1 .  fluticasone (FLONASE) 50 MCG/ACT nasal spray, Place 2 sprays into both nostrils daily for 14 days., Disp: 16 g, Rfl: 0 .  levothyroxine (SYNTHROID) 88 MCG tablet, TAKE 1 TABLET BY MOUTH DAILY, Disp: 90 tablet, Rfl: 1 .  tiZANidine (ZANAFLEX) 4 MG tablet, Take 1 tablet (4 mg total) by mouth at bedtime as needed for muscle spasms., Disp: 15 tablet, Rfl: 0  EXAM:  GENERAL: alert, oriented, appears well and in no acute distress  HEENT: atraumatic, conjunttiva clear, no obvious abnormalities on inspection of external nose and ears  NECK: normal movements of the head and neck  LUNGS: on inspection no signs of respiratory distress, breathing rate appears normal, no obvious gross SOB,  gasping or wheezing  CV: no obvious cyanosis  MS: appears to have good rom in upper extremities.  Good rom - neck.    PSYCH/NEURO: pleasant and cooperative, no obvious depression or anxiety, speech and thought processing grossly intact  ASSESSMENT AND PLAN:  Discussed the following assessment and plan:  B12 deficiency Previously on B12 injections.  Recently taking sublingual B12.  Does not feel as good since taking oral B12.  Will restart injections.  Check B12 level.    Hypothyroidism On thyroid  replacement.  TSH wnl 07/2019.   Joint pain Increased joint pain as outlined.  Scheduled to have labs this week (through GI).  Check inflammatory markers.    Shoulder pain Back/shoulder pain as outlined.  Good rom.  Some better.  zanaflex as directed.  Follow.  Notify me if does not resolve or any worsening symptoms.     Orders Placed This Encounter  Procedures  . Sedimentation rate  . C-reactive protein  . Rheumatoid Factor  . Antinuclear Antib (ANA)  . B12  . CBC with Differential/Platelet    Meds ordered this encounter  Medications  . tiZANidine (ZANAFLEX) 4 MG tablet    Sig: Take 1 tablet (4 mg total) by mouth at bedtime as needed for muscle spasms.    Dispense:  15 tablet    Refill:  0     I discussed the assessment and treatment plan with the patient. The patient was provided an opportunity to ask questions and all were answered. The patient agreed with the plan and demonstrated an understanding of the instructions.   The patient was advised to call back or seek an in-person evaluation if the symptoms worsen or if the condition fails to improve as anticipated.   [name], MD

## 2019-12-31 NOTE — Telephone Encounter (Signed)
Ok

## 2019-12-31 NOTE — Telephone Encounter (Signed)
Are you ok with adding her on at 5:00?

## 2020-01-01 ENCOUNTER — Encounter: Payer: Self-pay | Admitting: Internal Medicine

## 2020-01-01 ENCOUNTER — Other Ambulatory Visit: Payer: Self-pay

## 2020-01-01 DIAGNOSIS — M25511 Pain in right shoulder: Secondary | ICD-10-CM | POA: Insufficient documentation

## 2020-01-01 DIAGNOSIS — M25519 Pain in unspecified shoulder: Secondary | ICD-10-CM | POA: Insufficient documentation

## 2020-01-01 NOTE — Assessment & Plan Note (Signed)
On thyroid replacement.  TSH wnl 07/2019.

## 2020-01-01 NOTE — Assessment & Plan Note (Signed)
Increased joint pain as outlined.  Scheduled to have labs this week (through GI).  Check inflammatory markers.

## 2020-01-01 NOTE — Assessment & Plan Note (Signed)
Previously on B12 injections.  Recently taking sublingual B12.  Does not feel as good since taking oral B12.  Will restart injections.  Check B12 level.

## 2020-01-01 NOTE — Assessment & Plan Note (Signed)
Back/shoulder pain as outlined.  Good rom.  Some better.  zanaflex as directed.  Follow.  Notify me if does not resolve or any worsening symptoms.

## 2020-01-02 ENCOUNTER — Ambulatory Visit (INDEPENDENT_AMBULATORY_CARE_PROVIDER_SITE_OTHER): Payer: BC Managed Care – PPO

## 2020-01-02 ENCOUNTER — Other Ambulatory Visit: Payer: Self-pay

## 2020-01-02 VITALS — Temp 97.0°F

## 2020-01-02 DIAGNOSIS — E538 Deficiency of other specified B group vitamins: Secondary | ICD-10-CM | POA: Diagnosis not present

## 2020-01-02 MED ORDER — CYANOCOBALAMIN 1000 MCG/ML IJ SOLN
1000.0000 ug | Freq: Once | INTRAMUSCULAR | Status: AC
  Administered 2020-01-02: 1000 ug via INTRAMUSCULAR

## 2020-01-02 NOTE — Progress Notes (Addendum)
Patient came in today for B-12 injection in right deltoid. Patient tolerated well.   Reviewed.  Dr Nicki Reaper

## 2020-01-03 ENCOUNTER — Encounter: Payer: Self-pay | Admitting: Internal Medicine

## 2020-01-03 LAB — CBC WITH DIFFERENTIAL/PLATELET
Basophils Absolute: 0 10*3/uL (ref 0.0–0.2)
Basos: 0 %
EOS (ABSOLUTE): 0.3 10*3/uL (ref 0.0–0.4)
Eos: 4 %
Hematocrit: 43.1 % (ref 34.0–46.6)
Hemoglobin: 14.7 g/dL (ref 11.1–15.9)
Immature Grans (Abs): 0 10*3/uL (ref 0.0–0.1)
Immature Granulocytes: 0 %
Lymphocytes Absolute: 2.2 10*3/uL (ref 0.7–3.1)
Lymphs: 30 %
MCH: 31.6 pg (ref 26.6–33.0)
MCHC: 34.1 g/dL (ref 31.5–35.7)
MCV: 93 fL (ref 79–97)
Monocytes Absolute: 0.6 10*3/uL (ref 0.1–0.9)
Monocytes: 7 %
Neutrophils Absolute: 4.3 10*3/uL (ref 1.4–7.0)
Neutrophils: 59 %
Platelets: 309 10*3/uL (ref 150–450)
RBC: 4.65 x10E6/uL (ref 3.77–5.28)
RDW: 12.3 % (ref 11.7–15.4)
WBC: 7.4 10*3/uL (ref 3.4–10.8)

## 2020-01-03 LAB — SEDIMENTATION RATE: Sed Rate: 17 mm/hr (ref 0–40)

## 2020-01-03 LAB — ANA: Anti Nuclear Antibody (ANA): NEGATIVE

## 2020-01-03 LAB — VITAMIN B12: Vitamin B-12: 1223 pg/mL (ref 232–1245)

## 2020-01-03 LAB — RHEUMATOID FACTOR: Rheumatoid fact SerPl-aCnc: <10 IU/mL (ref 0.0–13.9)

## 2020-01-03 LAB — C-REACTIVE PROTEIN: CRP: 2 mg/L (ref 0–10)

## 2020-01-08 LAB — ALPHA-GAL PANEL
Alpha Gal IgE*: <0.1 kU/L (ref <0.10)
Beef (Bos spp) IgE: <0.1 kU/L (ref <0.35)
Class Interpretation: 0
Class Interpretation: 0
Class Interpretation: 0
Lamb/Mutton (Ovis spp) IgE: <0.1 kU/L (ref <0.35)
Pork (Sus spp) IgE: <0.1 kU/L (ref <0.35)

## 2020-01-08 LAB — FOOD ALLERGY PROFILE
Allergen Corn, IgE: <0.1 kU/L
Clam IgE: <0.1 kU/L
Codfish IgE: <0.1 kU/L
Egg White IgE: <0.1 kU/L
Milk IgE: <0.1 kU/L
Peanut IgE: <0.1 kU/L
Scallop IgE: <0.1 kU/L
Sesame Seed IgE: <0.1 kU/L
Shrimp IgE: <0.1 kU/L
Soybean IgE: <0.1 kU/L
Walnut IgE: <0.1 kU/L
Wheat IgE: <0.1 kU/L

## 2020-01-08 LAB — CELIAC DISEASE HLA DQ ASSOC.
DQ2 (DQA1 0501/0505,DQB1 02XX): NEGATIVE
DQ8 (DQA1 03XX, DQB1 0302): NEGATIVE

## 2020-01-09 ENCOUNTER — Encounter: Payer: Self-pay | Admitting: Gastroenterology

## 2020-01-29 ENCOUNTER — Other Ambulatory Visit: Payer: Self-pay

## 2020-01-30 ENCOUNTER — Encounter: Payer: Self-pay | Admitting: Gastroenterology

## 2020-01-30 ENCOUNTER — Ambulatory Visit: Payer: BC Managed Care – PPO | Admitting: Gastroenterology

## 2020-01-30 ENCOUNTER — Ambulatory Visit (INDEPENDENT_AMBULATORY_CARE_PROVIDER_SITE_OTHER): Payer: BC Managed Care – PPO | Admitting: Gastroenterology

## 2020-01-30 DIAGNOSIS — K580 Irritable bowel syndrome with diarrhea: Secondary | ICD-10-CM

## 2020-01-30 NOTE — Progress Notes (Signed)
Sherri Sear, MD [address]  [place]  [place], [place] [address]  Main: [phone]  Fax: [phone]    Gastroenterology Consultation Video Visit  Referring Provider:     [name], MD Primary Care Physician:  [name], MD Primary Gastroenterologist:  Dr. Cephas Darby Reason for Consultation: IBS        HPI:   Latiqua Daloia is a 54 y.o. female referred by Dr. [name], MD  for consultation & management of IBS  Virtual Visit Video Note  I connected with Jacquie Lukes on 01/30/20 at  8:30 AM EST by video and verified that I am speaking with the correct person using two identifiers.   I discussed the limitations, risks, security and privacy concerns of performing an evaluation and management service by video and the availability of in person appointments. I also discussed with the patient that there may be a patient responsible charge related to this service. The patient expressed understanding and agreed to proceed.  Location of the Patient: Home  Location of the provider: Office  Persons participating in the visit: Patient and provider only   History of Present Illness: Ms. Axelson is seen for follow-up of test results.  She underwent genetic testing for celiac disease came back negative for HLA DQ 2 and DQ 8.  Alpha gal panel and food allergy profile were negative as well.  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  She takes probiotics regularly.  She does not have any other concerns today.   NSAIDs: None  Antiplts/Anticoagulants/Anti thrombotics: None  GI Procedures: Colonoscopy for colon cancer screening - Perianal skin tags found on perianal exam. - One 5 mm polyp in the cecum, removed with a cold snare. Resected and retrieved. -  The distal rectum and anal verge are normal on retroflexion view. - The examination was otherwise normal.  DIAGNOSIS:  A. COLON POLYP, CECUM; COLD SNARE:  - TUBULAR ADENOMA.  - NEGATIVE FOR HIGH-GRADE DYSPLASIA AND MALIGNANCY.   Past Medical History:  Diagnosis Date  . Allergy    cats trigger  . Asthma    mild  . Complication of anesthesia   . Hypothyroidism   . Thyroid disease     Past Surgical History:  Procedure Laterality Date  . ABDOMINAL HYSTERECTOMY     cervix left in place  . APPENDECTOMY  1991  . CHOLECYSTECTOMY    . COLONOSCOPY WITH PROPOFOL N/A 10/08/2019   Procedure: COLONOSCOPY WITH PROPOFOL;  Surgeon: Lin Landsman, MD;  Location: [facility] ENDOSCOPY;  Service: Gastroenterology;  Laterality: N/A;     Current Outpatient Medications:  .  cyanocobalamin (,VITAMIN B-12,) 1000 MCG/ML injection, Inject 1,000 mcg into the muscle every 30 (thirty) days., Disp: , Rfl: 1 .  levothyroxine (SYNTHROID) 88 MCG tablet, TAKE 1 TABLET BY MOUTH DAILY, Disp: 90 tablet, Rfl: 1 .  tiZANidine (ZANAFLEX) 4 MG tablet, Take 1 tablet (4 mg total) by mouth at bedtime as needed for muscle spasms., Disp: 15 tablet, Rfl: 0 .  Calcium 600-200 MG-UNIT tablet, Take 1 tablet by mouth 2 (two) times daily with a meal., Disp: , Rfl:  .  fluticasone (FLONASE) 50 MCG/ACT nasal spray, Place 2 sprays into both nostrils daily for 14 days., Disp: 16 g, Rfl: 0   Family History  Problem  Relation Age of Onset  . Arthritis Mother   . Breast cancer Neg Hx      Social History   Tobacco Use  . Smoking status: Never Smoker  . Smokeless tobacco: Never Used  Substance Use Topics  . Alcohol use: Yes    Alcohol/week: 0.0 standard drinks    Comment: occ.  . Drug use: No    Allergies as of 01/30/2020  . (No Known Allergies)    Imaging Studies: No abdominal imaging  Assessment and Plan:   Tashianna Broome is a 54 y.o. 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  Celiac genetic testing came back negative as well.  No further work-up from GI standpoint at this time.  She is due for surveillance colonoscopy in 09/2026   Follow Up Instructions:   I discussed the assessment and treatment plan with the patient. The patient was provided an opportunity to ask questions and all were answered. The patient agreed with the plan and demonstrated an understanding of the instructions.   The patient was advised to call back or seek an in-person evaluation if the symptoms worsen or if the condition fails to improve as anticipated.  I provided 10 minutes of face-to-face time during this encounter.   Follow up as needed   Cephas Darby, MD

## 2020-02-05 ENCOUNTER — Other Ambulatory Visit: Payer: Self-pay

## 2020-02-05 ENCOUNTER — Ambulatory Visit (INDEPENDENT_AMBULATORY_CARE_PROVIDER_SITE_OTHER): Payer: BC Managed Care – PPO | Admitting: Lab

## 2020-02-05 DIAGNOSIS — E538 Deficiency of other specified B group vitamins: Secondary | ICD-10-CM

## 2020-02-05 MED ORDER — CYANOCOBALAMIN 1000 MCG/ML IJ SOLN
1000.0000 ug | Freq: Once | INTRAMUSCULAR | Status: AC
  Administered 2020-02-05: 1000 ug via INTRAMUSCULAR

## 2020-02-05 NOTE — Progress Notes (Addendum)
Pt in the office today for Vit B-12 injection in L-Deltoid. Pt tolerated well.  Reviewed.  Dr Nicki Reaper

## 2020-03-05 ENCOUNTER — Ambulatory Visit (INDEPENDENT_AMBULATORY_CARE_PROVIDER_SITE_OTHER): Payer: BC Managed Care – PPO | Admitting: Lab

## 2020-03-05 ENCOUNTER — Other Ambulatory Visit: Payer: Self-pay

## 2020-03-05 DIAGNOSIS — E538 Deficiency of other specified B group vitamins: Secondary | ICD-10-CM

## 2020-03-05 MED ORDER — CYANOCOBALAMIN 1000 MCG/ML IJ SOLN
1000.0000 ug | Freq: Once | INTRAMUSCULAR | Status: AC
  Administered 2020-03-05: 1000 ug via INTRAMUSCULAR

## 2020-03-05 NOTE — Progress Notes (Addendum)
Pt in office today for B-12 injection in R-Deltoid. Pt tolerated well.  Reviewed.  Dr Scott 

## 2020-03-13 ENCOUNTER — Encounter: Payer: Self-pay | Admitting: Internal Medicine

## 2020-03-13 ENCOUNTER — Telehealth (INDEPENDENT_AMBULATORY_CARE_PROVIDER_SITE_OTHER): Payer: BC Managed Care – PPO | Admitting: Internal Medicine

## 2020-03-13 DIAGNOSIS — J4520 Mild intermittent asthma, uncomplicated: Secondary | ICD-10-CM

## 2020-03-13 DIAGNOSIS — E039 Hypothyroidism, unspecified: Secondary | ICD-10-CM

## 2020-03-13 DIAGNOSIS — J329 Chronic sinusitis, unspecified: Secondary | ICD-10-CM

## 2020-03-13 MED ORDER — AMOXICILLIN 875 MG PO TABS: 875.0000 mg | ORAL_TABLET | Freq: Two times a day (BID) | ORAL | 0 refills | Status: DC

## 2020-03-13 MED ORDER — LEVOTHYROXINE SODIUM 88 MCG PO TABS: ORAL_TABLET | ORAL | 1 refills | Status: DC

## 2020-03-13 NOTE — Progress Notes (Signed)
Patient ID: Angela Pacheco, female   DOB: January 12, 1966, 54 y.o.   MRN: [id number]   Virtual Visit via video Note  This visit type was conducted due to national recommendations for restrictions regarding the COVID-19 pandemic (e.g. social distancing).  This format is felt to be most appropriate for this patient at this time.  All issues noted in this document were discussed and addressed.  No physical exam was performed (except for noted visual exam findings with Video Visits).   I connected with [name] by a video enabled telemedicine application and verified that I am speaking with the correct person using two identifiers. Location patient: home Location provider: work Persons participating in the virtual visit: patient, provider  The limitations, risks, security and privacy concerns of performing an evaluation and management service by video and the availability of in person appointments have been discussed.   The patient expressed understanding and agreed to proceed.   Reason for visit: scheduled follow up.    HPI: Increased stress recently.  Son moved to [place].  Daughter home.  Planning a wedding.  Overall she feels she is handling things relatively well.  Does report increased sinus pressure - more right side.  Ears full. Some nasal congestion - yellow.  Persistent symptoms.  Using nasal spray.  No chest congestion, chest pain or sob..  No acid reflux reported.  No nausea, vomiting or abdominal pain reported.  No fever.    ROS: See pertinent positives and negatives per HPI.  Past Medical History:  Diagnosis Date  . Allergy    cats trigger  . Asthma    mild  . B12 deficiency 05/06/2019  . Complication of anesthesia   . Hypothyroidism   . Thyroid disease     Past Surgical History:  Procedure Laterality Date  . ABDOMINAL HYSTERECTOMY     cervix left in place  . APPENDECTOMY  1991  . CHOLECYSTECTOMY    . COLONOSCOPY WITH PROPOFOL N/A 10/08/2019   Procedure:  COLONOSCOPY WITH PROPOFOL;  Surgeon: Lin Landsman, MD;  Location: [facility] ENDOSCOPY;  Service: Gastroenterology;  Laterality: N/A;    Family History  Problem Relation Age of Onset  . Arthritis Mother   . Breast cancer Neg Hx     SOCIAL HX: reviewed.    Current Outpatient Medications:  .  cyanocobalamin (,VITAMIN B-12,) 1000 MCG/ML injection, Inject 1,000 mcg into the muscle every 30 (thirty) days., Disp: , Rfl: 1 .  levothyroxine (SYNTHROID) 88 MCG tablet, TAKE 1 TABLET BY MOUTH DAILY, Disp: 90 tablet, Rfl: 1 .  amoxicillin (AMOXIL) 875 MG tablet, Take 1 tablet (875 mg total) by mouth 2 (two) times daily., Disp: 20 tablet, Rfl: 0 .  Calcium 600-200 MG-UNIT tablet, Take 1 tablet by mouth 2 (two) times daily with a meal., Disp: , Rfl:   EXAM:  GENERAL: alert, oriented, appears well and in no acute distress  HEENT: atraumatic, conjunttiva clear, no obvious abnormalities on inspection of external nose and ears  NECK: normal movements of the head and neck  LUNGS: on inspection no signs of respiratory distress, breathing rate appears normal, no obvious gross SOB, gasping or wheezing  CV: no obvious cyanosis  PSYCH/NEURO: pleasant and cooperative, no obvious depression or anxiety, speech and thought processing grossly intact  ASSESSMENT AND PLAN:  Discussed the following assessment and plan:  Asthma Breathing stable.  No sob.    Hypothyroidism On thyroid replacement.  Follow tsh.   Sinusitis Symptoms appear to be c/w sinus infection.  Treat with amoxicillin as directed.  Continue nasal spray.  Mucinex/robitussin as directed.  Follow.    Meds ordered this encounter  Medications  . levothyroxine (SYNTHROID) 88 MCG tablet    Sig: TAKE 1 TABLET BY MOUTH DAILY    Dispense:  90 tablet    Refill:  1  . amoxicillin (AMOXIL) 875 MG tablet    Sig: Take 1 tablet (875 mg total) by mouth 2 (two) times daily.    Dispense:  20 tablet    Refill:  0     I discussed the  assessment and treatment plan with the patient. The patient was provided an opportunity to ask questions and all were answered. The patient agreed with the plan and demonstrated an understanding of the instructions.   The patient was advised to call back or seek an in-person evaluation if the symptoms worsen or if the condition fails to improve as anticipated.    [name], MD

## 2020-03-16 ENCOUNTER — Encounter: Payer: Self-pay | Admitting: Internal Medicine

## 2020-03-16 DIAGNOSIS — J329 Chronic sinusitis, unspecified: Secondary | ICD-10-CM | POA: Insufficient documentation

## 2020-03-16 NOTE — Assessment & Plan Note (Signed)
On thyroid replacement.  Follow tsh.  

## 2020-03-16 NOTE — Assessment & Plan Note (Signed)
Symptoms appear to be c/w sinus infection.  Treat with amoxicillin as directed.  Continue nasal spray.  Mucinex/robitussin as directed.  Follow.

## 2020-03-16 NOTE — Assessment & Plan Note (Signed)
Breathing stable.  No sob.   

## 2020-04-09 ENCOUNTER — Ambulatory Visit (INDEPENDENT_AMBULATORY_CARE_PROVIDER_SITE_OTHER): Payer: BC Managed Care – PPO

## 2020-04-09 ENCOUNTER — Other Ambulatory Visit: Payer: Self-pay

## 2020-04-09 DIAGNOSIS — E538 Deficiency of other specified B group vitamins: Secondary | ICD-10-CM | POA: Diagnosis not present

## 2020-04-09 MED ORDER — CYANOCOBALAMIN 1000 MCG/ML IJ SOLN
1000.0000 ug | Freq: Once | INTRAMUSCULAR | Status: AC
  Administered 2020-04-09: 1000 ug via INTRAMUSCULAR

## 2020-04-09 NOTE — Progress Notes (Signed)
Patient presented for B 12 injection to right deltoid, patient voiced no concerns nor showed any signs of distress during injection. 

## 2020-05-14 ENCOUNTER — Ambulatory Visit (INDEPENDENT_AMBULATORY_CARE_PROVIDER_SITE_OTHER): Payer: BC Managed Care – PPO

## 2020-05-14 ENCOUNTER — Ambulatory Visit: Payer: BC Managed Care – PPO

## 2020-05-14 ENCOUNTER — Other Ambulatory Visit: Payer: Self-pay

## 2020-05-14 DIAGNOSIS — E538 Deficiency of other specified B group vitamins: Secondary | ICD-10-CM | POA: Diagnosis not present

## 2020-05-14 MED ORDER — CYANOCOBALAMIN 1000 MCG/ML IJ SOLN
1000.0000 ug | Freq: Once | INTRAMUSCULAR | Status: AC
  Administered 2020-05-14: 1000 ug via INTRAMUSCULAR

## 2020-05-14 NOTE — Progress Notes (Addendum)
Patient presented for B 12 injection to left deltoid, patient voiced no concerns nor showed any signs of distress during injection.  Reviewed.  Dr Scott 

## 2020-06-16 ENCOUNTER — Other Ambulatory Visit: Payer: Self-pay | Admitting: Internal Medicine

## 2020-06-16 ENCOUNTER — Encounter: Payer: Self-pay | Admitting: Internal Medicine

## 2020-06-18 ENCOUNTER — Ambulatory Visit (INDEPENDENT_AMBULATORY_CARE_PROVIDER_SITE_OTHER): Payer: BC Managed Care – PPO | Admitting: Internal Medicine

## 2020-06-18 ENCOUNTER — Ambulatory Visit: Payer: BC Managed Care – PPO

## 2020-06-18 ENCOUNTER — Encounter: Payer: Self-pay | Admitting: Internal Medicine

## 2020-06-18 ENCOUNTER — Other Ambulatory Visit: Payer: Self-pay

## 2020-06-18 VITALS — BP 106/60 | HR 74 | Temp 97.8°F | Resp 16 | Ht 60.0 in | Wt 136.0 lb

## 2020-06-18 DIAGNOSIS — E538 Deficiency of other specified B group vitamins: Secondary | ICD-10-CM | POA: Diagnosis not present

## 2020-06-18 DIAGNOSIS — R2230 Localized swelling, mass and lump, unspecified upper limb: Secondary | ICD-10-CM | POA: Insufficient documentation

## 2020-06-18 DIAGNOSIS — E039 Hypothyroidism, unspecified: Secondary | ICD-10-CM

## 2020-06-18 DIAGNOSIS — R222 Localized swelling, mass and lump, trunk: Secondary | ICD-10-CM | POA: Diagnosis not present

## 2020-06-18 MED ORDER — CYANOCOBALAMIN 1000 MCG/ML IJ SOLN
1000.0000 ug | Freq: Once | INTRAMUSCULAR | Status: AC
  Administered 2020-06-18: 1000 ug via INTRAMUSCULAR

## 2020-06-18 NOTE — Progress Notes (Signed)
Patient ID: Angela Pacheco, female   DOB: [birth year], 54 y.o.   MRN: [id number]   Subjective:    Patient ID: Angela Pacheco, female    DOB: [birth year], 54 y.o.   MRN: [id number]  HPI This visit occurred during the SARS-CoV-2 public health emergency.  Safety protocols were in place, including screening questions prior to the visit, additional usage of staff PPE, and extensive cleaning of exam room while observing appropriate contact time as indicated for disinfecting solutions.  Patient here as a work in with concerns regarding soreness and puffiness under right arm.  Noticed fullness over the last couple of months.  No nipple discharge.  No distinct nodule.  Eating  No nausea or vomiting.  No abdominal pain.  Bowels moving.  Had covid vaccine - opposite arm.  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    Past Medical History:  Diagnosis Date  . Allergy    cats trigger  . Asthma    mild  . B12 deficiency 05/06/2019  . Complication of anesthesia   . Hypothyroidism   . Thyroid disease    Past Surgical History:  Procedure Laterality Date  . ABDOMINAL HYSTERECTOMY     cervix left in place  . APPENDECTOMY  1991  . CHOLECYSTECTOMY    . COLONOSCOPY WITH PROPOFOL N/A 10/08/2019   Procedure: COLONOSCOPY WITH PROPOFOL;  Surgeon: Lin Landsman, MD;  Location: [facility] ENDOSCOPY;  Service: Gastroenterology;  Laterality: N/A;   Family History  Problem Relation Age of Onset  . Arthritis Mother   . Breast cancer Neg Hx    Social History   Socioeconomic History  . Marital status: Married    Spouse name: Not on file  . Number of children: Not on file  . Years of education: Not on file  . Highest education level: Not on file  Occupational History  . Not on file  Tobacco Use  . Smoking status: Never Smoker  . Smokeless tobacco: Never Used  Substance and Sexual Activity  . Alcohol use: Yes    Alcohol/week: 0.0 standard drinks    Comment: occ.  . Drug use: No  . Sexual  activity: Not on file  Other Topics Concern  . Not on file  Social History Narrative  . Not on file   Social Determinants of Health   Financial Resource Strain:   . Difficulty of Paying Living Expenses:   Food Insecurity:   . Worried About Charity fundraiser in the Last Year:   . Arboriculturist in the Last Year:   Transportation Needs:   . Film/video editor (Medical):   Marland Kitchen Lack of Transportation (Non-Medical):   Physical Activity:   . Days of Exercise per Week:   . Minutes of Exercise per Session:   Stress:   . Feeling of Stress :   Social Connections:   . Frequency of Communication with Friends and Family:   . Frequency of Social Gatherings with Friends and Family:   . Attends Religious Services:   . Active Member of Clubs or Organizations:   . Attends Archivist Meetings:   Marland Kitchen Marital Status:     Outpatient Encounter Medications as of 06/18/2020  Medication Sig  . Calcium 600-200 MG-UNIT tablet Take 1 tablet by mouth 2 (two) times daily with a meal.  . cyanocobalamin (,VITAMIN B-12,) 1000 MCG/ML injection Inject 1,000 mcg into the muscle every 30 (thirty) days.  Marland Kitchen levothyroxine (SYNTHROID) 88 MCG tablet TAKE 1  TABLET BY MOUTH DAILY  . [DISCONTINUED] amoxicillin (AMOXIL) 875 MG tablet Take 1 tablet (875 mg total) by mouth 2 (two) times daily.  . [EXPIRED] cyanocobalamin ((VITAMIN B-12)) injection 1,000 mcg    No facility-administered encounter medications on file as of 06/18/2020.    Review of Systems  Constitutional: Negative for appetite change and unexpected weight change.  HENT: Negative for congestion and sinus pressure.   Respiratory: Negative for cough, chest tightness and shortness of breath.   Cardiovascular: Negative for chest pain, palpitations and leg swelling.  Gastrointestinal: Negative for abdominal pain, diarrhea, nausea and vomiting.  Genitourinary: Negative for difficulty urinating and dysuria.  Musculoskeletal: Negative for joint  swelling and myalgias.  Skin: Negative for color change and rash.  Neurological: Negative for dizziness, light-headedness and headaches.  Psychiatric/Behavioral: Negative for agitation and dysphoric mood.       Objective:    Physical Exam Constitutional:      General: She is not in acute distress.    Appearance: Normal appearance.  HENT:     Head: Normocephalic and atraumatic.     Right Ear: External ear normal.     Left Ear: External ear normal.  Eyes:     General: No scleral icterus.       Right eye: No discharge.        Left eye: No discharge.     Conjunctiva/sclera: Conjunctivae normal.  Neck:     Thyroid: No thyromegaly.  Cardiovascular:     Rate and Rhythm: Normal rate and regular rhythm.  Pulmonary:     Effort: No respiratory distress.     Breath sounds: Normal breath sounds. No wheezing.     Comments: Breasts:  No nipple discharge or nipple retraction present.  No distinct nodules.  Increased fullness right axilla.   Abdominal:     General: Bowel sounds are normal.     Palpations: Abdomen is soft.     Tenderness: There is no abdominal tenderness.  Musculoskeletal:        General: No swelling or tenderness.     Cervical back: Neck supple. No tenderness.  Lymphadenopathy:     Cervical: No cervical adenopathy.  Skin:    Findings: No erythema or rash.  Neurological:     Mental Status: She is alert.  Psychiatric:        Mood and Affect: Mood normal.        Behavior: Behavior normal.     BP 106/60   Pulse 74   Temp 97.8 F (36.6 C)   Resp 16   Ht 5' (1.524 m)   Wt 136 lb (61.7 kg)   SpO2 99%   BMI 26.56 kg/m  Wt Readings from Last 3 Encounters:  06/18/20 136 lb (61.7 kg)  03/13/20 134 lb (60.8 kg)  01/01/20 134 lb (60.8 kg)     Lab Results  Component Value Date   WBC 7.4 01/02/2020   HGB 14.7 01/02/2020   HCT 43.1 01/02/2020   PLT 309 01/02/2020   GLUCOSE 79 08/13/2019   CHOL 197 08/13/2019   TRIG 116.0 08/13/2019   HDL 52.90 08/13/2019    LDLCALC 121 (H) 08/13/2019   ALT 21 08/13/2019   AST 17 08/13/2019   NA 140 08/13/2019   K 4.1 08/13/2019   CL 105 08/13/2019   CREATININE 0.86 08/13/2019   BUN 19 08/13/2019   CO2 24 08/13/2019   TSH 1.94 08/13/2019       Assessment & Plan:   Problem List Items  Addressed This Visit    Axillary fullness    Right axillary fullness as outlined.  Exam as outlined.  Check diagnostic mammogram with ultrasound.  Further w/up pending results.        Relevant Orders   MM DIAG BREAST TOMO BILATERAL   US BREAST LTD UNI RIGHT INC AXILLA   US BREAST LTD UNI LEFT INC AXILLA   Hypothyroidism    On thyroid replacement.  Follow tsh.         Other Visit Diagnoses    B12 deficiency    -  Primary   Relevant Medications   cyanocobalamin ((VITAMIN B-12)) injection 1,000 mcg (Completed)       [name], MD

## 2020-06-22 ENCOUNTER — Encounter: Payer: Self-pay | Admitting: Internal Medicine

## 2020-06-22 NOTE — Assessment & Plan Note (Signed)
Right axillary fullness as outlined.  Exam as outlined.  Check diagnostic mammogram with ultrasound.  Further w/up pending results.

## 2020-06-22 NOTE — Assessment & Plan Note (Signed)
On thyroid replacement.  Follow tsh.  

## 2020-06-27 ENCOUNTER — Ambulatory Visit
Admission: RE | Admit: 2020-06-27 | Discharge: 2020-06-27 | Disposition: A | Discharge status: Home / Self Care | Payer: BC Managed Care – PPO | Source: Ambulatory Visit | Attending: Internal Medicine | Admitting: Internal Medicine

## 2020-06-27 ENCOUNTER — Other Ambulatory Visit: Payer: Self-pay | Admitting: Internal Medicine

## 2020-06-27 DIAGNOSIS — R222 Localized swelling, mass and lump, trunk: Secondary | ICD-10-CM | POA: Diagnosis present

## 2020-06-27 DIAGNOSIS — R2230 Localized swelling, mass and lump, unspecified upper limb: Secondary | ICD-10-CM

## 2020-07-01 ENCOUNTER — Telehealth: Payer: Self-pay | Admitting: Internal Medicine

## 2020-07-01 NOTE — Telephone Encounter (Signed)
See result note.  

## 2020-07-01 NOTE — Telephone Encounter (Signed)
Pt was returning your call.

## 2020-07-22 ENCOUNTER — Other Ambulatory Visit: Payer: Self-pay

## 2020-07-22 ENCOUNTER — Ambulatory Visit (INDEPENDENT_AMBULATORY_CARE_PROVIDER_SITE_OTHER): Payer: BC Managed Care – PPO

## 2020-07-22 DIAGNOSIS — E538 Deficiency of other specified B group vitamins: Secondary | ICD-10-CM

## 2020-07-22 MED ORDER — CYANOCOBALAMIN 1000 MCG/ML IJ SOLN
1000.0000 ug | Freq: Once | INTRAMUSCULAR | Status: AC
  Administered 2020-07-22: 1000 ug via INTRAMUSCULAR

## 2020-07-22 NOTE — Progress Notes (Addendum)
Patient presented for B 12 injection to left quad/teaching. Patient would like to start doing these at home. Patient was showed the directions and demonstrated on how to give and injection. Patient was able to give herself an injection at appointment today. Patient voiced no concerns nor showed any signs of distress during injection.  Patient would like to have an RX sent top pharmacy so she can do them at home. Please advise.  Reviewed.  Ok to start doing injections at home.  Ok to send in rx.    Dr Nicki Reaper

## 2020-08-01 ENCOUNTER — Other Ambulatory Visit: Payer: Self-pay

## 2020-08-01 ENCOUNTER — Encounter: Payer: Self-pay | Admitting: Internal Medicine

## 2020-08-01 MED ORDER — "BD INTEGRA SYRINGE 25G X 1"" 3 ML MISC": 0 refills | Status: DC

## 2020-08-01 MED ORDER — CYANOCOBALAMIN 1000 MCG/ML IJ SOLN: 1000.0000 ug | INTRAMUSCULAR | 0 refills | Status: DC

## 2020-08-08 ENCOUNTER — Ambulatory Visit
Admission: RE | Admit: 2020-08-08 | Discharge: 2020-08-08 | Disposition: A | Discharge status: Home / Self Care | Payer: BC Managed Care – PPO | Source: Ambulatory Visit | Attending: Internal Medicine | Admitting: Internal Medicine

## 2020-08-08 ENCOUNTER — Encounter: Payer: Self-pay | Admitting: Internal Medicine

## 2020-08-08 ENCOUNTER — Other Ambulatory Visit: Payer: Self-pay

## 2020-08-08 VITALS — BP 111/81 | HR 87 | Temp 97.4°F | Resp 18 | Ht 60.0 in | Wt 138.0 lb

## 2020-08-08 DIAGNOSIS — Z79899 Other long term (current) drug therapy: Secondary | ICD-10-CM | POA: Insufficient documentation

## 2020-08-08 DIAGNOSIS — J019 Acute sinusitis, unspecified: Secondary | ICD-10-CM | POA: Insufficient documentation

## 2020-08-08 DIAGNOSIS — E039 Hypothyroidism, unspecified: Secondary | ICD-10-CM | POA: Insufficient documentation

## 2020-08-08 DIAGNOSIS — Z20822 Contact with and (suspected) exposure to covid-19: Secondary | ICD-10-CM | POA: Diagnosis not present

## 2020-08-08 DIAGNOSIS — Z6826 Body mass index (BMI) 26.0-26.9, adult: Secondary | ICD-10-CM | POA: Diagnosis not present

## 2020-08-08 DIAGNOSIS — Z79890 Hormone replacement therapy (postmenopausal): Secondary | ICD-10-CM | POA: Diagnosis not present

## 2020-08-08 DIAGNOSIS — B9789 Other viral agents as the cause of diseases classified elsewhere: Secondary | ICD-10-CM | POA: Diagnosis not present

## 2020-08-08 DIAGNOSIS — E538 Deficiency of other specified B group vitamins: Secondary | ICD-10-CM | POA: Diagnosis not present

## 2020-08-08 DIAGNOSIS — E669 Obesity, unspecified: Secondary | ICD-10-CM | POA: Insufficient documentation

## 2020-08-08 DIAGNOSIS — J45909 Unspecified asthma, uncomplicated: Secondary | ICD-10-CM | POA: Insufficient documentation

## 2020-08-08 MED ORDER — GUAIFENESIN ER 600 MG PO TB12: 600.0000 mg | ORAL_TABLET | Freq: Two times a day (BID) | ORAL | 0 refills | Status: AC

## 2020-08-08 MED ORDER — FLUTICASONE PROPIONATE 50 MCG/ACT NA SUSP
1.0000 | Freq: Every day | NASAL | 0 refills | Status: DC
Start: 2020-08-08 — End: 2020-09-19

## 2020-08-08 NOTE — ED Triage Notes (Signed)
Patient in today w/ c/o cough, sinus congestion and drainage, and sore throat x 2 days. Patient denies fever, N/V/D, chills. Ibuprofen and OTC sinus medications used at home to tx sx.

## 2020-08-08 NOTE — Telephone Encounter (Signed)
Spoke to Angela Pacheco about her message. She states that she is currently in the ER room and waiting to speak to a provider. She has been waiting for 4 hours and states that she will stay because she wants to be seen. Her symptoms are continuing and she is still producing dark green and yellow mucus.

## 2020-08-09 LAB — SARS CORONAVIRUS 2 (TAT 6-24 HRS): SARS Coronavirus 2: NEGATIVE

## 2020-08-10 NOTE — ED Provider Notes (Signed)
[facility]    CSN: [id number] Arrival date & time: 08/08/20  1345      History   Chief Complaint Chief Complaint  Patient presents with  . Cough  . Nasal Congestion  . Sore Throat    HPI Angela Pacheco is a 54 y.o. female who comes to the urgent care with a 2-day history of sinus pressure, cough and postnasal drainage.  Patient says symptoms started insidiously and has been persistent.  She denies any loss of taste and smell.  No fever or chills.  No sick contacts.  She has tried ibuprofen and over-the-counter medications with no improvement.  No known history of seasonal allergies.Marland Kitchen   HPI  Past Medical History:  Diagnosis Date  . Allergy    cats trigger  . Asthma    mild  . B12 deficiency 05/06/2019  . Complication of anesthesia   . Hypothyroidism   . Thyroid disease     Patient Active Problem List   Diagnosis Date Noted  . Axillary fullness 06/18/2020  . Sinusitis 03/16/2020  . Shoulder pain 01/01/2020  . Neck fullness 01/20/2019  . Family history of lupus erythematosus 10/04/2018  . Positive ANA (antinuclear antibody) 10/04/2018  . Joint pain 09/15/2018  . Immunity to measles determined by serologic test 06/30/2018  . Abnormal mammogram 07/07/2016  . Overweight 02/11/2016  . Health care maintenance 04/05/2015  . Environmental allergies 01/17/2015  . Asthma 01/17/2015  . Menopausal symptoms 01/17/2015  . Endometriosis 01/17/2015  . Hypothyroidism 01/14/2015    Past Surgical History:  Procedure Laterality Date  . ABDOMINAL HYSTERECTOMY     cervix left in place  . APPENDECTOMY  1991  . CHOLECYSTECTOMY    . COLONOSCOPY WITH PROPOFOL N/A 10/08/2019   Procedure: COLONOSCOPY WITH PROPOFOL;  Surgeon: Lin Landsman, MD;  Location: [facility] ENDOSCOPY;  Service: Gastroenterology;  Laterality: N/A;    OB History   No obstetric history on file.      Home Medications    Prior to Admission medications   Medication Sig Start Date End  Date Taking? Authorizing Provider  cyanocobalamin (,VITAMIN B-12,) 1000 MCG/ML injection Inject 1 mL (1,000 mcg total) into the muscle every 30 (thirty) days. 08/01/20  Yes [name], MD  levothyroxine (SYNTHROID) 88 MCG tablet TAKE 1 TABLET BY MOUTH DAILY 03/13/20  Yes Scott, Randell Patient, MD  SYRINGE-NEEDLE, DISP, 3 ML (BD INTEGRA SYRINGE) 25G X 1" 3 ML MISC Use as directed with B12 injections 08/01/20  Yes [name], MD  Calcium 600-200 MG-UNIT tablet Take 1 tablet by mouth 2 (two) times daily with a meal. 01/12/19 01/12/20  [provider]  fluticasone (FLONASE) 50 MCG/ACT nasal spray Place 1 spray into both nostrils daily. 08/08/20   Jeriann Sayres, Myrene Galas, MD  guaiFENesin (MUCINEX) 600 MG 12 hr tablet Take 1 tablet (600 mg total) by mouth 2 (two) times daily for 10 days. 08/08/20 08/18/20  Chase Picket, MD    Family History Family History  Problem Relation Age of Onset  . Arthritis Mother   . Breast cancer Neg Hx     Social History Social History   Tobacco Use  . Smoking status: Never Smoker  . Smokeless tobacco: Never Used  Substance Use Topics  . Alcohol use: Yes    Alcohol/week: 0.0 standard drinks    Comment: occ.  . Drug use: No     Allergies   Patient has no known allergies.   Review of Systems Review of Systems  Constitutional: Negative.  HENT: Positive for congestion, postnasal drip, sinus pressure, sinus pain and sore throat.   Eyes: Negative for pain, redness and itching.  Respiratory: Positive for cough. Negative for shortness of breath and wheezing.   Cardiovascular: Negative for chest pain.  Gastrointestinal: Negative for abdominal pain.     Physical Exam Triage Vital Signs ED Triage Vitals  Enc Vitals Group     BP 08/08/20 1548 111/81     Pulse Rate 08/08/20 1548 87     Resp 08/08/20 1548 18     Temp 08/08/20 1548 (!) 97.4 F (36.3 C)     Temp Source 08/08/20 1548 Oral     SpO2 08/08/20 1548 100 %     Weight 08/08/20 1551 138 lb  (62.6 kg)     Height 08/08/20 1551 5' (1.524 m)     Head Circumference --      Peak Flow --      Pain Score 08/08/20 1550 5     Pain Loc --      Pain Edu? --      Excl. in [place]? --    No data found.  Updated Vital Signs BP 111/81 (BP Location: Right Arm)   Pulse 87   Temp (!) 97.4 F (36.3 C) (Oral)   Resp 18   Ht 5' (1.524 m)   Wt 62.6 kg   SpO2 100%   BMI 26.95 kg/m   Visual Acuity Right Eye Distance:   Left Eye Distance:   Bilateral Distance:    Right Eye Near:   Left Eye Near:    Bilateral Near:     Physical Exam Vitals and nursing note reviewed.  Constitutional:      Appearance: She is not ill-appearing.  HENT:     Head: Normocephalic and atraumatic.     Right Ear: Tympanic membrane normal.     Left Ear: Tympanic membrane normal.     Mouth/Throat:     Mouth: Mucous membranes are moist. Mucous membranes are pale. No oral lesions.     Pharynx: No posterior oropharyngeal erythema.     Tonsils: No tonsillar exudate or tonsillar abscesses. 0 on the right. 0 on the left.  Cardiovascular:     Rate and Rhythm: Normal rate and regular rhythm.     Heart sounds: Normal heart sounds.  Pulmonary:     Effort: Pulmonary effort is normal.     Breath sounds: Normal breath sounds. No wheezing or rhonchi.  Neurological:     Mental Status: She is alert.      UC Treatments / Results  Labs (all labs ordered are listed, but only abnormal results are displayed) Labs Reviewed  SARS CORONAVIRUS 2 (TAT 6-24 HRS)    EKG   Radiology No results found.  Procedures Procedures (including critical care time)  Medications Ordered in UC Medications - No data to display  Initial Impression / Assessment and Plan / UC Course  I have reviewed the triage vital signs and the nursing notes.  Pertinent labs & imaging results that were available during my care of the patient were reviewed by me and considered in my medical decision making (see chart for details).      1.   Acute sinusitis likely viral: Fluticasone nasal spray Mucinex as needed This is likely viral infection and there is no indication for antibiotic use.  Patient was requesting antibiotics.  According to the patient she always has nasal congestion/sinusitis on the right side I have advised the patient to follow-up  with ENT to be evaluated for possible nasal septal deviation.  Patient verbalizes understanding. If symptoms persist patient is advised to return to the urgent care to be reevaluated.   Final Clinical Impressions(s) / UC Diagnoses   Final diagnoses:  Acute viral sinusitis   Discharge Instructions   None    ED Prescriptions    Medication Sig Dispense Auth. Provider   fluticasone (FLONASE) 50 MCG/ACT nasal spray Place 1 spray into both nostrils daily. 16 g Chase Picket, MD   guaiFENesin (MUCINEX) 600 MG 12 hr tablet Take 1 tablet (600 mg total) by mouth 2 (two) times daily for 10 days. 20 tablet Eyleen Rawlinson, Myrene Galas, MD     PDMP not reviewed this encounter.   Chase Picket, MD 08/10/20 [phone]

## 2020-08-12 NOTE — Telephone Encounter (Signed)
Ok to schedule.

## 2020-08-14 NOTE — Telephone Encounter (Signed)
Patient scheduled.

## 2020-08-15 ENCOUNTER — Telehealth: Payer: BC Managed Care – PPO | Admitting: Internal Medicine

## 2020-09-01 ENCOUNTER — Encounter: Payer: Self-pay | Admitting: Internal Medicine

## 2020-09-02 ENCOUNTER — Telehealth: Payer: BC Managed Care – PPO | Admitting: Internal Medicine

## 2020-09-02 ENCOUNTER — Telehealth: Payer: Self-pay | Admitting: Internal Medicine

## 2020-09-02 NOTE — Telephone Encounter (Signed)
Pt has been triaged and note sent to Dr. Nicki Reaper and Larena Glassman.

## 2020-09-02 NOTE — Telephone Encounter (Signed)
Pt states that she was on hold for a while for a nurse and she had to hang up. Pt is calling back-transferred to [place]

## 2020-09-02 NOTE — Telephone Encounter (Signed)
Pt declined going to urgent care. Per Dr Nicki Reaper, [place] to do virtual as long as stable. She agreed to go to acute care if any changes. Scheduled virtual for tomorrow. Patient stated that she feels ok and was able to work all day today. She feels like this is inner ear which she has had before. No more episodes of blacking out.

## 2020-09-02 NOTE — Telephone Encounter (Signed)
See my chart message

## 2020-09-02 NOTE — Telephone Encounter (Signed)
Transferred to Access Nurse no one picked up after 67mins sent to [facility] for Triage   Pt called she is having Dizziness and weakness and blacked out over the weekend..   Below is Mychart message pt sent  Dizziness 09/01/2020 1:35 PM Reply  To: [facility]  From: [name]  Created: 09/01/2020 1:35 PM   Good afternoon.  I was hoping that I might be able to schedule an appt with you Tuesday.  On Saturday I was in the pool just floating. I turned over on the raft and my limbs got very tingly and I did blackout for just a few seconds. Since then ive had episodes of dizziness, feeling nauseas and dizziness with like changing position or temperature or pressure changes. I've been told I had early stage menieres years ago. So I'm not sure if maybe im just having some ear trouble after I had a cold a few weeks ago. I was tested for covid at that time which was negative. Right now feeling very tired since the episode and my ears feel very full and some nausea when I feel dizzy  Hopefully we can get an appt.   Malachy Mood

## 2020-09-02 NOTE — Telephone Encounter (Signed)
Sent to [facility] for triage

## 2020-09-02 NOTE — Telephone Encounter (Signed)
If she blacked out and had loss of sensation, she does need to be evaluated.  May need scan, etc.  I do not mind scheduling an earlier virtual appt with her.  Can screen - I don't know that she would pass screening to come in office.

## 2020-09-02 NOTE — Telephone Encounter (Signed)
Pt c/o arm and leg tingling and loss of sensation since 08/30/2020. She blacked out when sitting in an inflatable in her pool. Pt c/o vertigo,arm tingling, leg tingling, and sinus discomfort. Mandie believes she may have fluid in her ears and states that she has Meniere's disease and would like to be seen by Dr. Nicki Reaper. I explained that the soonest appointment for Dr. Nicki Reaper is 09/12/20 at [place]. I advised that if she is still feeling discomfort since 08/30/20, she should go to an urgent care to be evaluated. She refused going to an urgent care to be evaluated and requests to be seen today for her arm tingling and discomfort. I explained that Dr. Nicki Reaper does not have any availabilities for today and offered another provider and again explained the importance of going to an urgent care or ER if her symptoms become worse. Jaiyanna declined and stated that she would rather talk to [place] as I am simply following protocol and not giving a soon enough appointment. She requests a call back from [place] and to be given a sooner appointment. She agreed to be scheduled for 09/12/20 for a video visit. Patient has been scheduled.

## 2020-09-03 ENCOUNTER — Telehealth: Payer: Self-pay | Admitting: Internal Medicine

## 2020-09-03 ENCOUNTER — Telehealth (INDEPENDENT_AMBULATORY_CARE_PROVIDER_SITE_OTHER): Payer: BC Managed Care – PPO | Admitting: Internal Medicine

## 2020-09-03 DIAGNOSIS — R202 Paresthesia of skin: Secondary | ICD-10-CM

## 2020-09-03 DIAGNOSIS — J4520 Mild intermittent asthma, uncomplicated: Secondary | ICD-10-CM

## 2020-09-03 DIAGNOSIS — J329 Chronic sinusitis, unspecified: Secondary | ICD-10-CM | POA: Diagnosis not present

## 2020-09-03 MED ORDER — AMOXICILLIN 875 MG PO TABS: 875.0000 mg | ORAL_TABLET | Freq: Two times a day (BID) | ORAL | 0 refills | Status: DC

## 2020-09-03 NOTE — Telephone Encounter (Signed)
My chart message sent to pt for update.   

## 2020-09-03 NOTE — Progress Notes (Signed)
Patient ID: Angela Pacheco, female   DOB: [birth year], 54 y.o.   MRN: [id number]   Virtual Visit via video Note  This visit type was conducted due to national recommendations for restrictions regarding the COVID-19 pandemic (e.g. social distancing).  This format is felt to be most appropriate for this patient at this time.  All issues noted in this document were discussed and addressed.  No physical exam was performed (except for noted visual exam findings with Video Visits).   I connected with Angela Pacheco by a video enabled telemedicine application or telephone and verified that I am speaking with the correct person using two identifiers. Location patient: home Location provider: work  Persons participating in the virtual visit: patient, provider  The limitations, risks, security and privacy concerns of performing an evaluation and management service by video and the availability of in person appointments have been discussed. . It has also been discussed with the patient that there may be a patient responsible charge related to this service. The patient expressed understanding and agreed to proceed.   Reason for visit: work in appt  HPI: Reports has seen Dr Kathyrn Sheriff previously.  Diagnosed with Meniere's disease.  Reports noticing some intermittent positional vertigo.  Also reports has noticed if she goes from hot to cold temperatures, will notice "tingling".  States she was floating in her pool.  "Flopped over into the water".  States her arms and legs felt heavy.  She felt she drifted off to sleep for a few seconds.  States she came up out of the water and felt like she was having a panic attack.  Reported the incident scared her.  After that, she felt washed out.  Some increase fatigue.  No head spinning.  Was having some sinus congestion.  States ears feel more full.  (right more).  Frontal headache.  Taking a decongestant.  Using flonase and saline nasal spray.  Has noticed she is  using her inhaler more, but is having no trouble breathing.  No chest congestion or chest pain reported.  Some decreased appetite.  Pulse ox 97-98%.  Blood pressures averaging 105-110/70s.  Denies any known covid exposures.     ROS: See pertinent positives and negatives per HPI.  Past Medical History:  Diagnosis Date   Allergy    cats trigger   Asthma    mild   B12 deficiency 6/83/7290   Complication of anesthesia    Hypothyroidism    Thyroid disease     Past Surgical History:  Procedure Laterality Date   ABDOMINAL HYSTERECTOMY     cervix left in place   APPENDECTOMY  1991   CHOLECYSTECTOMY     COLONOSCOPY WITH PROPOFOL N/A 10/08/2019   Procedure: COLONOSCOPY WITH PROPOFOL;  Surgeon: Lin Landsman, MD;  Location: [facility] ENDOSCOPY;  Service: Gastroenterology;  Laterality: N/A;    Family History  Problem Relation Age of Onset   Arthritis Mother    Breast cancer Neg Hx     SOCIAL HX: reviewed.    Current Outpatient Medications:    cyanocobalamin (,VITAMIN B-12,) 1000 MCG/ML injection, Inject 1 mL (1,000 mcg total) into the muscle every 30 (thirty) days., Disp: 10 mL, Rfl: 0   fluticasone (FLONASE) 50 MCG/ACT nasal spray, Place 1 spray into both nostrils daily., Disp: 16 g, Rfl: 0   levothyroxine (SYNTHROID) 88 MCG tablet, TAKE 1 TABLET BY MOUTH DAILY, Disp: 90 tablet, Rfl: 1   SYRINGE-NEEDLE, DISP, 3 ML (BD INTEGRA SYRINGE) 25G X 1" 3 ML  MISC, Use as directed with B12 injections, Disp: 100 each, Rfl: 0   amoxicillin (AMOXIL) 875 MG tablet, Take 1 tablet (875 mg total) by mouth 2 (two) times daily., Disp: 20 tablet, Rfl: 0   Calcium 600-200 MG-UNIT tablet, Take 1 tablet by mouth 2 (two) times daily with a meal., Disp: , Rfl:   EXAM:  VITALS per patient if applicable: 69-67%  GENERAL: alert, oriented, appears well and in no acute distress  HEENT: atraumatic, conjunttiva clear, no obvious abnormalities on inspection of external nose and ears  NECK:  normal movements of the head and neck  LUNGS: on inspection no signs of respiratory distress, breathing rate appears normal, no obvious gross SOB, gasping or wheezing  CV: no obvious cyanosis  PSYCH/NEURO: pleasant and cooperative, no obvious depression or anxiety, speech and thought processing grossly intact  ASSESSMENT AND PLAN:  Discussed the following assessment and plan:  Sinusitis Symptoms appear to be c/w sinus infection.  Increased sinus pressure, congestion.  She reports feels similar.  Discussed covid and covid testing.  Continue flonase, saline nasal spray.  Mucinex.  Can add afrin nasal spray.  Will also cover with amoxicillin.  Rest fluids.  Discussed quarantine.  Follow closely.  Call with update.   Asthma Denies any trouble breathing.  No chest  Congestion.  Describes may be using her rescue inhaler a little more often. Pulse ox 97-98%.  Treat as outlined.  Follow closely.    Tingling in extremities States this has occurred previously when she goes from hot to cold.  No LOC with this incident.  No seizure activity reported.  No other neurological changes.  No tingling now.  Treat infection as outlined.  Follow.  Notify if recurrence.     Meds ordered this encounter  Medications   amoxicillin (AMOXIL) 875 MG tablet    Sig: Take 1 tablet (875 mg total) by mouth 2 (two) times daily.    Dispense:  20 tablet    Refill:  0     I discussed the assessment and treatment plan with the patient. The patient was provided an opportunity to ask questions and all were answered. The patient agreed with the plan and demonstrated an understanding of the instructions.   The patient was advised to call back or seek an in-person evaluation if the symptoms worsen or if the condition fails to improve as anticipated.   [name], MD

## 2020-09-12 ENCOUNTER — Telehealth: Payer: BC Managed Care – PPO | Admitting: Internal Medicine

## 2020-09-14 ENCOUNTER — Encounter: Payer: Self-pay | Admitting: Internal Medicine

## 2020-09-14 DIAGNOSIS — R202 Paresthesia of skin: Secondary | ICD-10-CM | POA: Insufficient documentation

## 2020-09-14 NOTE — Assessment & Plan Note (Signed)
States this has occurred previously when she goes from hot to cold.  No LOC with this incident.  No seizure activity reported.  No other neurological changes.  No tingling now.  Treat infection as outlined.  Follow.  Notify if recurrence.

## 2020-09-14 NOTE — Assessment & Plan Note (Signed)
Symptoms appear to be c/w sinus infection.  Increased sinus pressure, congestion.  She reports feels similar.  Discussed covid and covid testing.  Continue flonase, saline nasal spray.  Mucinex.  Can add afrin nasal spray.  Will also cover with amoxicillin.  Rest fluids.  Discussed quarantine.  Follow closely.  Call with update.

## 2020-09-14 NOTE — Assessment & Plan Note (Signed)
Denies any trouble breathing.  No chest  Congestion.  Describes may be using her rescue inhaler a little more often. Pulse ox 97-98%.  Treat as outlined.  Follow closely.

## 2020-09-18 ENCOUNTER — Encounter: Payer: BC Managed Care – PPO | Admitting: Internal Medicine

## 2020-09-19 ENCOUNTER — Other Ambulatory Visit: Payer: Self-pay

## 2020-09-19 ENCOUNTER — Encounter: Payer: Self-pay | Admitting: Internal Medicine

## 2020-09-19 ENCOUNTER — Ambulatory Visit (INDEPENDENT_AMBULATORY_CARE_PROVIDER_SITE_OTHER): Payer: BC Managed Care – PPO | Admitting: Internal Medicine

## 2020-09-19 ENCOUNTER — Other Ambulatory Visit: Payer: Self-pay | Admitting: Internal Medicine

## 2020-09-19 VITALS — BP 114/70 | HR 83 | Temp 97.7°F | Ht 60.0 in | Wt 138.2 lb

## 2020-09-19 DIAGNOSIS — G8929 Other chronic pain: Secondary | ICD-10-CM

## 2020-09-19 DIAGNOSIS — M67912 Unspecified disorder of synovium and tendon, left shoulder: Secondary | ICD-10-CM | POA: Diagnosis not present

## 2020-09-19 DIAGNOSIS — M25512 Pain in left shoulder: Secondary | ICD-10-CM

## 2020-09-19 NOTE — Telephone Encounter (Signed)
Okay to refill? Last TSH was in August 2020.

## 2020-09-19 NOTE — Patient Instructions (Addendum)
voltaren gel  4 x per day   No more than 2400 mg ibuprofen per day  Tylenol max 4000 total day   Shoulder Impingement Syndrome Rehab Ask your health care provider which exercises are safe for you. Do exercises exactly as told by your health care provider and adjust them as directed. It is normal to feel mild stretching, pulling, tightness, or discomfort as you do these exercises. Stop right away if you feel sudden pain or your pain gets worse. Do not begin these exercises until told by your health care provider. Stretching and range-of-motion exercise This exercise warms up your muscles and joints and improves the movement and flexibility of your shoulder. This exercise also helps to relieve pain and stiffness. Passive horizontal adduction In passive adduction, you use your other hand to move the injured arm toward your body. The injured arm does not move on its own. In this movement, your arm is moved across your body in the horizontal plane (horizontal adduction). 1. Sit or stand and pull your left / right elbow across your chest, toward your other shoulder. Stop when you feel a gentle stretch in the back of your shoulder and upper arm. ? Keep your arm at shoulder height. ? Keep your arm as close to your body as you comfortably can. 2. Hold for __________ seconds. 3. Slowly return to the starting position. Repeat __________ times. Complete this exercise __________ times a day. Strengthening exercises These exercises build strength and endurance in your shoulder. Endurance is the ability to use your muscles for a long time, even after they get tired. External rotation, isometric This is an exercise in which you press the back of your wrist against a door frame without moving your shoulder joint (isometric). 1. Stand or sit in a doorway, facing the door frame. 2. Bend your left / right elbow and place the back of your wrist against the door frame. Only the back of your wrist should be touching  the frame. Keep your upper arm at your side. 3. Gently press your wrist against the door frame, as if you are trying to push your arm away from your abdomen (external rotation). Press as hard as you are able without pain. ? Avoid shrugging your shoulder while you press your wrist against the door frame. Keep your shoulder blade tucked down toward the middle of your back. 4. Hold for __________ seconds. 5. Slowly release the tension, and relax your muscles completely before you repeat the exercise. Repeat __________ times. Complete this exercise __________ times a day. Internal rotation, isometric This is an exercise in which you press your palm against a door frame without moving your shoulder joint (isometric). 1. Stand or sit in a doorway, facing the door frame. 2. Bend your left / right elbow and place the palm of your hand against the door frame. Only your palm should be touching the frame. Keep your upper arm at your side. 3. Gently press your hand against the door frame, as if you are trying to push your arm toward your abdomen (internal rotation). Press as hard as you are able without pain. ? Avoid shrugging your shoulder while you press your hand against the door frame. Keep your shoulder blade tucked down toward the middle of your back. 4. Hold for __________ seconds. 5. Slowly release the tension, and relax your muscles completely before you repeat the exercise. Repeat __________ times. Complete this exercise __________ times a day. Scapular protraction, supine  1. Lie on your  back on a firm surface (supine position). Hold a __________ weight in your left / right hand. 2. Raise your left / right arm straight into the air so your hand is directly above your shoulder joint. 3. Push the weight into the air so your shoulder (scapula) lifts off the surface that you are lying on. The scapula will push up or forward (protraction). Do not move your head, neck, or back. 4. Hold for __________  seconds. 5. Slowly return to the starting position. Let your muscles relax completely before you repeat this exercise. Repeat __________ times. Complete this exercise __________ times a day. Scapular retraction  1. Sit in a stable chair without armrests, or stand up. 2. Secure an exercise band to a stable object in front of you so the band is at shoulder height. 3. Hold one end of the exercise band in each hand. Your palms should face down. 4. Squeeze your shoulder blades together (retraction) and move your elbows slightly behind you. Do not shrug your shoulders upward while you do this. 5. Hold for __________ seconds. 6. Slowly return to the starting position. Repeat __________ times. Complete this exercise __________ times a day. Shoulder extension  1. Sit in a stable chair without armrests, or stand up. 2. Secure an exercise band to a stable object in front of you so the band is above shoulder height. 3. Hold one end of the exercise band in each hand. 4. Straighten your elbows and lift your hands up to shoulder height. 5. Squeeze your shoulder blades together and pull your hands down to the sides of your thighs (extension). Stop when your hands are straight down by your sides. Do not let your hands go behind your body. 6. Hold for __________ seconds. 7. Slowly return to the starting position. Repeat __________ times. Complete this exercise __________ times a day. This information is not intended to replace advice given to you by your health care provider. Make sure you discuss any questions you have with your health care provider. Document Revised: 04/06/2019 Document Reviewed: 01/08/2019 Else[employer] Patient Education  [place].  Shoulder Range of Motion Exercises Shoulder range of motion (ROM) exercises are done to keep the shoulder moving freely or to increase movement. They are often recommended for people who have shoulder pain or stiffness or who are recovering from a  shoulder surgery. Phase 1 exercises When you are able, do this exercise 1-2 times per day for 30-60 seconds in each direction, or as directed by your health care provider. Pendulum exercise To do this exercise while sitting: 1. Sit in a chair or at the edge of your bed with your feet flat on the floor. 2. Let your affected arm hang down in front of you over the edge of the bed or chair. 3. Relax your shoulder, arm, and hand. [place] your body so your arm gently swings in small circles. You can also use your unaffected arm to start the motion. 5. Repeat changing the direction of the circles, swinging your arm left and right, and swinging your arm forward and back. To do this exercise while standing: 1. Stand next to a sturdy chair or table, and hold on to it with your hand on your unaffected side. 2. Bend forward at the waist. 3. Bend your knees slightly. 4. Relax your shoulder, arm, and hand. 5. While keeping your shoulder relaxed, use body motion to swing your arm in small circles. 6. Repeat changing the direction of the circles, swinging  your arm left and right, and swinging your arm forward and back. 7. Between exercises, stand up tall and take a short break to relax your lower back.  Phase 2 exercises Do these exercises 1-2 times per day or as told by your health care provider. Hold each stretch for 30 seconds, and repeat 3 times. Do the exercises with one or both arms as instructed by your health care provider. For these exercises, sit at a table with your hand and arm supported by the table. A chair that slides easily or has wheels can be helpful. External rotation 1. Turn your chair so that your affected side is nearest to the table. 2. Place your forearm on the table to your side. Bend your elbow about 90 at the elbow (right angle) and place your hand palm facing down on the table. Your elbow should be about 6 inches away from your side. 3. Keeping your arm on the table, lean your  body forward. Abduction 1. Turn your chair so that your affected side is nearest to the table. 2. Place your forearm and hand on the table so that your thumb points toward the ceiling and your arm is straight out to your side. 3. Slide your hand out to the side and away from you, using your unaffected arm to do the work. 4. To increase the stretch, you can slide your chair away from the table. Flexion: forward stretch 1. Sit facing the table. Place your hand and elbow on the table in front of you. 2. Slide your hand forward and away from you, using your unaffected arm to do the work. 3. To increase the stretch, you can slide your chair backward. Phase 3 exercises Do these exercises 1-2 times per day or as told by your health care provider. Hold each stretch for 30 seconds, and repeat 3 times. Do the exercises with one or both arms as instructed by your health care provider. Cross-body stretch: posterior capsule stretch 1. Lift your arm straight out in front of you. 2. Bend your arm 90 at the elbow (right angle) so your forearm moves across your body. 3. Use your other arm to gently pull the elbow across your body, toward your other shoulder. Wall climbs 1. Stand with your affected arm extended out to the side with your hand resting on a door frame. 2. Slide your hand slowly up the door frame. 3. To increase the stretch, step through the door frame. Keep your body upright and do not lean. Wand exercises You will need a cane, a piece of PVC pipe, or a sturdy wooden dowel for wand exercises. Flexion To do this exercise while standing: 1. Hold the wand with both of your hands, palms down. 2. Using the other arm to help, lift your arms up and over your head, if able. 3. Push upward with your other arm to gently increase the stretch. To do this exercise while lying down: 1. Lie on your back with your elbows resting on the floor and the wand in both your hands. Your hands will be palm down, or  pointing toward your feet. 2. Lift your hands toward the ceiling, using your unaffected arm to help if needed. 3. Bring your arms overhead as able, using your unaffected arm to help if needed. Internal rotation 1. Stand while holding the wand behind you with both hands. Your unaffected arm should be extended above your head with the arm of the affected side extended behind you at the  level of your waist. The wand should be pointing straight up and down as you hold it. 2. Slowly pull the wand up behind your back by straightening the elbow of your unaffected arm and bending the elbow of your affected arm. External rotation 1. Lie on your back with your affected upper arm supported on a small pillow or rolled towel. When you first do this exercise, keep your upper arm close to your body. Over time, bring your arm up to a 90 angle out to the side. 2. Hold the wand across your stomach and with both hands palm up. Your elbow on your affected side should be bent at a 90 angle. 3. Use your unaffected side to help push your forearm away from you and toward the floor. Keep your elbow on your affected side bent at a 90 angle. Contact a health care provider if you have:  New or increasing pain.  New numbness, tingling, weakness, or discoloration in your arm or hand. This information is not intended to replace advice given to you by your health care provider. Make sure you discuss any questions you have with your health care provider. Document Revised: 01/25/2018 Document Reviewed: 01/25/2018 Elsevier Patient Education  [place].  Shoulder Exercises Ask your health care provider which exercises are safe for you. Do exercises exactly as told by your health care provider and adjust them as directed. It is normal to feel mild stretching, pulling, tightness, or discomfort as you do these exercises. Stop right away if you feel sudden pain or your pain gets worse. Do not begin these exercises until  told by your health care provider. Stretching exercises External rotation and abduction This exercise is sometimes called corner stretch. This exercise rotates your arm outward (external rotation) and moves your arm out from your body (abduction). 1. Stand in a doorway with one of your feet slightly in front of the other. This is called a staggered stance. If you cannot reach your forearms to the door frame, stand facing a corner of a room. 2. Choose one of the following positions as told by your health care provider: ? Place your hands and forearms on the door frame above your head. ? Place your hands and forearms on the door frame at the height of your head. ? Place your hands on the door frame at the height of your elbows. 3. Slowly move your weight onto your front foot until you feel a stretch across your chest and in the front of your shoulders. Keep your head and chest upright and keep your abdominal muscles tight. 4. Hold for __________ seconds. 5. To release the stretch, shift your weight to your back foot. Repeat __________ times. Complete this exercise __________ times a day. Extension, standing 1. Stand and hold a broomstick, a cane, or a similar object behind your back. ? Your hands should be a little wider than shoulder width apart. ? Your palms should face away from your back. 2. Keeping your elbows straight and your shoulder muscles relaxed, move the stick away from your body until you feel a stretch in your shoulders (extension). ? Avoid shrugging your shoulders while you move the stick. Keep your shoulder blades tucked down toward the middle of your back. 3. Hold for __________ seconds. 4. Slowly return to the starting position. Repeat __________ times. Complete this exercise __________ times a day. Range-of-motion exercises Pendulum  1. Stand near a wall or a surface that you can hold onto for balance. 2.  Bend at the waist and let your left / right arm hang straight down.  Use your other arm to support you. Keep your back straight and do not lock your knees. 3. Relax your left / right arm and shoulder muscles, and move your hips and your trunk so your left / right arm swings freely. Your arm should swing because of the motion of your body, not because you are using your arm or shoulder muscles. 4. Keep moving your hips and trunk so your arm swings in the following directions, as told by your health care provider: ? Side to side. ? Forward and backward. ? In clockwise and counterclockwise circles. 5. Continue each motion for __________ seconds, or for as long as told by your health care provider. 6. Slowly return to the starting position. Repeat __________ times. Complete this exercise __________ times a day. Shoulder flexion, standing  1. Stand and hold a broomstick, a cane, or a similar object. Place your hands a little more than shoulder width apart on the object. Your left / right hand should be palm up, and your other hand should be palm down. 2. Keep your elbow straight and your shoulder muscles relaxed. Push the stick up with your healthy arm to raise your left / right arm in front of your body, and then over your head until you feel a stretch in your shoulder (flexion). ? Avoid shrugging your shoulder while you raise your arm. Keep your shoulder blade tucked down toward the middle of your back. 3. Hold for __________ seconds. 4. Slowly return to the starting position. Repeat __________ times. Complete this exercise __________ times a day. Shoulder abduction, standing 1. Stand and hold a broomstick, a cane, or a similar object. Place your hands a little more than shoulder width apart on the object. Your left / right hand should be palm up, and your other hand should be palm down. 2. Keep your elbow straight and your shoulder muscles relaxed. Push the object across your body toward your left / right side. Raise your left / right arm to the side of your body  (abduction) until you feel a stretch in your shoulder. ? Do not raise your arm above shoulder height unless your health care provider tells you to do that. ? If directed, raise your arm over your head. ? Avoid shrugging your shoulder while you raise your arm. Keep your shoulder blade tucked down toward the middle of your back. 3. Hold for __________ seconds. 4. Slowly return to the starting position. Repeat __________ times. Complete this exercise __________ times a day. Internal rotation  1. Place your left / right hand behind your back, palm up. 2. Use your other hand to dangle an exercise band, a towel, or a similar object over your shoulder. Grasp the band with your left / right hand so you are holding on to both ends. 3. Gently pull up on the band until you feel a stretch in the front of your left / right shoulder. The movement of your arm toward the center of your body is called internal rotation. ? Avoid shrugging your shoulder while you raise your arm. Keep your shoulder blade tucked down toward the middle of your back. 4. Hold for __________ seconds. 5. Release the stretch by letting go of the band and lowering your hands. Repeat __________ times. Complete this exercise __________ times a day. Strengthening exercises External rotation  1. Sit in a stable chair without armrests. 2. Secure an exercise band  to a stable object at elbow height on your left / right side. 3. Place a soft object, such as a folded towel or a small pillow, between your left / right upper arm and your body to move your elbow about 4 inches (10 cm) away from your side. 4. Hold the end of the exercise band so it is tight and there is no slack. 5. Keeping your elbow pressed against the soft object, slowly move your forearm out, away from your abdomen (external rotation). Keep your body steady so only your forearm moves. 6. Hold for __________ seconds. 7. Slowly return to the starting position. Repeat __________  times. Complete this exercise __________ times a day. Shoulder abduction  1. Sit in a stable chair without armrests, or stand up. 2. Hold a __________ weight in your left / right hand, or hold an exercise band with both hands. 3. Start with your arms straight down and your left / right palm facing in, toward your body. 4. Slowly lift your left / right hand out to your side (abduction). Do not lift your hand above shoulder height unless your health care provider tells you that this is safe. ? Keep your arms straight. ? Avoid shrugging your shoulder while you do this movement. Keep your shoulder blade tucked down toward the middle of your back. 5. Hold for __________ seconds. 6. Slowly lower your arm, and return to the starting position. Repeat __________ times. Complete this exercise __________ times a day. Shoulder extension 1. Sit in a stable chair without armrests, or stand up. 2. Secure an exercise band to a stable object in front of you so it is at shoulder height. 3. Hold one end of the exercise band in each hand. Your palms should face each other. 4. Straighten your elbows and lift your hands up to shoulder height. 5. Step back, away from the secured end of the exercise band, until the band is tight and there is no slack. 6. Squeeze your shoulder blades together as you pull your hands down to the sides of your thighs (extension). Stop when your hands are straight down by your sides. Do not let your hands go behind your body. 7. Hold for __________ seconds. 8. Slowly return to the starting position. Repeat __________ times. Complete this exercise __________ times a day. Shoulder row 1. Sit in a stable chair without armrests, or stand up. 2. Secure an exercise band to a stable object in front of you so it is at waist height. 3. Hold one end of the exercise band in each hand. Position your palms so that your thumbs are facing the ceiling (neutral position). 4. Bend each of your elbows to  a 90-degree angle (right angle) and keep your upper arms at your sides. 5. Step back until the band is tight and there is no slack. 6. Slowly pull your elbows back behind you. 7. Hold for __________ seconds. 8. Slowly return to the starting position. Repeat __________ times. Complete this exercise __________ times a day. Shoulder press-ups  1. Sit in a stable chair that has armrests. Sit upright, with your feet flat on the floor. 2. Put your hands on the armrests so your elbows are bent and your fingers are pointing forward. Your hands should be about even with the sides of your body. 3. Push down on the armrests and use your arms to lift yourself off the chair. Straighten your elbows and lift yourself up as much as you comfortably can. ? Move  your shoulder blades down, and avoid letting your shoulders move up toward your ears. ? Keep your feet on the ground. As you get stronger, your feet should support less of your body weight as you lift yourself up. 4. Hold for __________ seconds. 5. Slowly lower yourself back into the chair. Repeat __________ times. Complete this exercise __________ times a day. Wall push-ups  1. Stand so you are facing a stable wall. Your feet should be about one arm-length away from the wall. 2. Lean forward and place your palms on the wall at shoulder height. 3. Keep your feet flat on the floor as you bend your elbows and lean forward toward the wall. 4. Hold for __________ seconds. 5. Straighten your elbows to push yourself back to the starting position. Repeat __________ times. Complete this exercise __________ times a day. This information is not intended to replace advice given to you by your health care provider. Make sure you discuss any questions you have with your health care provider. Document Revised: 04/06/2019 Document Reviewed: 01/12/2019 Elsevier Patient Education  [place].
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
## 2020-09-19 NOTE — Telephone Encounter (Signed)
ok'd refill for synthroid.  She has physical scheduled with me beginning of October.  Will do labs then.

## 2020-09-22 DIAGNOSIS — M7542 Impingement syndrome of left shoulder: Secondary | ICD-10-CM | POA: Insufficient documentation

## 2020-10-01 ENCOUNTER — Ambulatory Visit (INDEPENDENT_AMBULATORY_CARE_PROVIDER_SITE_OTHER): Payer: BC Managed Care – PPO | Admitting: Internal Medicine

## 2020-10-01 ENCOUNTER — Other Ambulatory Visit: Payer: Self-pay

## 2020-10-01 VITALS — BP 110/72 | HR 81 | Temp 97.8°F | Resp 16 | Ht 60.0 in | Wt 135.0 lb

## 2020-10-01 DIAGNOSIS — Z9109 Other allergy status, other than to drugs and biological substances: Secondary | ICD-10-CM

## 2020-10-01 DIAGNOSIS — M25519 Pain in unspecified shoulder: Secondary | ICD-10-CM | POA: Diagnosis not present

## 2020-10-01 DIAGNOSIS — E039 Hypothyroidism, unspecified: Secondary | ICD-10-CM | POA: Diagnosis not present

## 2020-10-01 DIAGNOSIS — Z13220 Encounter for screening for lipoid disorders: Secondary | ICD-10-CM | POA: Diagnosis not present

## 2020-10-01 DIAGNOSIS — Z0000 Encounter for general adult medical examination without abnormal findings: Secondary | ICD-10-CM

## 2020-10-01 LAB — CBC WITH DIFFERENTIAL/PLATELET
Basophils Absolute: 0 10*3/uL (ref 0.0–0.1)
Basophils Relative: 0.2 % (ref 0.0–3.0)
Eosinophils Absolute: 0.2 10*3/uL (ref 0.0–0.7)
Eosinophils Relative: 1.4 % (ref 0.0–5.0)
HCT: 42.5 % (ref 36.0–46.0)
Hemoglobin: 14.2 g/dL (ref 12.0–15.0)
Lymphocytes Relative: 21.3 % (ref 12.0–46.0)
Lymphs Abs: 2.8 10*3/uL (ref 0.7–4.0)
MCHC: 33.3 g/dL (ref 30.0–36.0)
MCV: 94 fl (ref 78.0–100.0)
Monocytes Absolute: 1 10*3/uL (ref 0.1–1.0)
Monocytes Relative: 7.8 % (ref 3.0–12.0)
Neutro Abs: 9.1 10*3/uL — ABNORMAL HIGH (ref 1.4–7.7)
Neutrophils Relative %: 69.3 % (ref 43.0–77.0)
Platelets: 369 10*3/uL (ref 150.0–400.0)
RBC: 4.52 Mil/uL (ref 3.87–5.11)
RDW: 13.7 % (ref 11.5–15.5)
WBC: 13.2 10*3/uL — ABNORMAL HIGH (ref 4.0–10.5)

## 2020-10-01 LAB — LIPID PANEL
Cholesterol: 202 mg/dL — ABNORMAL HIGH (ref 0–200)
HDL: 62.2 mg/dL (ref >39.00)
LDL Cholesterol: 123 mg/dL — ABNORMAL HIGH (ref 0–99)
NonHDL: 139.82
Total CHOL/HDL Ratio: 3
Triglycerides: 82 mg/dL (ref 0.0–149.0)
VLDL: 16.4 mg/dL (ref 0.0–40.0)

## 2020-10-01 LAB — COMPREHENSIVE METABOLIC PANEL
ALT: 21 U/L (ref 0–35)
AST: 14 U/L (ref 0–37)
Albumin: 4.5 g/dL (ref 3.5–5.2)
Alkaline Phosphatase: 57 U/L (ref 39–117)
BUN: 23 mg/dL (ref 6–23)
CO2: 28 mEq/L (ref 19–32)
Calcium: 9.5 mg/dL (ref 8.4–10.5)
Chloride: 104 mEq/L (ref 96–112)
Creatinine, Ser: 0.86 mg/dL (ref 0.40–1.20)
GFR: 77.37 mL/min (ref >60.00)
Glucose, Bld: 82 mg/dL (ref 70–99)
Potassium: 4.3 mEq/L (ref 3.5–5.1)
Sodium: 139 mEq/L (ref 135–145)
Total Bilirubin: 0.6 mg/dL (ref 0.2–1.2)
Total Protein: 7 g/dL (ref 6.0–8.3)

## 2020-10-01 LAB — TSH: TSH: 11.1 u[IU]/mL — ABNORMAL HIGH (ref 0.35–4.50)

## 2020-10-01 NOTE — Assessment & Plan Note (Signed)
Physical today 10/01/20.  PAP 09/29/18 - negative with negative HPV.  Mammogram 06/27/20 - Birads I.  Colonoscopy 10/08/19 - one polyp.

## 2020-10-01 NOTE — Progress Notes (Signed)
Patient ID: Angela Pacheco, female   DOB: 01-04-1966, 54 y.o.   MRN: [id number]   Subjective:    Patient ID: Angela Pacheco, female    DOB: April 21, 1966, 54 y.o.   MRN: [id number]  HPI This visit occurred during the SARS-CoV-2 public health emergency.  Safety protocols were in place, including screening questions prior to the visit, additional usage of staff PPE, and extensive cleaning of exam room while observing appropriate contact time as indicated for disinfecting solutions.  Patient here for her physical exam.  She reports she is feeling relatively well.  Has been having problems with her left shoulder.  Has seen Dr Sabra Heck.  S/p injection x 2.  Has f/u Friday.  Meloxicam.  Handling stress.  Tries to stay active.  No chest pain or sob reported.  No abdominal pain or bowel change reported.    Past Medical History:  Diagnosis Date  . Allergy    cats trigger  . Asthma    mild  . B12 deficiency 05/06/2019  . Complication of anesthesia   . Hypothyroidism   . Thyroid disease    Past Surgical History:  Procedure Laterality Date  . ABDOMINAL HYSTERECTOMY     cervix left in place  . APPENDECTOMY  1991  . CHOLECYSTECTOMY    . COLONOSCOPY WITH PROPOFOL N/A 10/08/2019   Procedure: COLONOSCOPY WITH PROPOFOL;  Surgeon: Lin Landsman, MD;  Location: [facility] ENDOSCOPY;  Service: Gastroenterology;  Laterality: N/A;   Family History  Problem Relation Age of Onset  . Arthritis Mother   . Breast cancer Neg Hx    Social History   Socioeconomic History  . Marital status: Married    Spouse name: Not on file  . Number of children: Not on file  . Years of education: Not on file  . Highest education level: Not on file  Occupational History  . Not on file  Tobacco Use  . Smoking status: Never Smoker  . Smokeless tobacco: Never Used  Substance and Sexual Activity  . Alcohol use: Yes    Alcohol/week: 0.0 standard drinks    Comment: occ.  . Drug use: No  . Sexual activity: Not  on file  Other Topics Concern  . Not on file  Social History Narrative  . Not on file   Social Determinants of Health   Financial Resource Strain:   . Difficulty of Paying Living Expenses: Not on file  Food Insecurity:   . Worried About Charity fundraiser in the Last Year: Not on file  . Ran Out of Food in the Last Year: Not on file  Transportation Needs:   . Lack of Transportation (Medical): Not on file  . Lack of Transportation (Non-Medical): Not on file  Physical Activity:   . Days of Exercise per Week: Not on file  . Minutes of Exercise per Session: Not on file  Stress:   . Feeling of Stress : Not on file  Social Connections:   . Frequency of Communication with Friends and Family: Not on file  . Frequency of Social Gatherings with Friends and Family: Not on file  . Attends Religious Services: Not on file  . Active Member of Clubs or Organizations: Not on file  . Attends Archivist Meetings: Not on file  . Marital Status: Not on file    Outpatient Encounter Medications as of 10/01/2020  Medication Sig  . amoxicillin (AMOXIL) 875 MG tablet Take 1 tablet (875 mg total) by mouth 2 (two)  times daily.  . Calcium 600-200 MG-UNIT tablet Take 1 tablet by mouth 2 (two) times daily with a meal.  . cyanocobalamin (,VITAMIN B-12,) 1000 MCG/ML injection Inject 1 mL (1,000 mcg total) into the muscle every 30 (thirty) days.  . SYRINGE-NEEDLE, DISP, 3 ML (BD INTEGRA SYRINGE) 25G X 1" 3 ML MISC Use as directed with B12 injections  . Triamcinolone Acetonide (NASACORT AQ NA) Place into the nose.  . [DISCONTINUED] levothyroxine (SYNTHROID) 88 MCG tablet TAKE 1 TABLET BY MOUTH DAILY   No facility-administered encounter medications on file as of 10/01/2020.    Review of Systems  Constitutional: Negative for appetite change and unexpected weight change.  HENT: Negative for congestion, sinus pressure and sore throat.   Eyes: Negative for pain and visual disturbance.  Respiratory:  Negative for chest tightness, shortness of breath and stridor.   Cardiovascular: Negative for chest pain, palpitations and leg swelling.  Gastrointestinal: Negative for abdominal pain, diarrhea, nausea and vomiting.  Genitourinary: Negative for difficulty urinating and dysuria.  Musculoskeletal: Negative for joint swelling and myalgias.       Left shoulder pain as outlined.   Skin: Negative for color change and rash.  Neurological: Negative for dizziness, light-headedness and headaches.  Hematological: Negative for adenopathy. Does not bruise/bleed easily.  Psychiatric/Behavioral: Negative for decreased concentration and dysphoric mood.       Objective:    Physical Exam Vitals reviewed.  Constitutional:      General: She is not in acute distress.    Appearance: Normal appearance. She is well-developed.  HENT:     Head: Normocephalic and atraumatic.     Right Ear: External ear normal.     Left Ear: External ear normal.  Eyes:     General: No scleral icterus.       Right eye: No discharge.        Left eye: No discharge.     Conjunctiva/sclera: Conjunctivae normal.  Neck:     Thyroid: No thyromegaly.  Cardiovascular:     Rate and Rhythm: Normal rate and regular rhythm.  Pulmonary:     Effort: No tachypnea, accessory muscle usage or respiratory distress.     Breath sounds: Normal breath sounds. No decreased breath sounds or wheezing.  Chest:     Breasts:        Right: No inverted nipple, mass, nipple discharge or tenderness (no axillary adenopathy).        Left: No inverted nipple, mass, nipple discharge or tenderness (no axilarry adenopathy).  Abdominal:     General: Bowel sounds are normal.     Palpations: Abdomen is soft.     Tenderness: There is no abdominal tenderness.  Musculoskeletal:        General: No swelling or tenderness.     Cervical back: Neck supple. No tenderness.  Lymphadenopathy:     Cervical: No cervical adenopathy.  Skin:    Findings: No erythema or  rash.  Neurological:     Mental Status: She is alert and oriented to person, place, and time.  Psychiatric:        Mood and Affect: Mood normal.        Behavior: Behavior normal.     BP 110/72   Pulse 81   Temp 97.8 F (36.6 C) (Oral)   Resp 16   Wt 135 lb (61.2 kg)   SpO2 98%   BMI 26.37 kg/m  Wt Readings from Last 3 Encounters:  10/01/20 135 lb (61.2 kg)  09/19/20 138 lb  3.2 oz (62.7 kg)  09/03/20 138 lb (62.6 kg)     Lab Results  Component Value Date   WBC 10.9 (H) 10/10/2020   HGB 14.4 10/10/2020   HCT 42.8 10/10/2020   PLT 318.0 10/10/2020   GLUCOSE 82 10/01/2020   CHOL 202 (H) 10/01/2020   TRIG 82.0 10/01/2020   HDL 62.20 10/01/2020   LDLCALC 123 (H) 10/01/2020   ALT 21 10/01/2020   AST 14 10/01/2020   NA 139 10/01/2020   K 4.3 10/01/2020   CL 104 10/01/2020   CREATININE 0.86 10/01/2020   BUN 23 10/01/2020   CO2 28 10/01/2020   TSH 11.10 (H) 10/01/2020       Assessment & Plan:   Problem List Items Addressed This Visit    Shoulder pain    Persistent left shoulder pain as outlined.  Has seen ortho.  S/p injection x 2.  Due f/u Friday.        Hypothyroidism - Primary    On thyroid replacement.  Follow tsh.       Relevant Orders   Comprehensive metabolic panel (Completed)   TSH (Completed)   CBC with Differential/Platelet (Completed)   Health care maintenance    Physical today 10/01/20.  PAP 09/29/18 - negative with negative HPV.  Mammogram 06/27/20 - Birads I.  Colonoscopy 10/08/19 - one polyp.        Environmental allergies    Controlled.       Other Visit Diagnoses    Screening cholesterol level       Relevant Orders   Lipid panel (Completed)       [name], MD

## 2020-10-02 ENCOUNTER — Encounter: Payer: Self-pay | Admitting: Internal Medicine

## 2020-10-03 ENCOUNTER — Other Ambulatory Visit: Payer: Self-pay

## 2020-10-03 MED ORDER — LEVOTHYROXINE SODIUM 100 MCG PO TABS
100.0000 ug | ORAL_TABLET | Freq: Every day | ORAL | 1 refills | Status: DC
Start: 2020-10-03 — End: 2020-11-24

## 2020-10-09 ENCOUNTER — Telehealth: Payer: Self-pay | Admitting: *Deleted

## 2020-10-09 DIAGNOSIS — D72829 Elevated white blood cell count, unspecified: Secondary | ICD-10-CM

## 2020-10-09 NOTE — Telephone Encounter (Signed)
Please place future orders for lab appt.  

## 2020-10-10 ENCOUNTER — Other Ambulatory Visit: Payer: BC Managed Care – PPO

## 2020-10-10 ENCOUNTER — Other Ambulatory Visit (INDEPENDENT_AMBULATORY_CARE_PROVIDER_SITE_OTHER): Payer: BC Managed Care – PPO

## 2020-10-10 ENCOUNTER — Other Ambulatory Visit: Payer: Self-pay

## 2020-10-10 DIAGNOSIS — D72829 Elevated white blood cell count, unspecified: Secondary | ICD-10-CM | POA: Diagnosis not present

## 2020-10-10 LAB — CBC WITH DIFFERENTIAL/PLATELET
Basophils Absolute: 0.1 10*3/uL (ref 0.0–0.1)
Basophils Relative: 0.5 % (ref 0.0–3.0)
Eosinophils Absolute: 0.2 10*3/uL (ref 0.0–0.7)
Eosinophils Relative: 2 % (ref 0.0–5.0)
HCT: 42.8 % (ref 36.0–46.0)
Hemoglobin: 14.4 g/dL (ref 12.0–15.0)
Lymphocytes Relative: 25.8 % (ref 12.0–46.0)
Lymphs Abs: 2.8 10*3/uL (ref 0.7–4.0)
MCHC: 33.8 g/dL (ref 30.0–36.0)
MCV: 94.7 fl (ref 78.0–100.0)
Monocytes Absolute: 1 10*3/uL (ref 0.1–1.0)
Monocytes Relative: 9.6 % (ref 3.0–12.0)
Neutro Abs: 6.8 10*3/uL (ref 1.4–7.7)
Neutrophils Relative %: 62.1 % (ref 43.0–77.0)
Platelets: 318 10*3/uL (ref 150.0–400.0)
RBC: 4.52 Mil/uL (ref 3.87–5.11)
RDW: 14.1 % (ref 11.5–15.5)
WBC: 10.9 10*3/uL — ABNORMAL HIGH (ref 4.0–10.5)

## 2020-10-10 NOTE — Telephone Encounter (Signed)
Order placed for f/u cbc.   

## 2020-10-12 ENCOUNTER — Encounter: Payer: Self-pay | Admitting: Internal Medicine

## 2020-10-12 NOTE — Assessment & Plan Note (Signed)
Persistent left shoulder pain as outlined.  Has seen ortho.  S/p injection x 2.  Due f/u Friday.

## 2020-10-12 NOTE — Assessment & Plan Note (Signed)
Controlled.  

## 2020-10-12 NOTE — Assessment & Plan Note (Signed)
On thyroid replacement.  Follow tsh.  

## 2020-10-14 ENCOUNTER — Telehealth: Payer: Self-pay | Admitting: Internal Medicine

## 2020-10-14 ENCOUNTER — Encounter: Payer: Self-pay | Admitting: Internal Medicine

## 2020-10-14 NOTE — Telephone Encounter (Signed)
Patient coming in at 1:00 tomorrow for TB skin test and dropping off form while she is here.

## 2020-10-14 NOTE — Telephone Encounter (Signed)
Patient called in she has a new job and need a TB test and a form filled out for job please call

## 2020-10-14 NOTE — Telephone Encounter (Signed)
See phone note

## 2020-10-15 ENCOUNTER — Other Ambulatory Visit: Payer: Self-pay

## 2020-10-15 ENCOUNTER — Ambulatory Visit (INDEPENDENT_AMBULATORY_CARE_PROVIDER_SITE_OTHER): Payer: BC Managed Care – PPO

## 2020-10-15 DIAGNOSIS — Z111 Encounter for screening for respiratory tuberculosis: Secondary | ICD-10-CM | POA: Diagnosis not present

## 2020-10-15 NOTE — Progress Notes (Signed)
Pt came in today for PPD test to be placed for new job. PPD placed in right arm. She dropped off a form that will be completed for her to pick up on Friday 10/22 when she comes in to have PPD read. Per Juliann Pulse, patient is coming in on Friday 10/22 after 2 pm for PPD read.

## 2020-10-17 ENCOUNTER — Telehealth: Payer: Self-pay | Admitting: Internal Medicine

## 2020-10-17 NOTE — Telephone Encounter (Signed)
Patient did not schedule a follow up appointment to have tb read Kerin Salen gave permission for it to be read on today 10-17-20 Friday.

## 2020-11-17 ENCOUNTER — Telehealth: Payer: Self-pay | Admitting: *Deleted

## 2020-11-17 DIAGNOSIS — E039 Hypothyroidism, unspecified: Secondary | ICD-10-CM

## 2020-11-17 NOTE — Telephone Encounter (Signed)
Order placed for tsh lab.

## 2020-11-17 NOTE — Telephone Encounter (Signed)
Please place future orders for lab appt.  

## 2020-11-19 ENCOUNTER — Other Ambulatory Visit: Payer: Self-pay

## 2020-11-19 ENCOUNTER — Other Ambulatory Visit (INDEPENDENT_AMBULATORY_CARE_PROVIDER_SITE_OTHER): Payer: BC Managed Care – PPO

## 2020-11-19 DIAGNOSIS — E039 Hypothyroidism, unspecified: Secondary | ICD-10-CM

## 2020-11-19 LAB — TSH: TSH: 4.03 u[IU]/mL (ref 0.35–4.50)

## 2020-11-21 ENCOUNTER — Encounter: Payer: Self-pay | Admitting: Internal Medicine

## 2020-11-24 ENCOUNTER — Other Ambulatory Visit: Payer: Self-pay

## 2020-11-24 MED ORDER — LEVOTHYROXINE SODIUM 100 MCG PO TABS: 100.0000 ug | ORAL_TABLET | Freq: Every day | ORAL | 5 refills | Status: DC

## 2020-12-03 ENCOUNTER — Encounter: Payer: Self-pay | Admitting: Internal Medicine

## 2020-12-03 ENCOUNTER — Telehealth: Payer: Self-pay

## 2020-12-03 NOTE — Telephone Encounter (Signed)
Called and offered a video visit to [facility] for Wednesday 12/10/2020 to be seen by Dr. Nicki Reaper. This is the only available slot as of 4:10pm on 12/03/2020. She declines the 12/10/2020 appointment and states that it does not work with her schedule because she works. Darcy states that she needs to talk to Larena Glassman because she works very closely with her and Larena Glassman knows all of her medical history. Mersadie wants a sooner appointment with Dr. Nicki Reaper and wants Larena Glassman to call back with the other availabilities.  See note marked Joint Pain on patients chart started on 12/03/2020.

## 2020-12-03 NOTE — Telephone Encounter (Signed)
Called and offered a video visit to [facility] for Wednesday 12/10/2020 to be seen by Dr. Nicki Reaper. This is the only available slot as of 4:10pm on 12/03/2020. She declines the 12/10/2020 appointment and states that it does not work with her schedule because she works. Angela Pacheco states that she needs to talk to Angela Pacheco because she works very closely with her and Angela Pacheco knows all of her medical history. Angela Pacheco wants a sooner appointment with Dr. Nicki Reaper and wants Angela Pacheco to call back with the other availabilities.

## 2020-12-09 ENCOUNTER — Encounter: Payer: Self-pay | Admitting: Internal Medicine

## 2020-12-10 ENCOUNTER — Telehealth: Payer: BC Managed Care – PPO | Admitting: Internal Medicine

## 2020-12-11 ENCOUNTER — Telehealth (INDEPENDENT_AMBULATORY_CARE_PROVIDER_SITE_OTHER): Payer: BC Managed Care – PPO | Admitting: Internal Medicine

## 2020-12-11 DIAGNOSIS — J4520 Mild intermittent asthma, uncomplicated: Secondary | ICD-10-CM | POA: Diagnosis not present

## 2020-12-11 DIAGNOSIS — M25519 Pain in unspecified shoulder: Secondary | ICD-10-CM

## 2020-12-11 DIAGNOSIS — E039 Hypothyroidism, unspecified: Secondary | ICD-10-CM | POA: Diagnosis not present

## 2020-12-11 DIAGNOSIS — Z9109 Other allergy status, other than to drugs and biological substances: Secondary | ICD-10-CM

## 2020-12-11 DIAGNOSIS — M2550 Pain in unspecified joint: Secondary | ICD-10-CM

## 2020-12-11 NOTE — Progress Notes (Signed)
Patient ID: Angela Pacheco, female   DOB: 1966-05-07, 54 y.o.   MRN: [id number]   Virtual Visit via video Note  This visit type was conducted due to national recommendations for restrictions regarding the COVID-19 pandemic (e.g. social distancing).  This format is felt to be most appropriate for this patient at this time.  All issues noted in this document were discussed and addressed.  No physical exam was performed (except for noted visual exam findings with Video Visits).   I connected with [name] by a video enabled telemedicine application and verified that I am speaking with the correct person using two identifiers. Location patient: home Location provider: work Persons participating in the virtual visit: patient, provider  The limitations, risks, security and privacy concerns of performing an evaluation and management service by video and the availability of in person appointments have been discussed. It has also been discussed with the patient that there may be a patient responsible charge related to this service. The patient expressed understanding and agreed to proceed.   Reason for visit: work in appt  HPI: Work in appt to discuss joint pain.  Reports that over the last few weeks, she has had problems with increased joint pain.  This involves her feet, ankles, knees, hips, back and hands.  Also reports feeling cold.  Denies any fever.  No rash.  Taking aleve.  Has had some mild sinus symptoms with the weather change, but no acute infectious symptoms.  Staying on allergy medication and nasal spray. Has seen ortho for her shoulder.  Going to PT.  Has had dry needling.  Felt bad after this.  Was not sure if this could be related to above. Was placed on prednisone taper recently.  Joints improved and she felt good while on this medication. Has had negative covid test.  No sob, increased cough or congestion noted.     ROS: See pertinent positives and negatives per  HPI.  Past Medical History:  Diagnosis Date  . Allergy    cats trigger  . Asthma    mild  . B12 deficiency 05/06/2019  . Complication of anesthesia   . Hypothyroidism   . Thyroid disease     Past Surgical History:  Procedure Laterality Date  . ABDOMINAL HYSTERECTOMY     cervix left in place  . APPENDECTOMY  1991  . CHOLECYSTECTOMY    . COLONOSCOPY WITH PROPOFOL N/A 10/08/2019   Procedure: COLONOSCOPY WITH PROPOFOL;  Surgeon: Lin Landsman, MD;  Location: [facility] ENDOSCOPY;  Service: Gastroenterology;  Laterality: N/A;    Family History  Problem Relation Age of Onset  . Arthritis Mother   . Breast cancer Neg Hx     SOCIAL HX: reviewed.    Current Outpatient Medications:  .  Calcium 600-200 MG-UNIT tablet, Take 1 tablet by mouth 2 (two) times daily with a meal., Disp: , Rfl:  .  cyanocobalamin (,VITAMIN B-12,) 1000 MCG/ML injection, Inject 1 mL (1,000 mcg total) into the muscle every 30 (thirty) days., Disp: 10 mL, Rfl: 0 .  levothyroxine (SYNTHROID) 100 MCG tablet, Take 1 tablet (100 mcg total) by mouth daily., Disp: 30 tablet, Rfl: 5 .  SYRINGE-NEEDLE, DISP, 3 ML (BD INTEGRA SYRINGE) 25G X 1" 3 ML MISC, Use as directed with B12 injections, Disp: 100 each, Rfl: 0 .  Triamcinolone Acetonide (NASACORT AQ NA), Place into the nose., Disp: , Rfl:   EXAM:  GENERAL: alert, oriented, appears well and in no acute distress  HEENT: atraumatic,  conjunttiva clear, no obvious abnormalities on inspection of external nose and ears  NECK: normal movements of the head and neck  LUNGS: on inspection no signs of respiratory distress, breathing rate appears normal, no obvious gross SOB, gasping or wheezing  CV: no obvious cyanosis  PSYCH/NEURO: pleasant and cooperative, no obvious depression or anxiety, speech and thought processing grossly intact  ASSESSMENT AND PLAN:  Discussed the following assessment and plan:  Problem List Items Addressed This Visit    Hypothyroidism     On thyroid replacement.  Follow tsh.        Relevant Orders   TSH   Environmental allergies    Taking her allergy medication and using her nasal spray.  Controlled.       Asthma    Breathing stable. No sob.        Joint pain    Increased joint pain as outlined.  Unclear etiology.  No fever.  No rash.  Improved with prednisone.  Check esr and crp.  Also check routine labs, including cbc and tsh.  Further w/up pending results.       Relevant Orders   CBC with Differential/Platelet   Comprehensive metabolic panel   Sedimentation rate   C-reactive protein   VITAMIN D 25 Hydroxy (Vit-D Deficiency, Fractures)   Shoulder pain    Seeing ortho. Has been to PT.  S/p dry needling.  Prednisone taper helped.  Continue f/u with PT.           I discussed the assessment and treatment plan with the patient. The patient was provided an opportunity to ask questions and all were answered. The patient agreed with the plan and demonstrated an understanding of the instructions.   The patient was advised to call back or seek an in-person evaluation if the symptoms worsen or if the condition fails to improve as anticipated.    [name], MD

## 2020-12-15 ENCOUNTER — Telehealth: Payer: Self-pay | Admitting: *Deleted

## 2020-12-15 NOTE — Telephone Encounter (Signed)
Please place future orders for lab appt.  

## 2020-12-16 ENCOUNTER — Encounter: Payer: Self-pay | Admitting: Internal Medicine

## 2020-12-16 NOTE — Assessment & Plan Note (Signed)
Seeing ortho. Has been to PT.  S/p dry needling.  Prednisone taper helped.  Continue f/u with PT.

## 2020-12-16 NOTE — Assessment & Plan Note (Signed)
Increased joint pain as outlined.  Unclear etiology.  No fever.  No rash.  Improved with prednisone.  Check esr and crp.  Also check routine labs, including cbc and tsh.  Further w/up pending results.

## 2020-12-16 NOTE — Assessment & Plan Note (Signed)
Taking her allergy medication and using her nasal spray.  Controlled.

## 2020-12-16 NOTE — Telephone Encounter (Signed)
Orders placed for labs

## 2020-12-16 NOTE — Assessment & Plan Note (Signed)
On thyroid replacement.  Follow tsh.  

## 2020-12-16 NOTE — Assessment & Plan Note (Signed)
Breathing stable.  No sob.   

## 2020-12-17 ENCOUNTER — Other Ambulatory Visit: Payer: Self-pay | Admitting: Internal Medicine

## 2020-12-18 ENCOUNTER — Other Ambulatory Visit: Payer: Self-pay

## 2020-12-18 ENCOUNTER — Other Ambulatory Visit (INDEPENDENT_AMBULATORY_CARE_PROVIDER_SITE_OTHER): Payer: BC Managed Care – PPO

## 2020-12-18 DIAGNOSIS — E039 Hypothyroidism, unspecified: Secondary | ICD-10-CM | POA: Diagnosis not present

## 2020-12-18 DIAGNOSIS — M2550 Pain in unspecified joint: Secondary | ICD-10-CM

## 2020-12-18 LAB — COMPREHENSIVE METABOLIC PANEL
ALT: 34 U/L (ref 0–35)
AST: 20 U/L (ref 0–37)
Albumin: 3.9 g/dL (ref 3.5–5.2)
Alkaline Phosphatase: 54 U/L (ref 39–117)
BUN: 13 mg/dL (ref 6–23)
CO2: 29 mEq/L (ref 19–32)
Calcium: 9.4 mg/dL (ref 8.4–10.5)
Chloride: 108 mEq/L (ref 96–112)
Creatinine, Ser: 0.8 mg/dL (ref 0.40–1.20)
GFR: 84.51 mL/min (ref >60.00)
Glucose, Bld: 76 mg/dL (ref 70–99)
Potassium: 4.2 mEq/L (ref 3.5–5.1)
Sodium: 143 mEq/L (ref 135–145)
Total Bilirubin: 0.5 mg/dL (ref 0.2–1.2)
Total Protein: 6.3 g/dL (ref 6.0–8.3)

## 2020-12-18 LAB — CBC WITH DIFFERENTIAL/PLATELET
Basophils Absolute: 0.1 10*3/uL (ref 0.0–0.1)
Basophils Relative: 0.8 % (ref 0.0–3.0)
Eosinophils Absolute: 0.3 10*3/uL (ref 0.0–0.7)
Eosinophils Relative: 3.5 % (ref 0.0–5.0)
HCT: 42 % (ref 36.0–46.0)
Hemoglobin: 13.9 g/dL (ref 12.0–15.0)
Lymphocytes Relative: 27.7 % (ref 12.0–46.0)
Lymphs Abs: 2 10*3/uL (ref 0.7–4.0)
MCHC: 33 g/dL (ref 30.0–36.0)
MCV: 93.8 fl (ref 78.0–100.0)
Monocytes Absolute: 0.7 10*3/uL (ref 0.1–1.0)
Monocytes Relative: 10 % (ref 3.0–12.0)
Neutro Abs: 4.2 10*3/uL (ref 1.4–7.7)
Neutrophils Relative %: 58 % (ref 43.0–77.0)
Platelets: 307 10*3/uL (ref 150.0–400.0)
RBC: 4.48 Mil/uL (ref 3.87–5.11)
RDW: 13.5 % (ref 11.5–15.5)
WBC: 7.3 10*3/uL (ref 4.0–10.5)

## 2020-12-18 LAB — TSH: TSH: 0.78 u[IU]/mL (ref 0.35–4.50)

## 2020-12-18 LAB — SEDIMENTATION RATE: Sed Rate: 17 mm/hr (ref 0–30)

## 2020-12-18 LAB — VITAMIN D 25 HYDROXY (VIT D DEFICIENCY, FRACTURES): VITD: 32.57 ng/mL (ref 30.00–100.00)

## 2020-12-18 LAB — C-REACTIVE PROTEIN: CRP: <1 mg/dL (ref 0.5–20.0)

## 2020-12-21 ENCOUNTER — Encounter: Payer: Self-pay | Admitting: Internal Medicine

## 2020-12-26 ENCOUNTER — Telehealth: Payer: BC Managed Care – PPO | Admitting: Family

## 2020-12-26 DIAGNOSIS — R300 Dysuria: Secondary | ICD-10-CM | POA: Diagnosis not present

## 2020-12-26 MED ORDER — NITROFURANTOIN MONOHYD MACRO 100 MG PO CAPS: 100.0000 mg | ORAL_CAPSULE | Freq: Two times a day (BID) | ORAL | 0 refills | Status: DC

## 2020-12-26 MED ORDER — FLUCONAZOLE 150 MG PO TABS: 150.0000 mg | ORAL_TABLET | Freq: Once | ORAL | 0 refills | Status: AC

## 2020-12-26 NOTE — Progress Notes (Signed)

## 2021-01-07 ENCOUNTER — Encounter: Payer: Self-pay | Admitting: Internal Medicine

## 2021-01-07 NOTE — Telephone Encounter (Signed)
Pt called to report that her foot is doing better.. she said that she would call [facility]

## 2021-01-07 NOTE — Telephone Encounter (Signed)
Do you want to see her or are you ok with me sending her to ortho?

## 2021-01-07 NOTE — Telephone Encounter (Signed)
Needs to be evaluated.  I would recommend [facility] - would facilitate being seen.  Let me know if needs anything more.

## 2021-01-07 NOTE — Telephone Encounter (Signed)
LMTCB

## 2021-01-08 NOTE — Telephone Encounter (Signed)
Noted  

## 2021-01-17 ENCOUNTER — Telehealth: Payer: Self-pay | Admitting: Emergency Medicine

## 2021-01-17 DIAGNOSIS — R300 Dysuria: Secondary | ICD-10-CM

## 2021-01-17 MED ORDER — FLUCONAZOLE 150 MG PO TABS
ORAL_TABLET | ORAL | 0 refills | Status: DC
Start: 2021-01-17 — End: 2021-04-01

## 2021-01-17 MED ORDER — CEPHALEXIN 500 MG PO CAPS
500.0000 mg | ORAL_CAPSULE | Freq: Two times a day (BID) | ORAL | 0 refills | Status: DC
Start: 2021-01-17 — End: 2021-02-13

## 2021-01-17 NOTE — Progress Notes (Signed)
We are sorry that you are not feeling well.  Here is how we plan to help!  Yeah, let's try something different.  I like Keflex better than macrobid.  I've sent that and another dose of diflucan.  Based on what you shared with me it looks like you most likely have a simple urinary tract infection.  A UTI (Urinary Tract Infection) is a bacterial infection of the bladder.  Most cases of urinary tract infections are simple to treat but a key part of your care is to encourage you to drink plenty of fluids and watch your symptoms carefully.  I have prescribed Keflex 500 mg twice a day for 7 days.  Your symptoms should gradually improve. Call us if the burning in your urine worsens, you develop worsening fever, back pain or pelvic pain or if your symptoms do not resolve after completing the antibiotic.  Urinary tract infections can be prevented by drinking plenty of water to keep your body hydrated.  Also be sure when you wipe, wipe from front to back and don't hold it in!  If possible, empty your bladder every 4 hours.  Your e-visit answers were reviewed by a board certified advanced clinical practitioner to complete your personal care plan.  Depending on the condition, your plan could have included both over the counter or prescription medications.  If there is a problem please reply  once you have received a response from your provider.  Your safety is important to [place].  If you have drug allergies check your prescription carefully.    You can use MyChart to ask questions about today's visit, request a non-urgent call back, or ask for a work or school excuse for 24 hours related to this e-Visit. If it has been greater than 24 hours you will need to follow up with your provider, or enter a new e-Visit to address those concerns.   You will get an e-mail in the next two days asking about your experience.  I hope that your e-visit has been valuable and will speed your recovery. Thank you for using  e-visits.   Approximately 5 minutes was used in reviewing the patient's chart, questionnaire, prescribing medications, and documentation.

## 2021-02-13 ENCOUNTER — Encounter: Payer: Self-pay | Admitting: Internal Medicine

## 2021-02-13 ENCOUNTER — Other Ambulatory Visit: Payer: Self-pay

## 2021-02-13 ENCOUNTER — Other Ambulatory Visit
Admission: RE | Admit: 2021-02-13 | Discharge: 2021-02-13 | Disposition: A | Discharge status: Home / Self Care | Payer: BC Managed Care – PPO | Attending: Internal Medicine | Admitting: Internal Medicine

## 2021-02-13 ENCOUNTER — Telehealth (INDEPENDENT_AMBULATORY_CARE_PROVIDER_SITE_OTHER): Payer: Self-pay | Admitting: Internal Medicine

## 2021-02-13 DIAGNOSIS — N3000 Acute cystitis without hematuria: Secondary | ICD-10-CM

## 2021-02-13 DIAGNOSIS — R300 Dysuria: Secondary | ICD-10-CM | POA: Insufficient documentation

## 2021-02-13 DIAGNOSIS — Z1152 Encounter for screening for COVID-19: Secondary | ICD-10-CM

## 2021-02-13 LAB — URINALYSIS, ROUTINE W REFLEX MICROSCOPIC
Bilirubin Urine: NEGATIVE
Glucose, UA: NEGATIVE mg/dL
Hgb urine dipstick: NEGATIVE
Ketones, ur: NEGATIVE mg/dL
Nitrite: NEGATIVE
Protein, ur: NEGATIVE mg/dL
Specific Gravity, Urine: 1.015 (ref 1.005–1.030)
pH: 6 (ref 5.0–8.0)

## 2021-02-13 LAB — URINALYSIS, MICROSCOPIC (REFLEX)

## 2021-02-13 MED ORDER — CEFDINIR 300 MG PO CAPS: 300.0000 mg | ORAL_CAPSULE | Freq: Two times a day (BID) | ORAL | 0 refills | Status: DC

## 2021-02-13 NOTE — Progress Notes (Signed)
Virtual Visit via video Note  This visit type was conducted due to national recommendations for restrictions regarding the COVID-19 pandemic (e.g. social distancing).  This format is felt to be most appropriate for this patient at this time.  All issues noted in this document were discussed and addressed.  No physical exam was performed (except for noted visual exam findings with Video Visits).   I connected with Angela Pacheco by a video enabled telemedicine application and verified that I am speaking with the correct person using two identifiers. Location patient: home Location provider: work  Persons participating in the virtual visit: patient, provider  The limitations, risks, security and privacy concerns of performing an evaluation and management service by video and the availability of in person appointments have been discussed.  It has also been discussed with the patient that there may be a patient responsible charge related to this service. The patient expressed understanding and agreed to proceed.   Reason for visit: work in appt  HPI: Work in with concerns regarding possible UTI.  Reports - New Year - noticed increased pressure and discomfort.  E-visit - diagnosed with UTI.  Treated with macrobid.  Symptoms improved/resolved.  Three weeks later symptoms returned.  Took otc urine test - positive wbc's.  E-visit.  Treated with keflex.  Symptoms resolved.  With this episode, noticed increased urinary pressure and urgency.  Some discomfort.  No fever.  Increased frequency.  otc test - positive wbc's.  Eating.  No nausea or vomiting.  No vaginal symptoms.  States otherwise doing ok.  Handling stress.     ROS: See pertinent positives and negatives per HPI.  Past Medical History:  Diagnosis Date  . Allergy    cats trigger  . Asthma    mild  . B12 deficiency 05/06/2019  . Complication of anesthesia   . Hypothyroidism   . Thyroid disease     Past Surgical History:  Procedure  Laterality Date  . ABDOMINAL HYSTERECTOMY     cervix left in place  . APPENDECTOMY  1991  . CHOLECYSTECTOMY    . COLONOSCOPY WITH PROPOFOL N/A 10/08/2019   Procedure: COLONOSCOPY WITH PROPOFOL;  Surgeon: Lin Landsman, MD;  Location: [facility] ENDOSCOPY;  Service: Gastroenterology;  Laterality: N/A;    Family History  Problem Relation Age of Onset  . Arthritis Mother   . Breast cancer Neg Hx     SOCIAL HX: reviewed.    Current Outpatient Medications:  .  cefdinir (OMNICEF) 300 MG capsule, Take 1 capsule (300 mg total) by mouth 2 (two) times daily., Disp: 14 capsule, Rfl: 0 .  Calcium 600-200 MG-UNIT tablet, Take 1 tablet by mouth 2 (two) times daily with a meal., Disp: , Rfl:  .  cyanocobalamin (,VITAMIN B-12,) 1000 MCG/ML injection, Inject 1 mL (1,000 mcg total) into the muscle every 30 (thirty) days., Disp: 10 mL, Rfl: 0 .  fluconazole (DIFLUCAN) 150 MG tablet, Take after you finish the antibiotic., Disp: 1 tablet, Rfl: 0 .  levothyroxine (SYNTHROID) 100 MCG tablet, Take 1 tablet (100 mcg total) by mouth daily., Disp: 30 tablet, Rfl: 5 .  SYRINGE-NEEDLE, DISP, 3 ML (BD INTEGRA SYRINGE) 25G X 1" 3 ML MISC, Use as directed with B12 injections, Disp: 100 each, Rfl: 0 .  Triamcinolone Acetonide (NASACORT AQ NA), Place into the nose., Disp: , Rfl:   EXAM:  GENERAL: alert, oriented, appears well and in no acute distress  HEENT: atraumatic, conjunttiva clear, no obvious abnormalities on inspection of external  nose and ears  NECK: normal movements of the head and neck  LUNGS: on inspection no signs of respiratory distress, breathing rate appears normal, no obvious gross SOB, gasping or wheezing  CV: no obvious cyanosis  PSYCH/NEURO: pleasant and cooperative, no obvious depression or anxiety, speech and thought processing grossly intact  ASSESSMENT AND PLAN:  Discussed the following assessment and plan:  Problem List Items Addressed This Visit    UTI (urinary tract infection)     Symptoms appear to be c/w UTI.  Urinalysis with leukocyte esterase.  Will cover with abx.  Await culture results.  Stay hydrated.  Follow closely.       Relevant Medications   cefdinir (OMNICEF) 300 MG capsule    Other Visit Diagnoses    Encounter for screening for COVID-19    -  Primary   Relevant Orders   SARS-CoV-2 Semi-Quantitative Total Antibody, Spike       I discussed the assessment and treatment plan with the patient. The patient was provided an opportunity to ask questions and all were answered. The patient agreed with the plan and demonstrated an understanding of the instructions.   The patient was advised to call back or seek an in-person evaluation if the symptoms worsen or if the condition fails to improve as anticipated.    [name], MD

## 2021-02-13 NOTE — Telephone Encounter (Signed)
Urine ordered for Angela Pacheco. Pt scheduled for virtual at 5. Will log into my chart.

## 2021-02-14 ENCOUNTER — Encounter: Payer: Self-pay | Admitting: Internal Medicine

## 2021-02-14 DIAGNOSIS — N390 Urinary tract infection, site not specified: Secondary | ICD-10-CM | POA: Insufficient documentation

## 2021-02-14 NOTE — Assessment & Plan Note (Signed)
Symptoms appear to be c/w UTI.  Urinalysis with leukocyte esterase.  Will cover with abx.  Await culture results.  Stay hydrated.  Follow closely.

## 2021-02-15 LAB — URINE CULTURE: Culture: <10000 — AB

## 2021-02-21 ENCOUNTER — Encounter: Payer: Self-pay | Admitting: Internal Medicine

## 2021-04-01 ENCOUNTER — Other Ambulatory Visit: Payer: Self-pay

## 2021-04-01 ENCOUNTER — Ambulatory Visit (INDEPENDENT_AMBULATORY_CARE_PROVIDER_SITE_OTHER): Payer: BC Managed Care – PPO | Admitting: Internal Medicine

## 2021-04-01 DIAGNOSIS — E039 Hypothyroidism, unspecified: Secondary | ICD-10-CM

## 2021-04-01 DIAGNOSIS — J4520 Mild intermittent asthma, uncomplicated: Secondary | ICD-10-CM

## 2021-04-01 DIAGNOSIS — M79671 Pain in right foot: Secondary | ICD-10-CM

## 2021-04-01 DIAGNOSIS — Z1231 Encounter for screening mammogram for malignant neoplasm of breast: Secondary | ICD-10-CM | POA: Diagnosis not present

## 2021-04-01 DIAGNOSIS — N951 Menopausal and female climacteric states: Secondary | ICD-10-CM

## 2021-04-01 LAB — BASIC METABOLIC PANEL
BUN: 18 mg/dL (ref 6–23)
CO2: 26 mEq/L (ref 19–32)
Calcium: 9.4 mg/dL (ref 8.4–10.5)
Chloride: 107 mEq/L (ref 96–112)
Creatinine, Ser: 0.92 mg/dL (ref 0.40–1.20)
GFR: 71.32 mL/min (ref >60.00)
Glucose, Bld: 83 mg/dL (ref 70–99)
Potassium: 4.2 mEq/L (ref 3.5–5.1)
Sodium: 141 mEq/L (ref 135–145)

## 2021-04-01 LAB — LIPID PANEL
Cholesterol: 177 mg/dL (ref 0–200)
HDL: 63.4 mg/dL (ref >39.00)
LDL Cholesterol: 84 mg/dL (ref 0–99)
NonHDL: 113.26
Total CHOL/HDL Ratio: 3
Triglycerides: 147 mg/dL (ref 0.0–149.0)
VLDL: 29.4 mg/dL (ref 0.0–40.0)

## 2021-04-01 LAB — TSH: TSH: 1.05 u[IU]/mL (ref 0.35–4.50)

## 2021-04-01 NOTE — Progress Notes (Signed)
Patient ID: Angela Pacheco, female   DOB: 1966-01-03, 55 y.o.   MRN: [id number]   Subjective:    Patient ID: Angela Pacheco, female    DOB: 1966/02/02, 55 y.o.   MRN: [id number]  HPI This visit occurred during the SARS-CoV-2 public health emergency.  Safety protocols were in place, including screening questions prior to the visit, additional usage of staff PPE, and extensive cleaning of exam room while observing appropriate contact time as indicated for disinfecting solutions.  Patient here for a scheduled follow up. She is doing relatively well. Appears to be handling stress relatively well.  Has seen ortho - left shoulder pain - improving left shoulder impingement.  Also recent foot injury (right).  Diagnosed with sprain - initially placed in walking boot.  Reports persistent pain.  Feels ankle is weak.  Also pain - right heel.  Wearing crocs at home (support).  Worse in am and if stands a lot. Request referral to podiatry.  No chest pain or sob reported.  No abdominal pain or bowel change reported.  Request referral to gyn for menopausal symptoms.       Past Medical History:  Diagnosis Date  . Allergy    cats trigger  . Asthma    mild  . B12 deficiency 05/06/2019  . Complication of anesthesia   . Hypothyroidism   . Thyroid disease    Past Surgical History:  Procedure Laterality Date  . ABDOMINAL HYSTERECTOMY     cervix left in place  . APPENDECTOMY  1991  . CHOLECYSTECTOMY    . COLONOSCOPY WITH PROPOFOL N/A 10/08/2019   Procedure: COLONOSCOPY WITH PROPOFOL;  Surgeon: Lin Landsman, MD;  Location: [facility] ENDOSCOPY;  Service: Gastroenterology;  Laterality: N/A;   Family History  Problem Relation Age of Onset  . Arthritis Mother   . Breast cancer Neg Hx    Social History   Socioeconomic History  . Marital status: Married    Spouse name: Not on file  . Number of children: Not on file  . Years of education: Not on file  . Highest education level: Not on file   Occupational History  . Not on file  Tobacco Use  . Smoking status: Never Smoker  . Smokeless tobacco: Never Used  Substance and Sexual Activity  . Alcohol use: Yes    Alcohol/week: 0.0 standard drinks    Comment: occ.  . Drug use: No  . Sexual activity: Not on file  Other Topics Concern  . Not on file  Social History Narrative  . Not on file   Social Determinants of Health   Financial Resource Strain: Not on file  Food Insecurity: Not on file  Transportation Needs: Not on file  Physical Activity: Not on file  Stress: Not on file  Social Connections: Not on file    Outpatient Encounter Medications as of 04/01/2021  Medication Sig  . Calcium 600-200 MG-UNIT tablet Take 1 tablet by mouth 2 (two) times daily with a meal.  . cyanocobalamin (,VITAMIN B-12,) 1000 MCG/ML injection Inject 1 mL (1,000 mcg total) into the muscle every 30 (thirty) days.  Marland Kitchen levothyroxine (SYNTHROID) 100 MCG tablet Take 1 tablet (100 mcg total) by mouth daily.  . SYRINGE-NEEDLE, DISP, 3 ML (BD INTEGRA SYRINGE) 25G X 1" 3 ML MISC Use as directed with B12 injections  . Triamcinolone Acetonide (NASACORT AQ NA) Place into the nose.  . [DISCONTINUED] cefdinir (OMNICEF) 300 MG capsule Take 1 capsule (300 mg total) by mouth 2 (two)  times daily.  . [DISCONTINUED] fluconazole (DIFLUCAN) 150 MG tablet Take after you finish the antibiotic.   No facility-administered encounter medications on file as of 04/01/2021.    Review of Systems  Constitutional: Negative for appetite change and unexpected weight change.  HENT: Negative for congestion and sore throat.   Respiratory: Negative for cough, chest tightness and shortness of breath.   Cardiovascular: Negative for chest pain, palpitations and leg swelling.  Gastrointestinal: Negative for abdominal pain, diarrhea, nausea and vomiting.  Genitourinary: Negative for difficulty urinating and dysuria.  Musculoskeletal: Negative for joint swelling and myalgias.       Foot  pain/heel pain as outlined.    Skin: Negative for color change and rash.  Neurological: Negative for dizziness, light-headedness and headaches.  Psychiatric/Behavioral: Negative for agitation and dysphoric mood.       Objective:    Physical Exam Vitals reviewed.  Constitutional:      General: She is not in acute distress.    Appearance: Normal appearance.  HENT:     Head: Normocephalic and atraumatic.     Right Ear: External ear normal.     Left Ear: External ear normal.  Eyes:     General: No scleral icterus.       Right eye: No discharge.        Left eye: No discharge.     Conjunctiva/sclera: Conjunctivae normal.  Neck:     Thyroid: No thyromegaly.  Cardiovascular:     Rate and Rhythm: Normal rate and regular rhythm.  Pulmonary:     Effort: No respiratory distress.     Breath sounds: Normal breath sounds. No wheezing.  Abdominal:     General: Bowel sounds are normal.     Palpations: Abdomen is soft.     Tenderness: There is no abdominal tenderness.  Musculoskeletal:        General: No swelling or tenderness.     Cervical back: Neck supple. No tenderness.  Lymphadenopathy:     Cervical: No cervical adenopathy.  Skin:    Findings: No erythema or rash.  Neurological:     Mental Status: She is alert.  Psychiatric:        Mood and Affect: Mood normal.        Behavior: Behavior normal.     BP 118/70   Pulse 74   Temp 98 F (36.7 C) (Oral)   Resp 16   Ht 5' (1.524 m)   Wt 134 lb 12.8 oz (61.1 kg)   SpO2 98%   BMI 26.33 kg/m  Wt Readings from Last 3 Encounters:  04/01/21 134 lb 12.8 oz (61.1 kg)  10/01/20 135 lb (61.2 kg)  09/19/20 138 lb 3.2 oz (62.7 kg)     Lab Results  Component Value Date   WBC 7.3 12/18/2020   HGB 13.9 12/18/2020   HCT 42.0 12/18/2020   PLT 307.0 12/18/2020   GLUCOSE 83 04/01/2021   CHOL 177 04/01/2021   TRIG 147.0 04/01/2021   HDL 63.40 04/01/2021   LDLCALC 84 04/01/2021   ALT 34 12/18/2020   AST 20 12/18/2020   NA 141  04/01/2021   K 4.2 04/01/2021   CL 107 04/01/2021   CREATININE 0.92 04/01/2021   BUN 18 04/01/2021   CO2 26 04/01/2021   TSH 1.05 04/01/2021       Assessment & Plan:   Problem List Items Addressed This Visit    Asthma    Breathing stable.  No sob.  Hypothyroidism    On thyroid replacement.  Follow tsh.       Relevant Orders   TSH (Completed)   Lipid panel (Completed)   Basic metabolic panel (Completed)   Menopausal symptoms    Request referral to gyn.       Relevant Orders   Ambulatory referral to Gynecology   Right foot pain    Persistent pain as outlined.  Continue supports as doing.  Request referral to podiatry.       Relevant Orders   Ambulatory referral to Podiatry    Other Visit Diagnoses    Visit for screening mammogram       Relevant Orders   MM 3D SCREEN BREAST BILATERAL       [name], MD

## 2021-04-03 ENCOUNTER — Encounter: Payer: Self-pay | Admitting: Internal Medicine

## 2021-04-03 DIAGNOSIS — M79671 Pain in right foot: Secondary | ICD-10-CM | POA: Insufficient documentation

## 2021-04-03 NOTE — Assessment & Plan Note (Signed)
Request referral to gyn.

## 2021-04-03 NOTE — Assessment & Plan Note (Signed)
Persistent pain as outlined.  Continue supports as doing.  Request referral to podiatry.

## 2021-04-03 NOTE — Assessment & Plan Note (Signed)
Breathing stable.  No sob.

## 2021-04-03 NOTE — Assessment & Plan Note (Signed)
On thyroid replacement.  Follow tsh.  

## 2021-04-21 ENCOUNTER — Ambulatory Visit (INDEPENDENT_AMBULATORY_CARE_PROVIDER_SITE_OTHER): Payer: BC Managed Care – PPO

## 2021-04-21 ENCOUNTER — Ambulatory Visit: Payer: BC Managed Care – PPO | Admitting: Podiatry

## 2021-04-21 ENCOUNTER — Other Ambulatory Visit: Payer: Self-pay

## 2021-04-21 DIAGNOSIS — M778 Other enthesopathies, not elsewhere classified: Secondary | ICD-10-CM

## 2021-04-21 DIAGNOSIS — M722 Plantar fascial fibromatosis: Secondary | ICD-10-CM

## 2021-04-21 MED ORDER — METHYLPREDNISOLONE 4 MG PO TBPK: ORAL_TABLET | ORAL | 0 refills | Status: DC

## 2021-04-21 MED ORDER — MELOXICAM 15 MG PO TABS: 15.0000 mg | ORAL_TABLET | Freq: Every day | ORAL | 1 refills | Status: DC

## 2021-04-21 NOTE — Progress Notes (Signed)
   Subjective: 55 y.o. female presenting as a new patient for 2 separate complaints.  Patient states that she has experienced right heel pain for the past year.  Patient states that she has had intermittent pain to the right heel that can be very debilitating at times.  It is aggravated by walking and with certain shoes.  She has not done anything for treatment.  Most recently the patient sustained an injury in January 2022 injuring the top of her right foot.  Patient was seen at [facility] and minimally treated with minimal improvement.  She states that it was recommended she wear a cam boot for approximately 1 week.  She did feel better for that 1 week however when she discontinued the boot the pain recurred.  She presents for second opinion for further treatment and evaluation   Past Medical History:  Diagnosis Date  . Allergy    cats trigger  . Asthma    mild  . B12 deficiency 05/06/2019  . Complication of anesthesia   . Hypothyroidism   . Thyroid disease      Objective: Physical Exam General: The patient is alert and oriented x3 in no acute distress.  Dermatology: Skin is warm, dry and supple bilateral lower extremities. Negative for open lesions or macerations bilateral.   Vascular: Dorsalis Pedis and Posterior Tibial pulses palpable bilateral.  Capillary fill time is immediate to all digits.  Neurological: Epicritic and protective threshold intact bilateral.   Musculoskeletal: Tenderness to palpation to the plantar aspect of the right heel along the plantar fascia.  There is also pain on palpation throughout the midtarsal joint dorsally right foot.  All other joints range of motion within normal limits bilateral. Strength 5/5 in all groups bilateral.   Radiographic exam: Normal osseous mineralization. Joint spaces preserved. No fracture/dislocation/boney destruction. No other soft tissue abnormalities or radiopaque foreign bodies.   Assessment: 1. Plantar fasciitis  right 2.  Capsulitis right foot  Plan of Care:  1. Patient evaluated. Xrays reviewed.   2.  Patient declined any steroid injections today 3. Rx for Medrol Dose Pack placed 4. Rx for Meloxicam ordered for patient. 5.  A new cam boot was dispensed.  Weightbearing as tolerated x4 weeks. 6.  Compression ankle sleeve dispensed 7.  Instructed patient regarding therapies and modalities at home to alleviate symptoms.  8. Return to clinic in 4 weeks.    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 Edrick Kins, DPM    [address], [place] [address]                Office [phone]  Fax [phone]

## 2021-04-29 ENCOUNTER — Encounter: Payer: Self-pay | Admitting: Podiatry

## 2021-05-14 NOTE — Telephone Encounter (Signed)
Patient calling to cancel upcoming appointment and inform Dr. Amalia Hailey due to lack of response, patient has canceled all future appointments and would not like to return for services.

## 2021-05-18 IMAGING — MG DIGITAL SCREENING BILATERAL MAMMOGRAM WITH TOMO AND CAD
6 of 10 series · 6 of 30 positions shown · non-contrast
Comparison: Previous exam(s).

CLINICAL DATA: Screening.

EXAM:
DIGITAL SCREENING BILATERAL MAMMOGRAM WITH TOMO AND CAD

[R MLO synth-2D (1 of 2)]
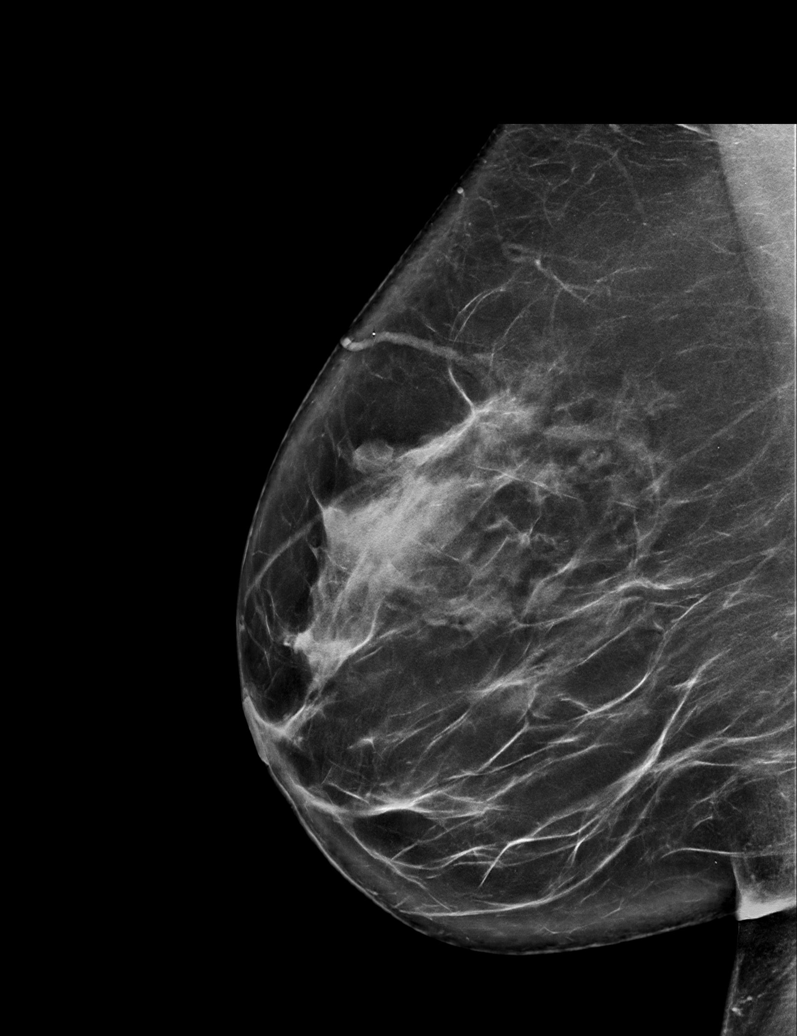

[L MLO synth-2D]
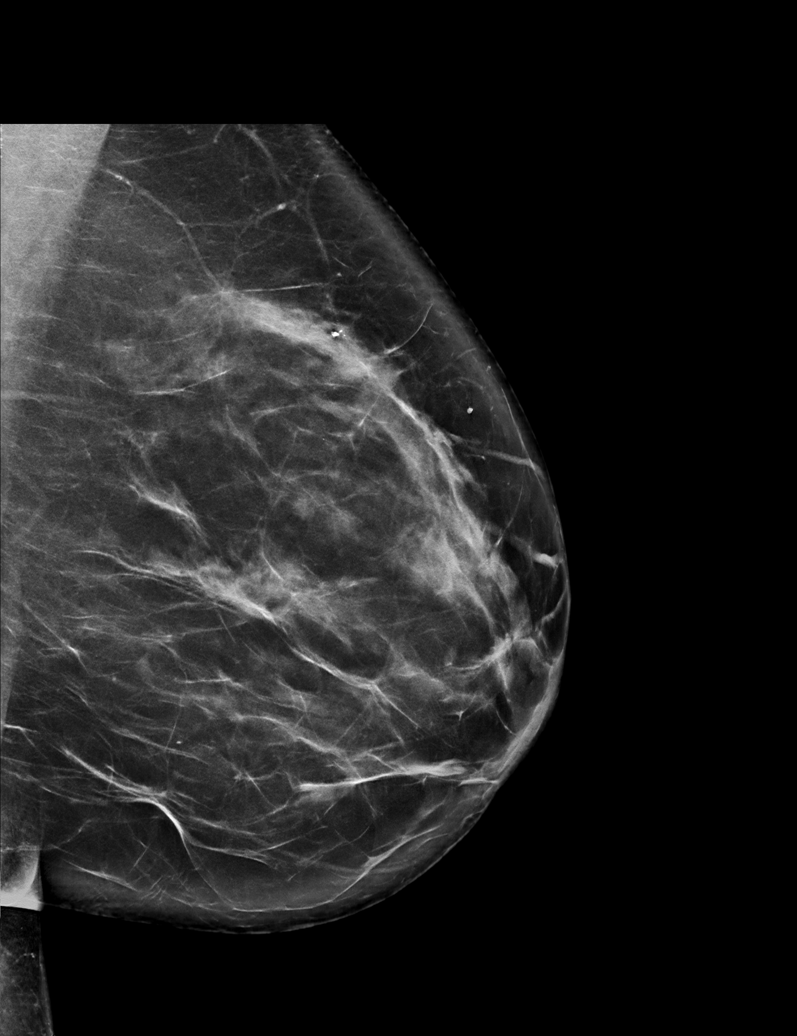

[L CC synth-2D]
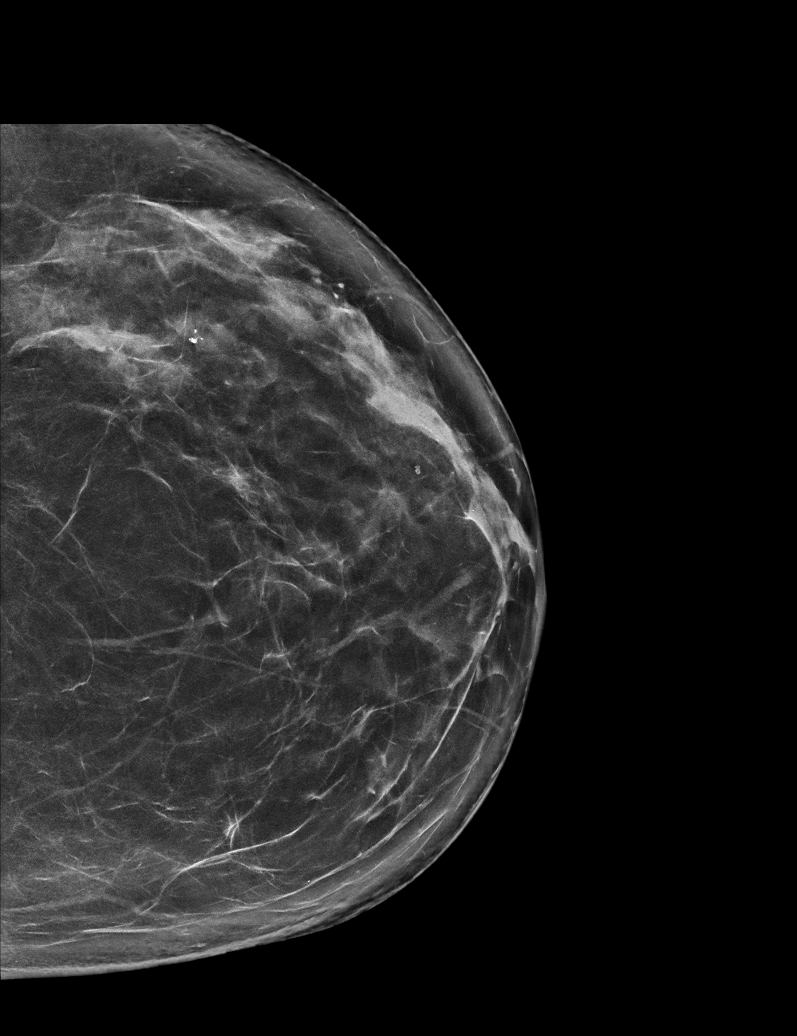

[R CC synth-2D]
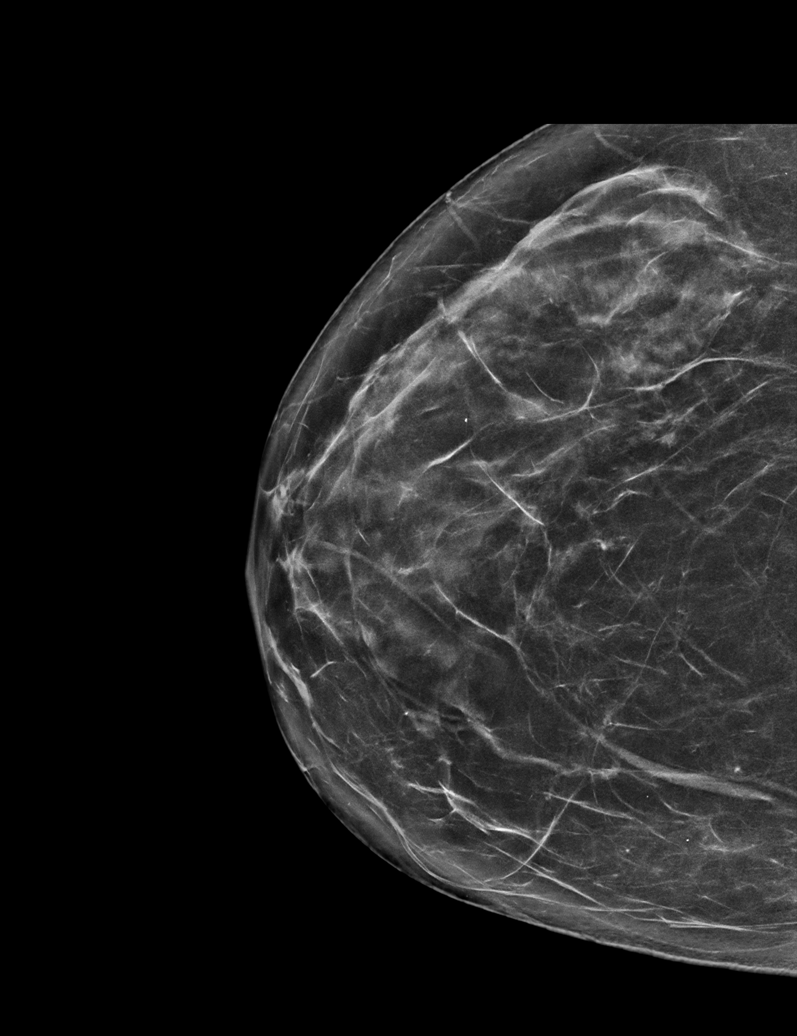

[R MLO synth-2D (2 of 2)]
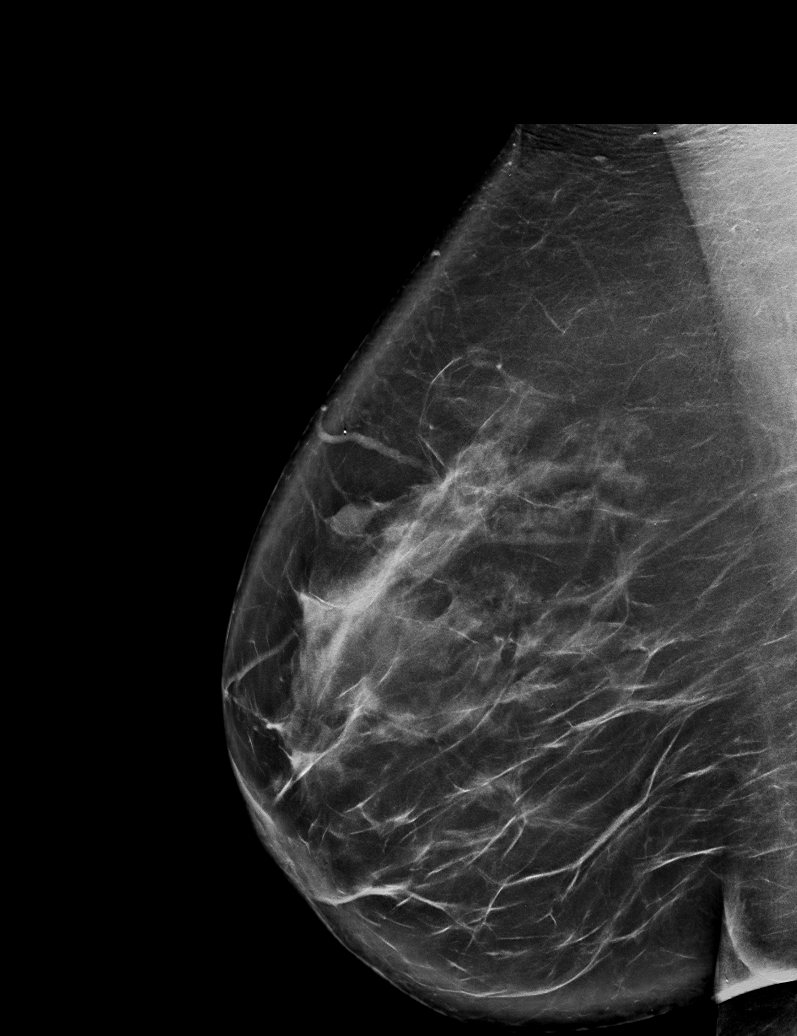

[L MLO tomo · tomo slice 45/88.0]
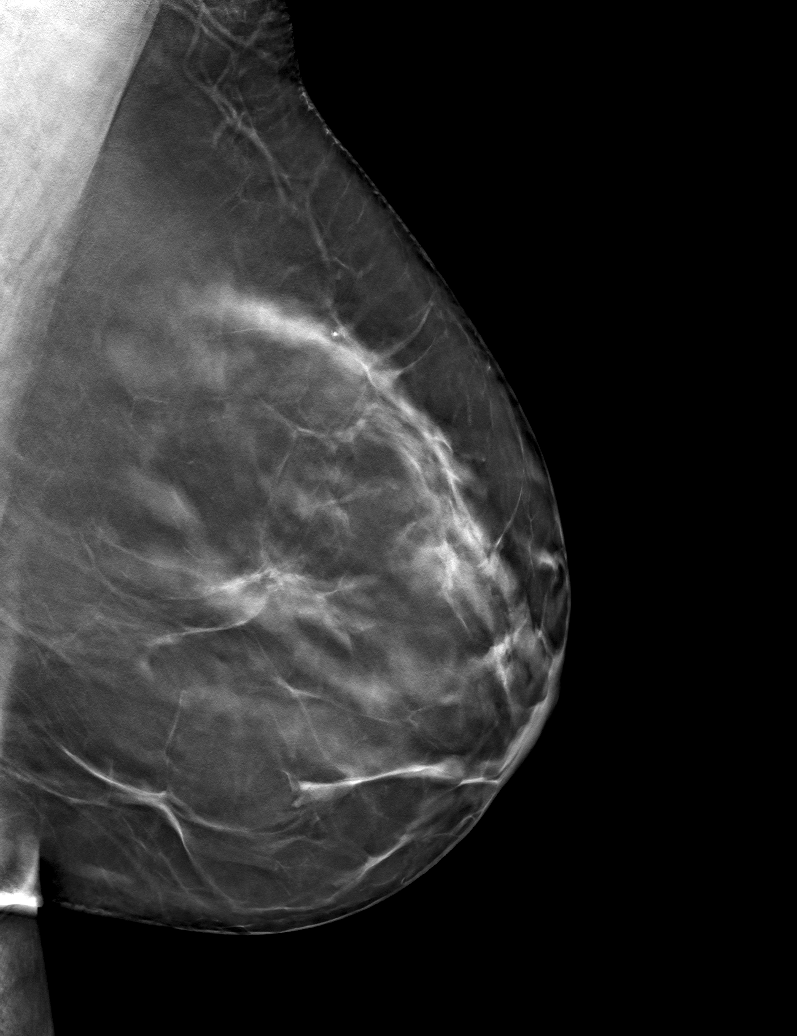

[6 of 30 positions shown; findings below may reference images not displayed]

ACR Breast Density Category c: The breast tissue is heterogeneously
dense, which may obscure small masses.
FINDINGS: There are no findings suspicious for malignancy. Images were
processed with CAD.
IMPRESSION: No mammographic evidence of malignancy. A result letter of this
screening mammogram will be mailed directly to the patient.

RECOMMENDATION:
Screening mammogram in one year. (Code:FT-U-LHB)

BI-RADS CATEGORY  1: Negative.

## 2021-05-19 ENCOUNTER — Ambulatory Visit: Payer: BC Managed Care – PPO | Admitting: Podiatry

## 2021-05-21 ENCOUNTER — Other Ambulatory Visit: Payer: Self-pay | Admitting: Internal Medicine

## 2021-05-23 ENCOUNTER — Other Ambulatory Visit: Payer: Self-pay | Admitting: Internal Medicine

## 2021-05-26 ENCOUNTER — Encounter: Payer: Self-pay | Admitting: Internal Medicine

## 2021-05-31 ENCOUNTER — Encounter: Payer: Self-pay | Admitting: Internal Medicine

## 2021-06-01 ENCOUNTER — Other Ambulatory Visit: Payer: Self-pay

## 2021-06-01 MED ORDER — "BD INTEGRA SYRINGE 25G X 1"" 3 ML MISC": 0 refills | Status: DC

## 2021-06-19 ENCOUNTER — Encounter: Payer: Self-pay | Admitting: Internal Medicine

## 2021-06-19 ENCOUNTER — Other Ambulatory Visit: Payer: Self-pay | Admitting: Podiatry

## 2021-06-29 ENCOUNTER — Telehealth: Payer: BC Managed Care – PPO | Admitting: Nurse Practitioner

## 2021-06-29 NOTE — Progress Notes (Signed)
Patient schedule e-visit but is currently in [place]. Advised face to face or local care given she is outside of treatment area allotted by tele health group.

## 2021-07-06 ENCOUNTER — Encounter: Payer: Self-pay | Admitting: Internal Medicine

## 2021-07-07 ENCOUNTER — Telehealth (INDEPENDENT_AMBULATORY_CARE_PROVIDER_SITE_OTHER): Payer: BC Managed Care – PPO | Admitting: Internal Medicine

## 2021-07-07 ENCOUNTER — Telehealth: Payer: Self-pay | Admitting: Internal Medicine

## 2021-07-07 ENCOUNTER — Encounter: Payer: Self-pay | Admitting: Internal Medicine

## 2021-07-07 ENCOUNTER — Other Ambulatory Visit: Payer: Self-pay

## 2021-07-07 DIAGNOSIS — U071 COVID-19: Secondary | ICD-10-CM | POA: Diagnosis not present

## 2021-07-07 DIAGNOSIS — J4520 Mild intermittent asthma, uncomplicated: Secondary | ICD-10-CM

## 2021-07-07 MED ORDER — ALBUTEROL SULFATE HFA 108 (90 BASE) MCG/ACT IN AERS: 2.0000 | INHALATION_SPRAY | Freq: Four times a day (QID) | RESPIRATORY_TRACT | 1 refills | Status: DC | PRN

## 2021-07-07 NOTE — Telephone Encounter (Signed)
Pt has an appointment with Dr. Nicki Reaper virtually today at 4:30.

## 2021-07-07 NOTE — Telephone Encounter (Signed)
Patient symptoms started on Friday, no fever, scratchy throat, some chest congestion,  no cough, just post nasal drip . Patient is using rescue inhaler every 4 hours, just as precaution, also taking these at these doses.VITAMIN C 1000 MG DAILY,  VITAMIN D 4000 IU DAILY,ZINC 50 MG DAILY , using Mucinex alternating tylenol , and Ibuprofen for body aches. Patient would still like to be worked in for virtual , even though advised she was doing the right things for mild symptoms.

## 2021-07-07 NOTE — Telephone Encounter (Signed)
Ok to add virtual at end of day today.

## 2021-07-07 NOTE — Telephone Encounter (Signed)
My chart message sent to [facility] for update.

## 2021-07-07 NOTE — Telephone Encounter (Signed)
Patient called and would like advice on being tested positive for Covid. Patient was transferred to [facility] at [place].

## 2021-07-07 NOTE — Telephone Encounter (Signed)
Pt scheduled for VV today at 430p.

## 2021-07-07 NOTE — Telephone Encounter (Signed)
Patient was instructed to go to ED per access nurse.

## 2021-07-07 NOTE — Progress Notes (Signed)
Patient ID: Angela Pacheco, female   DOB: [birth year], 55 y.o.   MRN: [id number]   Virtual Visit via video Note  This visit type was conducted due to national recommendations for restrictions regarding the COVID-19 pandemic (e.g. social distancing).  This format is felt to be most appropriate for this patient at this time.  All issues noted in this document were discussed and addressed.  No physical exam was performed (except for noted visual exam findings with Video Visits).   I connected with Angela Pacheco by a video enabled telemedicine application and verified that I am speaking with the correct person using two identifiers. Location patient: home Location provider: work Persons participating in the virtual visit: patient, provider  The limitations, risks, security and privacy concerns of performing an evaluation and management service by video and the availability of in person appointments have been discussed.  It has also been discussed with the patient that there may be a patient responsible charge related to this service. The patient expressed understanding and agreed to proceed.   Reason for visit: work in appt  HPI: Work in - covid positive.  Recently visited her son - in [place].  Noticed GI symptoms (Thursday/Friday 07/03/21).  Noticed diarrhea. Took imodium and pepto bismol.  Started - bland diet/light diet.  Developed nasal burning, sinus congestion and sinus headache.  Ear pain.  Sore throat.  No fever.  Aching.  Decreased appetite.  Occasional nausea.  Does get hungry.  No loss of taste and no loss of smell.  No chest pain or sob.  Some chest congestion.  Increased drainage.  Increased fatigue.  Tickle - cough.  Discussed rescue inhaler.  Mucinex.  Did use nasacort yesterday.     ROS: See pertinent positives and negatives per HPI.  Past Medical History:  Diagnosis Date   Allergy    cats trigger   Asthma    mild   B12 deficiency 5/42/7062   Complication of anesthesia     Hypothyroidism    Thyroid disease     Past Surgical History:  Procedure Laterality Date   ABDOMINAL HYSTERECTOMY     cervix left in place   APPENDECTOMY  1991   CHOLECYSTECTOMY     COLONOSCOPY WITH PROPOFOL N/A 10/08/2019   Procedure: COLONOSCOPY WITH PROPOFOL;  Surgeon: Lin Landsman, MD;  Location: [facility] END[facility]COPY;  Service: Gastroenterology;  Laterality: N/A;    Family History  Problem Relation Age of Onset   Arthritis Mother    Breast cancer Neg Hx     SOCIAL HX: reviewed.    Current Outpatient Medications:    albuterol (VENTOLIN HFA) 108 (90 Base) MCG/ACT inhaler, Inhale 2 puffs into the lungs every 6 (six) hours as needed for wheezing or shortness of breath., Disp: 18 g, Rfl: 1   Calcium 600-200 MG-UNIT tablet, Take 1 tablet by mouth 2 (two) times daily with a meal., Disp: , Rfl:    cyanocobalamin (,VITAMIN B-12,) 1000 MCG/ML injection, INJECT 1 ML IN THE MUSCLE EVERY 30 DAYS, Disp: 10 mL, Rfl: 0   levothyroxine (SYNTHROID) 100 MCG tablet, TAKE 1 TABLET(100 MCG) BY MOUTH DAILY, Disp: 30 tablet, Rfl: 5   meloxicam (MOBIC) 15 MG tablet, TAKE 1 TABLET(15 MG) BY MOUTH DAILY, Disp: 30 tablet, Rfl: 1   SYRINGE-NEEDLE, DISP, 3 ML (BD INTEGRA SYRINGE) 25G X 1" 3 ML MISC, Use as directed with B12 injections, Disp: 100 each, Rfl: 0   Triamcinolone Acetonide (NASACORT AQ NA), Place into the nose., Disp: , Rfl:  amoxicillin-clavulanate (AUGMENTIN) 875-125 MG tablet, Take 1 tablet by mouth 2 (two) times daily., Disp: 14 tablet, Rfl: 0   fluticasone (FLONASE) 50 MCG/ACT nasal spray, Place 2 sprays into both nostrils daily., Disp: 16 g, Rfl: 6  EXAM:  GENERAL: alert, oriented, appears well and in no acute distress  HEENT: atraumatic, conjunttiva clear, no obvious abnormalities on inspection of external nose and ears  NECK: normal movements of the head and neck  LUNGS: on inspection no signs of respiratory distress, breathing rate appears normal, no obvious gross SOB,  gasping or wheezing  CV: no obvious cyanosis  PSYCH/NEURO: pleasant and cooperative, no obvious depression or anxiety, speech and thought processing grossly intact  ASSESSMENT AND PLAN:  Discussed the following assessment and plan:  Problem List Items Addressed This Visit     Asthma    With covid.  Denies any chest pain or increased sob.  rx for rescue inhaler if needed.   Follow.  Call with update.        Relevant Medications   albuterol (VENTOLIN HFA) 108 (90 Base) MCG/ACT inhaler   COVID-19 virus infection    Symptoms started 07/03/21.  Home PCR test positive.  Symptoms as outlined.  No chest pain, chest tightness or sob.  Is eating.  Discussed staying hydrated.  rx for albuterol inhaler if needed.  mucinex as directed.  Saline nasal spray and nasacort nasal spray as directed.  Discussed oral antiviral medications.  Wants to hold.  Follow.  Discussed quarantine guidelines.  Call with update.          Return if symptoms worsen or fail to improve.   I discussed the assessment and treatment plan with the patient. The patient was provided an opportunity to ask questions and all were answered. The patient agreed with the plan and demonstrated an understanding of the instructions.   The patient was advised to call back or seek an in-person evaluation if the symptoms worsen or if the condition fails to improve as anticipated.    [name], MD

## 2021-07-11 ENCOUNTER — Encounter: Payer: Self-pay | Admitting: Physician Assistant

## 2021-07-11 ENCOUNTER — Telehealth: Payer: BC Managed Care – PPO | Admitting: Physician Assistant

## 2021-07-11 DIAGNOSIS — U071 COVID-19: Secondary | ICD-10-CM

## 2021-07-11 DIAGNOSIS — J018 Other acute sinusitis: Secondary | ICD-10-CM

## 2021-07-11 DIAGNOSIS — H9201 Otalgia, right ear: Secondary | ICD-10-CM | POA: Diagnosis not present

## 2021-07-11 MED ORDER — FLUTICASONE PROPIONATE 50 MCG/ACT NA SUSP: 2.0000 | Freq: Every day | NASAL | 6 refills | Status: DC

## 2021-07-11 MED ORDER — AMOXICILLIN-POT CLAVULANATE 875-125 MG PO TABS: 1.0000 | ORAL_TABLET | Freq: Two times a day (BID) | ORAL | 0 refills | Status: DC

## 2021-07-11 NOTE — Progress Notes (Signed)
E-Visit for Sinus Problems  We are sorry that you are not feeling well.  Here is how we plan to help!  Based on what you have shared with me it looks like you have sinusitis.  Sinusitis is inflammation and infection in the sinus cavities of the head.  Based on your presentation I believe you most likely have Acute Bacterial Sinusitis.  This is an infection caused by bacteria and is treated with antibiotics. I have prescribed Augmentin 875mg /125mg  one tablet twice daily with food, for 7 days. I have also prescribed Flonase which  helps with nasal congestion. Use this nasal decongestant only if you are not currently using any other nasal decongestant. You may use an oral decongestant such as Mucinex D or if you have glaucoma or high blood pressure use plain Mucinex. Saline nasal spray help and can safely be used as often as needed for congestion.  If you develop worsening sinus pain, fever or notice severe headache and vision changes, or if symptoms are not better after completion of antibiotic, please schedule an appointment with a health care provider.    Sinus infections are not as easily transmitted as other respiratory infection, however we still recommend that you avoid close contact with loved ones, especially the very young and elderly.  Remember to wash your hands thoroughly throughout the day as this is the number one way to prevent the spread of infection!   Since we are not provided with details of your ear pain- left ear pain maybe related to your sinus symptoms, covid 19, or due to ear infection. If prescribed antibiotic does not help ear pain, please submit another Evisit for ear pain or have a face to face evaluation of your symptoms.     E-Visit  for Positive Covid Test Result  We are sorry you are not feeling well. We are here to help!  You have tested positive for COVID-19, meaning that you were infected with the novel coronavirus and could give the virus to others.  It is vitally  important that you stay home so you do not spread it to others.      Please continue isolation at home, for at least 10 days since the start of your symptoms and until you have had 24 hours with no fever (without taking a fever reducer) and with improving of symptoms.  If you have no symptoms but tested positive (or all symptoms resolve after 5 days and you have no fever) you can leave your house but continue to wear a mask around others for an additional 5 days. If you have a fever,continue to stay home until you have had 24 hours of no fever. Most cases improve 5-10 days from onset but we have seen a small number of patients who have gotten worse after the 10 days.  Please be sure to watch for worsening symptoms and remain taking the proper precautions.   Go to the nearest hospital ED for assessment if fever/cough/breathlessness are severe or illness seems like a threat to life.    The following symptoms may appear 2-14 days after exposure: Fever Cough Shortness of breath or difficulty breathing Chills Repeated shaking with chills Muscle pain Headache Sore throat New loss of taste or smell Fatigue Congestion or runny nose Nausea or vomiting Diarrhea  You have been enrolled in [place] for COVID-19. Daily you will receive a questionnaire within the [facility] website. Our COVID-19 response team will be monitoring your responses daily.  You can  use medication such as prescription for Fluticasone nasal spray 2 sprays in each nostril one time per day  If you are having a medical emergency, call 911.  HOME CARE Only take medications as instructed by your medical team. Drink plenty of fluids and get plenty of rest. A steam or ultrasonic humidifier can help if you have congestion.   GET HELP RIGHT AWAY IF YOU HAVE EMERGENCY WARNING SIGNS** FOR COVID-19. If you or someone is showing any of these signs seek emergency medical care immediately. Call 911 or proceed to your closest  emergency facility if: You develop worsening high fever. Trouble breathing Bluish lips or face Persistent pain or pressure in the chest New confusion Inability to wake or stay awake You cough up blood. Your symptoms become more severe  **This list is not all possible symptoms. Contact your medical provider for any symptoms that are sever or concerning to you.  MAKE SURE YOU  Understand these instructions. Will watch your condition. Will get help right away if you are not doing well or get worse.  Your e-visit answers were reviewed by a board certified advanced clinical practitioner to complete your personal care plan.  Depending on the condition, your plan could have included both over the counter or prescription medications.  If there is a problem please reply once you have received a response from your provider.  Your safety is important to [place].  If you have drug allergies check your prescription carefully.    You can use MyChart to ask questions about today's visit, request a non-urgent call back, or ask for a work or school excuse for 24 hours related to this e-Visit. If it has been greater than 24 hours you will need to follow up with your provider, or enter a new e-Visit to address those concerns. You will get an e-mail in the next two days asking about your experience.  I hope that your e-visit has been valuable and will speed your recovery. Thank you for using e-visits. I spent 5-10 minutes on review and completion of this note- Lacy Duverney [facility]

## 2021-07-13 ENCOUNTER — Encounter: Payer: Self-pay | Admitting: Internal Medicine

## 2021-07-13 DIAGNOSIS — U071 COVID-19: Secondary | ICD-10-CM | POA: Insufficient documentation

## 2021-07-13 NOTE — Assessment & Plan Note (Signed)
With covid.  Denies any chest pain or increased sob.  rx for rescue inhaler if needed.   Follow.  Call with update.

## 2021-07-13 NOTE — Assessment & Plan Note (Signed)
Symptoms started 07/03/21.  Home PCR test positive.  Symptoms as outlined.  No chest pain, chest tightness or sob.  Is eating.  Discussed staying hydrated.  rx for albuterol inhaler if needed.  mucinex as directed.  Saline nasal spray and nasacort nasal spray as directed.  Discussed oral antiviral medications.  Wants to hold.  Follow.  Discussed quarantine guidelines.  Call with update.

## 2021-07-22 ENCOUNTER — Other Ambulatory Visit: Payer: Self-pay

## 2021-07-22 ENCOUNTER — Ambulatory Visit
Admission: RE | Admit: 2021-07-22 | Discharge: 2021-07-22 | Disposition: A | Discharge status: Home / Self Care | Payer: BC Managed Care – PPO | Source: Ambulatory Visit | Attending: Internal Medicine | Admitting: Internal Medicine

## 2021-07-22 DIAGNOSIS — Z1231 Encounter for screening mammogram for malignant neoplasm of breast: Secondary | ICD-10-CM | POA: Insufficient documentation

## 2021-08-22 ENCOUNTER — Other Ambulatory Visit: Payer: Self-pay | Admitting: Internal Medicine

## 2021-09-14 ENCOUNTER — Other Ambulatory Visit: Payer: Self-pay

## 2021-09-14 ENCOUNTER — Ambulatory Visit: Payer: BC Managed Care – PPO | Admitting: Dermatology

## 2021-09-14 DIAGNOSIS — L821 Other seborrheic keratosis: Secondary | ICD-10-CM

## 2021-09-14 DIAGNOSIS — Z1283 Encounter for screening for malignant neoplasm of skin: Secondary | ICD-10-CM

## 2021-09-14 DIAGNOSIS — Z86018 Personal history of other benign neoplasm: Secondary | ICD-10-CM

## 2021-09-14 DIAGNOSIS — D2362 Other benign neoplasm of skin of left upper limb, including shoulder: Secondary | ICD-10-CM

## 2021-09-14 DIAGNOSIS — L738 Other specified follicular disorders: Secondary | ICD-10-CM

## 2021-09-14 DIAGNOSIS — D225 Melanocytic nevi of trunk: Secondary | ICD-10-CM

## 2021-09-14 DIAGNOSIS — L578 Other skin changes due to chronic exposure to nonionizing radiation: Secondary | ICD-10-CM

## 2021-09-14 DIAGNOSIS — D1800 Hemangioma unspecified site: Secondary | ICD-10-CM

## 2021-09-14 DIAGNOSIS — D239 Other benign neoplasm of skin, unspecified: Secondary | ICD-10-CM

## 2021-09-14 DIAGNOSIS — Z808 Family history of malignant neoplasm of other organs or systems: Secondary | ICD-10-CM

## 2021-09-14 DIAGNOSIS — L814 Other melanin hyperpigmentation: Secondary | ICD-10-CM

## 2021-09-14 DIAGNOSIS — D229 Melanocytic nevi, unspecified: Secondary | ICD-10-CM

## 2021-09-14 DIAGNOSIS — L853 Xerosis cutis: Secondary | ICD-10-CM

## 2021-09-14 DIAGNOSIS — D2372 Other benign neoplasm of skin of left lower limb, including hip: Secondary | ICD-10-CM

## 2021-09-14 NOTE — Progress Notes (Signed)
New Patient Visit  Subjective  Angela Pacheco is a 55 y.o. female who presents for the following: TBSE (Patient here for full body skin exam and skin cancer screening. Patient does have fhx of melanoma, patient's father. No personal hx of skin cancer but patient has had dysplastic nevi. No new or changing spots that patient is aware of. ).   The following portions of the chart were reviewed this encounter and updated as appropriate:       Review of Systems:  No other skin or systemic complaints except as noted in HPI or Assessment and Plan.  Objective  Well appearing patient in no apparent distress; mood and affect are within normal limits.  A full examination was performed including scalp, head, eyes, ears, nose, lips, neck, chest, axillae, abdomen, back, buttocks, bilateral upper extremities, bilateral lower extremities, hands, feet, fingers, toes, fingernails, and toenails. All findings within normal limits unless otherwise noted below.  Left Posterior Shoulder, left lower pretibia Firm pink/brown papulenodule with dimple sign.   forehead Small yellow papules with a central dell.   Back Tan-brown and/or pink-flesh-colored symmetric macules and papules.               Assessment & Plan  Dermatofibroma Left Posterior Shoulder, left lower pretibia  Benign-appearing.  Observation.  Call clinic for new or changing lesions.  Recommend daily use of broad spectrum spf 30+ sunscreen to sun-exposed areas.    Sebaceous hyperplasia forehead  Benign-appearing.  Observation.  Call clinic for new or changing lesions.  Recommend daily use of broad spectrum spf 30+ sunscreen to sun-exposed areas.    Nevus Back  Benign-appearing.  Observation.  Call clinic for new or changing lesions.  Recommend daily use of broad spectrum spf 30+ sunscreen to sun-exposed areas.   ABCDEs of mole observation discussed.  RTC if any changes noted.      Lentigines - Scattered tan  macules - Due to sun exposure - Benign-appearing, observe - Recommend daily broad spectrum sunscreen SPF 30+ to sun-exposed areas, reapply every 2 hours as needed. - Call for any changes  Seborrheic Keratoses - Stuck-on, waxy, tan-brown papules and/or plaques  - Benign-appearing - Discussed benign etiology and prognosis. - Observe - Call for any changes  Melanocytic Nevi - Tan-brown and/or pink-flesh-colored symmetric macules and papules - Benign appearing on exam today - Observation - Call clinic for new or changing moles - Recommend daily use of broad spectrum spf 30+ sunscreen to sun-exposed areas.   Hemangiomas - Red papules - Discussed benign nature - Observe - Call for any changes  Actinic Damage - Chronic condition, secondary to cumulative UV/sun exposure - diffuse scaly erythematous macules with underlying dyspigmentation - Recommend daily broad spectrum sunscreen SPF 30+ to sun-exposed areas, reapply every 2 hours as needed.  - Staying in the shade or wearing long sleeves, sun glasses (UVA+UVB protection) and wide brim hats (4-inch brim around the entire circumference of the hat) are also recommended for sun protection.  - Call for new or changing lesions.  Skin cancer screening performed today.  History of Dysplastic Nevi - No evidence of recurrence today - Recommend regular full body skin exams - Recommend daily broad spectrum sunscreen SPF 30+ to sun-exposed areas, reapply every 2 hours as needed.  - Call if any new or changing lesions are noted between office visits - Will request records from previous dermatologist.  Xerosis - diffuse xerotic patches - recommend gentle, hydrating skin care - gentle skin care handout given  Return in about 6 months (around 03/14/2022) for recheck moles.  Graciella Belton, RMA, am acting as scribe for Brendolyn Patty, MD .  Documentation: I have reviewed the above documentation for accuracy and completeness, and I agree  with the above.  Brendolyn Patty MD

## 2021-09-14 NOTE — Patient Instructions (Addendum)
Gentle Skin Care Guide  1. Bathe no more than once a day.  2. Avoid bathing in hot water  3. Use a mild soap like Dove, Vanicream, Cetaphil, CeraVe. Can use Lever 2000 or Cetaphil antibacterial soap  4. Use soap only where you need it. On most days, use it under your arms, between your legs, and on your feet. Let the water rinse other areas unless visibly dirty.  5. When you get out of the bath/shower, use a towel to gently blot your skin dry, don't rub it.  6. While your skin is still a little damp, apply a moisturizing cream such as Vanicream, CeraVe, Cetaphil, Eucerin, Sarna lotion or plain Vaseline Jelly. For hands apply Neutrogena [place] Hand Cream or Excipial Hand Cream.  7. Reapply moisturizer any time you start to itch or feel dry.  8. Sometimes using free and clear laundry detergents can be helpful. Fabric softener sheets should be avoided. Downy Free & Gentle liquid, or any liquid fabric softener that is free of dyes and perfumes, it acceptable to use  9. If your doctor has given you prescription creams you may apply moisturizers over them   Melanoma ABCDEs  Melanoma is the most dangerous type of skin cancer, and is the leading cause of death from skin disease.  You are more likely to develop melanoma if you: Have light-colored skin, light-colored eyes, or red or blond hair Spend a lot of time in the sun Tan regularly, either outdoors or in a tanning bed Have had blistering sunburns, especially during childhood Have a close family member who has had a melanoma Have atypical moles or large birthmarks  Early detection of melanoma is key since treatment is typically straightforward and cure rates are extremely high if we catch it early.   The first sign of melanoma is often a change in a mole or a new dark spot.  The ABCDE system is a way of remembering the signs of melanoma.  A for asymmetry:  The two halves do not match. B for border:  The edges of the growth are  irregular. C for color:  A mixture of colors are present instead of an even brown color. D for diameter:  Melanomas are usually (but not always) greater than 80m - the size of a pencil eraser. E for evolution:  The spot keeps changing in size, shape, and color.  Please check your skin once per month between visits. You can use a small mirror in front and a large mirror behind you to keep an eye on the back side or your body.   If you see any new or changing lesions before your next follow-up, please call to schedule a visit.  Please continue daily skin protection including broad spectrum sunscreen SPF 30+ to sun-exposed areas, reapplying every 2 hours as needed when you're outdoors.    If you have any questions or concerns for your doctor, please call our main line at [phone] press option 4 to reach your doctor's medical assistant. If no one answers, please leave a voicemail as directed and we will return your call as soon as possible. Messages left after 4 pm will be answered the following business day.   You may also send [place] message via [place] We typically respond to MyChart messages within 1-2 business days.  For prescription refills, please ask your pharmacy to contact our office. Our fax number is [phone]  If you have an urgent issue when the clinic is closed  that cannot wait until the next business day, you can page your doctor at the number below.    Please note that while we do our best to be available for urgent issues outside of office hours, we are not available 24/7.   If you have an urgent issue and are unable to reach [place], you may choose to seek medical care at your doctor's office, retail clinic, urgent care center, or emergency room.  If you have a medical emergency, please immediately call 911 or go to the emergency department.  Pager Numbers  - Dr. Nehemiah Massed: [phone]  - Dr. Laurence Ferrari: [phone]  - Dr. Nicole Kindred: [phone]  In the event of  inclement weather, please call our main line at [phone] for an update on the status of any delays or closures.  Dermatology Medication Tips: Please keep the boxes that topical medications come in in order to help keep track of the instructions about where and how to use these. Pharmacies typically print the medication instructions only on the boxes and not directly on the medication tubes.   If your medication is too expensive, please contact our office at [phone] option 4 or send [place] a message through [facility].   We are unable to tell what your co-pay for medications will be in advance as this is different depending on your insurance coverage. However, we may be able to find a substitute medication at lower cost or fill out paperwork to get insurance to cover a needed medication.   If a prior authorization is required to get your medication covered by your insurance company, please allow [place] 1-2 business days to complete this process.  Drug prices often vary depending on where the prescription is filled and some pharmacies may offer cheaper prices.  The website www.goodrx.com contains coupons for medications through different pharmacies. The prices here do not account for what the cost may be with help from insurance (it may be cheaper with your insurance), but the website can give you the price if you did not use any insurance.  - You can print the associated coupon and take it with your prescription to the pharmacy.  - You may also stop by our office during regular business hours and pick up a GoodRx coupon card.  - If you need your prescription sent electronically to a different pharmacy, notify our office through [facility] or by phone at [phone] option 4.  Recommend taking Heliocare sun protection supplement daily in sunny weather for additional sun protection. For maximum protection on the sunniest days, you can take up to 2 capsules of regular Heliocare OR take 1  capsule of Heliocare Ultra. For prolonged exposure (such as a full day in the sun), you can repeat your dose of the supplement 4 hours after your first dose. Heliocare can be purchased at [facility] or at [email or web address].

## 2021-09-14 NOTE — Progress Notes (Signed)
   New Patient Visit  Subjective  Angela Pacheco is a 55 y.o. female who presents for the following: TBSE (Patient here for full body skin exam and skin cancer screening. Patient does have fhx of melanoma, patient's father. No personal hx of skin cancer but patient has had dysplastic nevi. No new or changing spots that patient is aware of. ).    The following portions of the chart were reviewed this encounter and updated as appropriate:       Review of Systems:  No other skin or systemic complaints except as noted in HPI or Assessment and Plan.  Objective  Well appearing patient in no apparent distress; mood and affect are within normal limits.  A full examination was performed including scalp, head, eyes, ears, nose, lips, neck, chest, axillae, abdomen, back, buttocks, bilateral upper extremities, bilateral lower extremities, hands, feet, fingers, toes, fingernails, and toenails. All findings within normal limits unless otherwise noted below.    Assessment & Plan   No follow-ups on file.  Graciella Belton, RMA, am acting as scribe for Brendolyn Patty, MD .

## 2021-09-30 ENCOUNTER — Telehealth: Payer: Self-pay

## 2021-09-30 NOTE — Telephone Encounter (Signed)
Medical records from [facility] in history. You will find these under chart review and then under media.

## 2021-10-01 ENCOUNTER — Encounter: Payer: BC Managed Care – PPO | Admitting: Internal Medicine

## 2021-10-06 ENCOUNTER — Other Ambulatory Visit: Payer: Self-pay

## 2021-10-06 ENCOUNTER — Ambulatory Visit (INDEPENDENT_AMBULATORY_CARE_PROVIDER_SITE_OTHER): Payer: BC Managed Care – PPO | Admitting: Internal Medicine

## 2021-10-06 VITALS — BP 110/68 | HR 81 | Temp 98.0°F | Resp 16 | Ht 60.0 in | Wt 134.8 lb

## 2021-10-06 DIAGNOSIS — J4520 Mild intermittent asthma, uncomplicated: Secondary | ICD-10-CM | POA: Diagnosis not present

## 2021-10-06 DIAGNOSIS — M7542 Impingement syndrome of left shoulder: Secondary | ICD-10-CM

## 2021-10-06 DIAGNOSIS — Z13220 Encounter for screening for lipoid disorders: Secondary | ICD-10-CM

## 2021-10-06 DIAGNOSIS — E039 Hypothyroidism, unspecified: Secondary | ICD-10-CM

## 2021-10-06 DIAGNOSIS — F439 Reaction to severe stress, unspecified: Secondary | ICD-10-CM

## 2021-10-06 DIAGNOSIS — N951 Menopausal and female climacteric states: Secondary | ICD-10-CM

## 2021-10-06 LAB — CBC WITH DIFFERENTIAL/PLATELET
Basophils Absolute: 0.1 10*3/uL (ref 0.0–0.1)
Basophils Relative: 0.7 % (ref 0.0–3.0)
Eosinophils Absolute: 0.3 10*3/uL (ref 0.0–0.7)
Eosinophils Relative: 4.1 % (ref 0.0–5.0)
HCT: 44.4 % (ref 36.0–46.0)
Hemoglobin: 14.9 g/dL (ref 12.0–15.0)
Lymphocytes Relative: 29 % (ref 12.0–46.0)
Lymphs Abs: 2.1 10*3/uL (ref 0.7–4.0)
MCHC: 33.6 g/dL (ref 30.0–36.0)
MCV: 92.5 fl (ref 78.0–100.0)
Monocytes Absolute: 0.5 10*3/uL (ref 0.1–1.0)
Monocytes Relative: 6.5 % (ref 3.0–12.0)
Neutro Abs: 4.3 10*3/uL (ref 1.4–7.7)
Neutrophils Relative %: 59.7 % (ref 43.0–77.0)
Platelets: 325 10*3/uL (ref 150.0–400.0)
RBC: 4.81 Mil/uL (ref 3.87–5.11)
RDW: 13.4 % (ref 11.5–15.5)
WBC: 7.3 10*3/uL (ref 4.0–10.5)

## 2021-10-06 LAB — LIPID PANEL
Cholesterol: 182 mg/dL (ref 0–200)
HDL: 59.9 mg/dL (ref >39.00)
LDL Cholesterol: 106 mg/dL — ABNORMAL HIGH (ref 0–99)
NonHDL: 122.17
Total CHOL/HDL Ratio: 3
Triglycerides: 83 mg/dL (ref 0.0–149.0)
VLDL: 16.6 mg/dL (ref 0.0–40.0)

## 2021-10-06 LAB — COMPREHENSIVE METABOLIC PANEL
ALT: 25 U/L (ref 0–35)
AST: 21 U/L (ref 0–37)
Albumin: 4.2 g/dL (ref 3.5–5.2)
Alkaline Phosphatase: 66 U/L (ref 39–117)
BUN: 17 mg/dL (ref 6–23)
CO2: 25 mEq/L (ref 19–32)
Calcium: 9.5 mg/dL (ref 8.4–10.5)
Chloride: 107 mEq/L (ref 96–112)
Creatinine, Ser: 0.86 mg/dL (ref 0.40–1.20)
GFR: 77.05 mL/min (ref >60.00)
Glucose, Bld: 87 mg/dL (ref 70–99)
Potassium: 4 mEq/L (ref 3.5–5.1)
Sodium: 141 mEq/L (ref 135–145)
Total Bilirubin: 0.5 mg/dL (ref 0.2–1.2)
Total Protein: 6.7 g/dL (ref 6.0–8.3)

## 2021-10-06 LAB — TSH: TSH: 0.96 u[IU]/mL (ref 0.35–5.50)

## 2021-10-06 NOTE — Progress Notes (Signed)
Patient ID: Angela Pacheco, female   DOB: Apr 20, 1966, 55 y.o.   MRN: [id number]   Subjective:    Patient ID: Angela Pacheco, female    DOB: [birth year], 55 y.o.   MRN: [id number]  This visit occurred during the SARS-CoV-2 public health emergency.  Safety protocols were in place, including screening questions prior to the visit, additional usage of staff PPE, and extensive cleaning of exam room while observing appropriate contact time as indicated for disinfecting solutions.   Patient here for physical exam.  Sees gyn, so visit changed to f/u appt.     HPI Here to follow up regarding increased stress. Asthma and her thyroid.  Increased stress recently with work.  Discussed.  Has team support at school.  No chest pain or sob reported.  No abdominal pain or bowel change reported.  Seeing Dr Leafy Ro.  Prescribed vaginal estrogen.  Also evaluated for urge incontinence.  Discussed pelvic floor exercises.  Has seen ortho for her shoulder.  Persistent problems. Possible surgery - after Thanksgiving.     Past Medical History:  Diagnosis Date   Allergy    cats trigger   Asthma    mild   B12 deficiency 27/78/2423   Complication of anesthesia    Dysplastic nevus 11/01/2017   Right middle shoulder, mod, excision by Dr Phillip Heal   Dysplastic nevus 07/03/2020   L medial scapular back, lat to T5, moderate, excision by Dr Phillip Heal   Dysplastic nevus 06/09/2020   L scapular back lateral, mild   Hypothyroidism    Thyroid disease    Past Surgical History:  Procedure Laterality Date   ABDOMINAL HYSTERECTOMY     cervix left in place   APPENDECTOMY  1991   CHOLECYSTECTOMY     COLONOSCOPY WITH PROPOFOL N/A 10/08/2019   Procedure: COLONOSCOPY WITH PROPOFOL;  Surgeon: Lin Landsman, MD;  Location: [facility] ENDOSCOPY;  Service: Gastroenterology;  Laterality: N/A;   Family History  Problem Relation Age of Onset   Arthritis Mother    Breast cancer Neg Hx    Social History   Socioeconomic  History   Marital status: Married    Spouse name: Not on file   Number of children: Not on file   Years of education: Not on file   Highest education level: Not on file  Occupational History   Not on file  Tobacco Use   Smoking status: Never   Smokeless tobacco: Never  Substance and Sexual Activity   Alcohol use: Yes    Alcohol/week: 0.0 standard drinks    Comment: occ.   Drug use: No   Sexual activity: Not on file  Other Topics Concern   Not on file  Social History Narrative   Not on file   Social Determinants of Health   Financial Resource Strain: Not on file  Food Insecurity: Not on file  Transportation Needs: Not on file  Physical Activity: Not on file  Stress: Not on file  Social Connections: Not on file     Review of Systems  Constitutional:  Negative for appetite change and unexpected weight change.  HENT:  Negative for congestion and sinus pressure.   Respiratory:  Negative for cough, chest tightness and shortness of breath.   Cardiovascular:  Negative for chest pain, palpitations and leg swelling.  Gastrointestinal:  Negative for abdominal pain, diarrhea, nausea and vomiting.  Genitourinary:  Negative for difficulty urinating and dysuria.  Musculoskeletal:  Negative for joint swelling and myalgias.  Skin:  Negative for color  change and rash.  Neurological:  Negative for dizziness, light-headedness and headaches.  Psychiatric/Behavioral:  Negative for agitation and dysphoric mood.       Objective:     BP 110/68   Pulse 81   Temp 98 F (36.7 C)   Resp 16   Ht 5' (1.524 m)   Wt 134 lb 12.8 oz (61.1 kg)   SpO2 98%   BMI 26.33 kg/m  Wt Readings from Last 3 Encounters:  10/06/21 134 lb 12.8 oz (61.1 kg)  04/01/21 134 lb 12.8 oz (61.1 kg)  10/01/20 135 lb (61.2 kg)    Physical Exam Vitals reviewed.  Constitutional:      General: She is not in acute distress.    Appearance: Normal appearance.  HENT:     Head: Normocephalic and atraumatic.      Right Ear: External ear normal.     Left Ear: External ear normal.  Eyes:     General: No scleral icterus.       Right eye: No discharge.        Left eye: No discharge.     Conjunctiva/sclera: Conjunctivae normal.  Neck:     Thyroid: No thyromegaly.  Cardiovascular:     Rate and Rhythm: Normal rate and regular rhythm.  Pulmonary:     Effort: No respiratory distress.     Breath sounds: Normal breath sounds. No wheezing.  Abdominal:     General: Bowel sounds are normal.     Palpations: Abdomen is soft.     Tenderness: There is no abdominal tenderness.  Musculoskeletal:        General: No swelling or tenderness.     Cervical back: Neck supple. No tenderness.  Lymphadenopathy:     Cervical: No cervical adenopathy.  Skin:    Findings: No erythema or rash.  Neurological:     Mental Status: She is alert.  Psychiatric:        Mood and Affect: Mood normal.        Behavior: Behavior normal.     Outpatient Encounter Medications as of 10/06/2021  Medication Sig   albuterol (VENTOLIN HFA) 108 (90 Base) MCG/ACT inhaler INHALE 2 PUFFS INTO THE LUNGS EVERY 6 HOURS AS NEEDED FOR WHEEZING OR SHORTNESS OF BREATH   Calcium 600-200 MG-UNIT tablet Take 1 tablet by mouth 2 (two) times daily with a meal.   cyanocobalamin (,VITAMIN B-12,) 1000 MCG/ML injection INJECT 1 ML IN THE MUSCLE EVERY 30 DAYS   fluticasone (FLONASE) 50 MCG/ACT nasal spray Place 2 sprays into both nostrils daily.   levothyroxine (SYNTHROID) 100 MCG tablet TAKE 1 TABLET(100 MCG) BY MOUTH DAILY   SYRINGE-NEEDLE, DISP, 3 ML (BD INTEGRA SYRINGE) 25G X 1" 3 ML MISC Use as directed with B12 injections   Triamcinolone Acetonide (NASACORT AQ NA) Place into the nose.   [DISCONTINUED] amoxicillin-clavulanate (AUGMENTIN) 875-125 MG tablet Take 1 tablet by mouth 2 (two) times daily.   [DISCONTINUED] meloxicam (MOBIC) 15 MG tablet TAKE 1 TABLET(15 MG) BY MOUTH DAILY   No facility-administered encounter medications on file as of  10/06/2021.     Lab Results  Component Value Date   WBC 7.3 10/06/2021   HGB 14.9 10/06/2021   HCT 44.4 10/06/2021   PLT 325.0 10/06/2021   GLUCOSE 87 10/06/2021   CHOL 182 10/06/2021   TRIG 83.0 10/06/2021   HDL 59.90 10/06/2021   LDLCALC 106 (H) 10/06/2021   ALT 25 10/06/2021   AST 21 10/06/2021   NA 141 10/06/2021  K 4.0 10/06/2021   CL 107 10/06/2021   CREATININE 0.86 10/06/2021   BUN 17 10/06/2021   CO2 25 10/06/2021   TSH 0.96 10/06/2021    MM 3D SCREEN BREAST BILATERAL  Result Date: 07/23/2021 CLINICAL DATA:  Screening. EXAM: DIGITAL SCREENING BILATERAL MAMMOGRAM WITH TOMOSYNTHESIS AND CAD TECHNIQUE: Bilateral screening digital craniocaudal and mediolateral oblique mammograms were obtained. Bilateral screening digital breast tomosynthesis was performed. The images were evaluated with computer-aided detection. COMPARISON:  Previous exam(s). ACR Breast Density Category b: There are scattered areas of fibroglandular density. FINDINGS: There are no findings suspicious for malignancy. IMPRESSION: No mammographic evidence of malignancy. A result letter of this screening mammogram will be mailed directly to the patient. RECOMMENDATION: Screening mammogram in one year. (Code:SM-B-01Y) BI-RADS CATEGORY  1: Negative. Electronically Signed   By: Lillia Mountain M.D.   On: 07/23/2021 14:56      Assessment & Plan:   Problem List Items Addressed This Visit     Asthma - Primary    Breathing stable.       Relevant Orders   CBC with Differential/Platelet (Completed)   Hypothyroidism    On thyroid replacement.  Follow tsh.        Relevant Orders   Comprehensive metabolic panel (Completed)   TSH (Completed)   Impingement syndrome of left shoulder region    Saw ortho.  Question of surgery.  Continue f/u with ortho.       Menopausal symptoms    Seeing gyn.  Vaginal estrogen.  Follow       Stress    Increased stress.  Discussed.  Follow.  Notify me if feels needs further  intervention.  Follow.       Other Visit Diagnoses     Screening cholesterol level       Relevant Orders   Lipid panel (Completed)        [name], MD

## 2021-10-11 ENCOUNTER — Encounter: Payer: Self-pay | Admitting: Internal Medicine

## 2021-10-11 DIAGNOSIS — F439 Reaction to severe stress, unspecified: Secondary | ICD-10-CM | POA: Insufficient documentation

## 2021-10-11 NOTE — Assessment & Plan Note (Signed)
Breathing stable.

## 2021-10-11 NOTE — Assessment & Plan Note (Signed)
On thyroid replacement.  Follow tsh.  

## 2021-10-11 NOTE — Assessment & Plan Note (Signed)
Increased stress.  Discussed.  Follow.  Notify me if feels needs further intervention.  Follow.

## 2021-10-11 NOTE — Assessment & Plan Note (Signed)
Saw ortho.  Question of surgery.  Continue f/u with ortho.

## 2021-10-11 NOTE — Assessment & Plan Note (Signed)
Seeing gyn.  Vaginal estrogen.  Follow

## 2021-10-17 ENCOUNTER — Other Ambulatory Visit: Payer: Self-pay | Admitting: Internal Medicine

## 2021-10-19 ENCOUNTER — Other Ambulatory Visit: Payer: Self-pay

## 2021-10-19 ENCOUNTER — Ambulatory Visit: Payer: BC Managed Care – PPO | Attending: Obstetrics and Gynecology | Admitting: Physical Therapy

## 2021-10-19 DIAGNOSIS — M6281 Muscle weakness (generalized): Secondary | ICD-10-CM | POA: Insufficient documentation

## 2021-10-19 DIAGNOSIS — R278 Other lack of coordination: Secondary | ICD-10-CM | POA: Insufficient documentation

## 2021-10-19 NOTE — Therapy (Signed)
[facility] [address] [place], [place], [address] Phone: [phone]   Fax:  [phone]  Patient Details  Name: Angela Pacheco MRN: [id number] Date of Birth: Feb 06, 1966 Referring Provider:  Benjaman Kindler, MD  Encounter Date: 10/19/2021   Encounter created in error. Patient presented to clinic for evaluation of UI, but upon discussion of insurance coverage and potential costs, expressed desire to reschedule evaluation until patient can gain clarification from insurer. Patient r/s for evaluation on 10/26/2021 and in agreement to contact clinic should she decide to defer treatment at this time.    Myles Gip PT, DPT [phone]  10/19/2021, 4:54 PM  [facility] [address] [place], [place], [address] Phone: [phone]   Fax:  [phone]

## 2021-10-26 ENCOUNTER — Ambulatory Visit: Payer: BC Managed Care – PPO | Admitting: Physical Therapy

## 2021-11-02 ENCOUNTER — Encounter: Payer: BC Managed Care – PPO | Admitting: Physical Therapy

## 2021-11-09 ENCOUNTER — Encounter: Payer: BC Managed Care – PPO | Admitting: Physical Therapy

## 2021-11-16 ENCOUNTER — Encounter: Payer: BC Managed Care – PPO | Admitting: Physical Therapy

## 2021-11-20 ENCOUNTER — Other Ambulatory Visit: Payer: Self-pay | Admitting: Internal Medicine

## 2021-11-23 ENCOUNTER — Encounter: Payer: BC Managed Care – PPO | Admitting: Physical Therapy

## 2021-11-24 DIAGNOSIS — Z9889 Other specified postprocedural states: Secondary | ICD-10-CM | POA: Insufficient documentation

## 2021-12-12 ENCOUNTER — Other Ambulatory Visit: Payer: Self-pay | Admitting: Internal Medicine

## 2022-01-19 ENCOUNTER — Other Ambulatory Visit: Payer: Self-pay

## 2022-01-19 ENCOUNTER — Encounter: Payer: Self-pay | Admitting: Internal Medicine

## 2022-01-19 ENCOUNTER — Ambulatory Visit: Payer: BC Managed Care – PPO | Admitting: Internal Medicine

## 2022-01-19 VITALS — BP 104/68 | HR 81 | Temp 98.0°F | Resp 16 | Ht 60.0 in | Wt 138.0 lb

## 2022-01-19 DIAGNOSIS — M25519 Pain in unspecified shoulder: Secondary | ICD-10-CM

## 2022-01-19 DIAGNOSIS — Z13220 Encounter for screening for lipoid disorders: Secondary | ICD-10-CM | POA: Diagnosis not present

## 2022-01-19 DIAGNOSIS — F439 Reaction to severe stress, unspecified: Secondary | ICD-10-CM

## 2022-01-19 DIAGNOSIS — J4520 Mild intermittent asthma, uncomplicated: Secondary | ICD-10-CM

## 2022-01-19 DIAGNOSIS — Z114 Encounter for screening for human immunodeficiency virus [HIV]: Secondary | ICD-10-CM

## 2022-01-19 DIAGNOSIS — R5383 Other fatigue: Secondary | ICD-10-CM

## 2022-01-19 DIAGNOSIS — E039 Hypothyroidism, unspecified: Secondary | ICD-10-CM

## 2022-01-19 DIAGNOSIS — Z1159 Encounter for screening for other viral diseases: Secondary | ICD-10-CM | POA: Diagnosis not present

## 2022-01-19 DIAGNOSIS — K219 Gastro-esophageal reflux disease without esophagitis: Secondary | ICD-10-CM

## 2022-01-19 DIAGNOSIS — H9190 Unspecified hearing loss, unspecified ear: Secondary | ICD-10-CM

## 2022-01-19 NOTE — Progress Notes (Signed)
Patient ID: Abegail Kloeppel, female   DOB: 1966/11/26, 56 y.o.   MRN: [id number]   Subjective:    Patient ID: [name], female    DOB: [birth year], 56 y.o.   MRN: [id number]  This visit occurred during the SARS-CoV-2 public health emergency.  Safety protocols were in place, including screening questions prior to the visit, additional usage of staff PPE, and extensive cleaning of exam room while observing appropriate contact time as indicated for disinfecting solutions.   Patient here for a scheduled followup.   Chief Complaint  Patient presents with   Hypothyroidism   Sleep Disturbance   Decreased Hearing   .   HPI Is s/p shoulder surgery 09/2021.  Doing relatively well.  Still with some pain.  Going through PT.  Increased pain after PT last week. Starting back exercising - walking and biking. Breathing overall stable.  No chest pain or sob reported.  No abdominal pain or bowel change reported.  Increased stress with work.  Discussed.  Will let me know if feels needs any further intervention. Saw her dentist.  Concern regarding sleep apnea.  Discussed referral for HST.  Also has noticed decreased hearing.  Request referral for audiology evaluation.     Past Medical History:  Diagnosis Date   Allergy    cats trigger   Asthma    mild   B12 deficiency 83/41/9622   Complication of anesthesia    Dysplastic nevus 11/01/2017   Right middle shoulder, mod, excision by Dr Phillip Heal   Dysplastic nevus 07/03/2020   L medial scapular back, lat to T5, moderate, excision by Dr Phillip Heal   Dysplastic nevus 06/09/2020   L scapular back lateral, mild   Hypothyroidism    Thyroid disease    Past Surgical History:  Procedure Laterality Date   ABDOMINAL HYSTERECTOMY     cervix left in place   APPENDECTOMY  1991   CHOLECYSTECTOMY     COLONOSCOPY WITH PROPOFOL N/A 10/08/2019   Procedure: COLONOSCOPY WITH PROPOFOL;  Surgeon: Lin Landsman, MD;  Location: [facility] ENDOSCOPY;  Service:  Gastroenterology;  Laterality: N/A;   Family History  Problem Relation Age of Onset   Arthritis Mother    Breast cancer Neg Hx    Social History   Socioeconomic History   Marital status: Married    Spouse name: Not on file   Number of children: Not on file   Years of education: Not on file   Highest education level: Not on file  Occupational History   Not on file  Tobacco Use   Smoking status: Never   Smokeless tobacco: Never  Substance and Sexual Activity   Alcohol use: Yes    Alcohol/week: 0.0 standard drinks    Comment: occ.   Drug use: No   Sexual activity: Not on file  Other Topics Concern   Not on file  Social History Narrative   Not on file   Social Determinants of Health   Financial Resource Strain: Not on file  Food Insecurity: Not on file  Transportation Needs: Not on file  Physical Activity: Not on file  Stress: Not on file  Social Connections: Not on file     Review of Systems  Constitutional:  Negative for appetite change and unexpected weight change.  HENT:  Negative for congestion and sinus pressure.   Respiratory:  Negative for cough, chest tightness and shortness of breath.   Cardiovascular:  Negative for chest pain, palpitations and leg swelling.  Gastrointestinal:  Negative  for abdominal pain, diarrhea, nausea and vomiting.  Genitourinary:  Negative for difficulty urinating and dysuria.  Musculoskeletal:  Negative for myalgias.       Recent shoulder surgery.  Followed by ortho   Skin:  Negative for color change and rash.  Neurological:  Negative for dizziness, light-headedness and headaches.  Psychiatric/Behavioral:  Negative for agitation and dysphoric mood.       Objective:     BP 104/68    Pulse 81    Temp 98 F (36.7 C)    Resp 16    Ht 5' (1.524 m)    Wt 138 lb (62.6 kg)    SpO2 97%    BMI 26.95 kg/m  Wt Readings from Last 3 Encounters:  01/19/22 138 lb (62.6 kg)  10/06/21 134 lb 12.8 oz (61.1 kg)  04/01/21 134 lb 12.8 oz (61.1  kg)    Physical Exam Vitals reviewed.  Constitutional:      General: She is not in acute distress.    Appearance: Normal appearance.  HENT:     Head: Normocephalic and atraumatic.     Right Ear: External ear normal.     Left Ear: External ear normal.  Eyes:     General: No scleral icterus.       Right eye: No discharge.        Left eye: No discharge.     Conjunctiva/sclera: Conjunctivae normal.  Neck:     Thyroid: No thyromegaly.  Cardiovascular:     Rate and Rhythm: Normal rate and regular rhythm.  Pulmonary:     Effort: No respiratory distress.     Breath sounds: Normal breath sounds. No wheezing.  Abdominal:     General: Bowel sounds are normal.     Palpations: Abdomen is soft.     Tenderness: There is no abdominal tenderness.  Musculoskeletal:        General: No swelling or tenderness.     Cervical back: Neck supple. No tenderness.  Lymphadenopathy:     Cervical: No cervical adenopathy.  Skin:    Findings: No erythema or rash.  Neurological:     Mental Status: She is alert.  Psychiatric:        Mood and Affect: Mood normal.        Behavior: Behavior normal.     Outpatient Encounter Medications as of 01/19/2022  Medication Sig   albuterol (VENTOLIN HFA) 108 (90 Base) MCG/ACT inhaler INHALE 2 PUFFS INTO THE LUNGS EVERY 6 HOURS AS NEEDED FOR WHEEZING OR SHORTNESS OF BREATH   Calcium 600-200 MG-UNIT tablet Take 1 tablet by mouth 2 (two) times daily with a meal.   cyanocobalamin (,VITAMIN B-12,) 1000 MCG/ML injection INJECT 1 ML IN THE MUSCLE EVERY 30 DAYS   levothyroxine (SYNTHROID) 100 MCG tablet TAKE 1 TABLET(100 MCG) BY MOUTH DAILY   SYRINGE-NEEDLE, DISP, 3 ML (BD INTEGRA SYRINGE) 25G X 1" 3 ML MISC Use as directed with B12 injections   Triamcinolone Acetonide (NASACORT AQ NA) Place into the nose.   [DISCONTINUED] fluticasone (FLONASE) 50 MCG/ACT nasal spray Place 2 sprays into both nostrils daily.   No facility-administered encounter medications on file as of  01/19/2022.     Lab Results  Component Value Date   WBC 7.3 10/06/2021   HGB 14.9 10/06/2021   HCT 44.4 10/06/2021   PLT 325.0 10/06/2021   GLUCOSE 87 10/06/2021   CHOL 182 10/06/2021   TRIG 83.0 10/06/2021   HDL 59.90 10/06/2021   LDLCALC 106 (H) 10/06/2021  ALT 25 10/06/2021   AST 21 10/06/2021   NA 141 10/06/2021   K 4.0 10/06/2021   CL 107 10/06/2021   CREATININE 0.86 10/06/2021   BUN 17 10/06/2021   CO2 25 10/06/2021   TSH 0.96 10/06/2021    MM 3D SCREEN BREAST BILATERAL  Result Date: 07/23/2021 CLINICAL DATA:  Screening. EXAM: DIGITAL SCREENING BILATERAL MAMMOGRAM WITH TOMOSYNTHESIS AND CAD TECHNIQUE: Bilateral screening digital craniocaudal and mediolateral oblique mammograms were obtained. Bilateral screening digital breast tomosynthesis was performed. The images were evaluated with computer-aided detection. COMPARISON:  Previous exam(s). ACR Breast Density Category b: There are scattered areas of fibroglandular density. FINDINGS: There are no findings suspicious for malignancy. IMPRESSION: No mammographic evidence of malignancy. A result letter of this screening mammogram will be mailed directly to the patient. RECOMMENDATION: Screening mammogram in one year. (Code:SM-B-01Y) BI-RADS CATEGORY  1: Negative. Electronically Signed   By: Lillia Mountain M.D.   On: 07/23/2021 14:56      Assessment & Plan:   Problem List Items Addressed This Visit     Asthma    Breathing stable.       Decreased hearing    Decreased hearing and tinnitus. Refer to ent/audiology for evaluation.  Request [facility]       Relevant Orders   Ambulatory referral to ENT   Fatigue    Concern regarding sleep apnea as outlined.  Refer to pulmonary for further evaluation.        Relevant Orders   Ambulatory referral to Pulmonology   GERD (gastroesophageal reflux disease)    pepcid as directed.  Follow.  Notify me if persistent.       Hypothyroidism    On thyroid replacement.  Follow tsh.        Shoulder pain    S/p surgery.  PT.  Follow.       Stress    Increased stress.  Discussed.  Follow.  Notify me if feels needs further intervention.  Follow.       Other Visit Diagnoses     Screening cholesterol level    -  Primary   Relevant Orders   Comprehensive metabolic panel   Lipid panel   Encounter for hepatitis C screening test for low risk patient       Relevant Orders   Hepatitis C antibody   Screening for HIV without presence of risk factors       Relevant Orders   HIV Antibody (routine testing w rflx)        [name], MD

## 2022-01-19 NOTE — Patient Instructions (Signed)
Add pepcid 20mg  - take 30 minutes before evening meal

## 2022-01-21 ENCOUNTER — Encounter: Payer: Self-pay | Admitting: Internal Medicine

## 2022-01-24 ENCOUNTER — Encounter: Payer: Self-pay | Admitting: Internal Medicine

## 2022-01-24 DIAGNOSIS — R5383 Other fatigue: Secondary | ICD-10-CM | POA: Insufficient documentation

## 2022-01-24 DIAGNOSIS — K219 Gastro-esophageal reflux disease without esophagitis: Secondary | ICD-10-CM | POA: Insufficient documentation

## 2022-01-24 DIAGNOSIS — H9190 Unspecified hearing loss, unspecified ear: Secondary | ICD-10-CM | POA: Insufficient documentation

## 2022-01-24 NOTE — Assessment & Plan Note (Signed)
S/p surgery.  PT.  Follow.

## 2022-01-24 NOTE — Assessment & Plan Note (Signed)
pepcid as directed.  Follow.  Notify me if persistent.

## 2022-01-24 NOTE — Assessment & Plan Note (Signed)
Increased stress.  Discussed.  Follow.  Notify me if feels needs further intervention.  Follow.

## 2022-01-24 NOTE — Assessment & Plan Note (Signed)
Breathing stable.

## 2022-01-24 NOTE — Assessment & Plan Note (Signed)
Concern regarding sleep apnea as outlined.  Refer to pulmonary for further evaluation.

## 2022-01-24 NOTE — Assessment & Plan Note (Signed)
Decreased hearing and tinnitus. Refer to ent/audiology for evaluation.  Request [facility]

## 2022-01-24 NOTE — Assessment & Plan Note (Signed)
On thyroid replacement.  Follow tsh.  

## 2022-01-25 NOTE — Telephone Encounter (Signed)
LMTCB

## 2022-01-26 NOTE — Telephone Encounter (Signed)
Offered patient appt today. Pt unable to do appt time due to work. Scheduled for Friday. Pt stated that would work best for her

## 2022-01-29 ENCOUNTER — Telehealth: Payer: BC Managed Care – PPO | Admitting: Internal Medicine

## 2022-01-29 ENCOUNTER — Encounter: Payer: Self-pay | Admitting: Internal Medicine

## 2022-01-29 NOTE — Telephone Encounter (Signed)
Offered to switch her appt to 4:00 today. Was not able to do so. Can we add her at 4 one day next week ? If so, is there a specific day?

## 2022-01-29 NOTE — Telephone Encounter (Signed)
See other note

## 2022-01-29 NOTE — Telephone Encounter (Signed)
I can work her in Monday if she wants to come in Monday

## 2022-01-29 NOTE — Telephone Encounter (Signed)
Pt called in to cancel appt. Try to offer Pt appt time and date. Pt stated that she couldn't make appt time and date that was offered. Pt requested for message to be sent to you so you can find a date and time for Pt. Pt requesting callback.

## 2022-02-01 NOTE — Telephone Encounter (Signed)
LMTCB. My chart sent Friday as well. Can schedule appt at 4:00 02/01/2022 if able.

## 2022-02-02 NOTE — Telephone Encounter (Signed)
Called patient and offered multiple appts. Patient is unable to do any of them due to her work schedule/conferences. She is looking for recommendations of therapists that she could get established with on her own so she can work around her schedule. Are you ok with me sending her the list of therapists and then do a follow up with her at a later date?

## 2022-02-02 NOTE — Telephone Encounter (Signed)
If she is ok with this, I am ok.  I can also work her in at 7:00 if she needs

## 2022-02-03 NOTE — Telephone Encounter (Signed)
Ok

## 2022-02-03 NOTE — Telephone Encounter (Signed)
I mailed her the list of therapists. Ok to add her on at 7:00 on Monday?

## 2022-02-08 ENCOUNTER — Telehealth (INDEPENDENT_AMBULATORY_CARE_PROVIDER_SITE_OTHER): Payer: BC Managed Care – PPO | Admitting: Internal Medicine

## 2022-02-08 DIAGNOSIS — J4520 Mild intermittent asthma, uncomplicated: Secondary | ICD-10-CM | POA: Diagnosis not present

## 2022-02-08 DIAGNOSIS — F439 Reaction to severe stress, unspecified: Secondary | ICD-10-CM | POA: Diagnosis not present

## 2022-02-08 NOTE — Progress Notes (Signed)
Patient ID: Angela Pacheco, female   DOB: Jan 04, 1966, 56 y.o.   MRN: [id number]   Virtual Visit via video Note  This visit type was conducted due to national recommendations for restrictions regarding the COVID-19 pandemic (e.g. social distancing).  This format is felt to be most appropriate for this patient at this time.  All issues noted in this document were discussed and addressed.  No physical exam was performed (except for noted visual exam findings with Video Visits).   I connected with Angela Pacheco by a video enabled telemedicine application or telephone and verified that I am speaking with the correct person using two identifiers. Location patient: home Location provider: work Persons participating in the virtual visit: patient, provider  The limitations, risks, security and privacy concerns of performing an evaluation and management service by video and the availability of in person appointments have been discussed. It has also been discussed with the patient that there may be a patient responsible charge related to this service. The patient expressed understanding and agreed to proceed.   Reason for visit: work in appt  HPI: Work in - increased stress - stress at work.  Incident with a Ship broker.  Left arm - aggravated.  States delayed recovery.  Took a couple of days away from school.  Increased stress with persistent issues with her work.  Names of therapist mailed to her.  She has reached out to [facility].  Apparently full.  Has name - Angela Pacheco.  Plans to f/u to get an appt.  She has started a Bible study.  Is journaling.  Exercising.  Breathing stable.  No increased cough or congestion.  No nausea or vomiting.     ROS: See pertinent positives and negatives per HPI.  Past Medical History:  Diagnosis Date   Allergy    cats trigger   Asthma    mild   B12 deficiency 70/62/3762   Complication of anesthesia    Dysplastic nevus 11/01/2017   Right middle shoulder,  mod, excision by Dr Phillip Heal   Dysplastic nevus 07/03/2020   L medial scapular back, lat to T5, moderate, excision by Dr Phillip Heal   Dysplastic nevus 06/09/2020   L scapular back lateral, mild   Hypothyroidism    Thyroid disease     Past Surgical History:  Procedure Laterality Date   ABDOMINAL HYSTERECTOMY     cervix left in place   APPENDECTOMY  1991   CHOLECYSTECTOMY     COLONOSCOPY WITH PROPOFOL N/A 10/08/2019   Procedure: COLONOSCOPY WITH PROPOFOL;  Surgeon: Lin Landsman, MD;  Location: [facility] ENDOSCOPY;  Service: Gastroenterology;  Laterality: N/A;    Family History  Problem Relation Age of Onset   Arthritis Mother    Breast cancer Neg Hx     SOCIAL HX: reviewed.    Current Outpatient Medications:    albuterol (VENTOLIN HFA) 108 (90 Base) MCG/ACT inhaler, INHALE 2 PUFFS INTO THE LUNGS EVERY 6 HOURS AS NEEDED FOR WHEEZING OR SHORTNESS OF BREATH, Disp: 18 g, Rfl: 1   Calcium 600-200 MG-UNIT tablet, Take 1 tablet by mouth 2 (two) times daily with a meal., Disp: , Rfl:    cyanocobalamin (,VITAMIN B-12,) 1000 MCG/ML injection, INJECT 1 ML IN THE MUSCLE EVERY 30 DAYS, Disp: 10 mL, Rfl: 0   levothyroxine (SYNTHROID) 100 MCG tablet, TAKE 1 TABLET(100 MCG) BY MOUTH DAILY, Disp: 30 tablet, Rfl: 5   SYRINGE-NEEDLE, DISP, 3 ML (BD INTEGRA SYRINGE) 25G X 1" 3 ML MISC, Use as directed  with B12 injections, Disp: 100 each, Rfl: 0   Triamcinolone Acetonide (NASACORT AQ NA), Place into the nose., Disp: , Rfl:   EXAM:  GENERAL: alert, oriented, appears well and in no acute distress  HEENT: atraumatic, conjunttiva clear, no obvious abnormalities on inspection of external nose and ears  NECK: normal movements of the head and neck  LUNGS: on inspection no signs of respiratory distress, breathing rate appears normal, no obvious gross SOB, gasping or wheezing  CV: no obvious cyanosis  PSYCH/NEURO: pleasant and cooperative, no obvious depression or anxiety, speech and thought processing  grossly intact  ASSESSMENT AND PLAN:  Discussed the following assessment and plan:  Problem List Items Addressed This Visit     Asthma    Breathing stable.       Stress    Increased stress. Discussed.  She has made some interventions on her own - Bible study, writing in her journal, exercise.  She is reaching out to schedule an appt with a counselor.  Discussed medication.  Wants to hold.  Follow.  Will continue to keep me updated.         Return if symptoms worsen or fail to improve.   I discussed the assessment and treatment plan with the patient. The patient was provided an opportunity to ask questions and all were answered. The patient agreed with the plan and demonstrated an understanding of the instructions.   The patient was advised to call back or seek an in-person evaluation if the symptoms worsen or if the condition fails to improve as anticipated.   [name], MD

## 2022-02-12 ENCOUNTER — Other Ambulatory Visit: Payer: Self-pay | Admitting: Internal Medicine

## 2022-02-13 ENCOUNTER — Encounter: Payer: Self-pay | Admitting: Internal Medicine

## 2022-02-13 NOTE — Assessment & Plan Note (Signed)
Breathing stable.

## 2022-02-13 NOTE — Assessment & Plan Note (Signed)
Increased stress. Discussed.  She has made some interventions on her own - Bible study, writing in her journal, exercise.  She is reaching out to schedule an appt with a counselor.  Discussed medication.  Wants to hold.  Follow.  Will continue to keep me updated.

## 2022-02-22 ENCOUNTER — Encounter: Payer: Self-pay | Admitting: Internal Medicine

## 2022-02-22 ENCOUNTER — Other Ambulatory Visit: Payer: Self-pay

## 2022-02-22 NOTE — Telephone Encounter (Signed)
Patient stated she is having the abdominal pain periodically like she did after her previous shoulder surgery that she discussed with PCP she is taking the Prilosec and prevacid as PCP advised is better today but would like to see PCP for recommendation scheduled for Friday at 7 AM. Patient stated she could not do 12:30 on Wednesday due top school hours and cannot get off.

## 2022-02-22 NOTE — Telephone Encounter (Signed)
Please call and confirm pt doing ok.  I can work her in this week.  Is she ok to wait until Wednesday - can work in at 12:30.

## 2022-02-26 ENCOUNTER — Other Ambulatory Visit: Payer: Self-pay

## 2022-02-26 ENCOUNTER — Ambulatory Visit (INDEPENDENT_AMBULATORY_CARE_PROVIDER_SITE_OTHER): Payer: BC Managed Care – PPO | Admitting: Internal Medicine

## 2022-02-26 ENCOUNTER — Encounter: Payer: Self-pay | Admitting: Internal Medicine

## 2022-02-26 DIAGNOSIS — J4520 Mild intermittent asthma, uncomplicated: Secondary | ICD-10-CM

## 2022-02-26 DIAGNOSIS — R1013 Epigastric pain: Secondary | ICD-10-CM

## 2022-02-26 DIAGNOSIS — K219 Gastro-esophageal reflux disease without esophagitis: Secondary | ICD-10-CM | POA: Diagnosis not present

## 2022-02-26 DIAGNOSIS — F439 Reaction to severe stress, unspecified: Secondary | ICD-10-CM

## 2022-02-26 LAB — BASIC METABOLIC PANEL
BUN: 17 mg/dL (ref 6–23)
CO2: 24 mEq/L (ref 19–32)
Calcium: 9.6 mg/dL (ref 8.4–10.5)
Chloride: 106 mEq/L (ref 96–112)
Creatinine, Ser: 0.86 mg/dL (ref 0.40–1.20)
GFR: 76.84 mL/min (ref >60.00)
Glucose, Bld: 89 mg/dL (ref 70–99)
Potassium: 4.2 mEq/L (ref 3.5–5.1)
Sodium: 143 mEq/L (ref 135–145)

## 2022-02-26 LAB — CBC WITH DIFFERENTIAL/PLATELET
Basophils Absolute: 0 10*3/uL (ref 0.0–0.1)
Basophils Relative: 0.4 % (ref 0.0–3.0)
Eosinophils Absolute: 0.2 10*3/uL (ref 0.0–0.7)
Eosinophils Relative: 2.7 % (ref 0.0–5.0)
HCT: 42.6 % (ref 36.0–46.0)
Hemoglobin: 14.5 g/dL (ref 12.0–15.0)
Lymphocytes Relative: 31.1 % (ref 12.0–46.0)
Lymphs Abs: 2.3 10*3/uL (ref 0.7–4.0)
MCHC: 34 g/dL (ref 30.0–36.0)
MCV: 91.7 fl (ref 78.0–100.0)
Monocytes Absolute: 0.5 10*3/uL (ref 0.1–1.0)
Monocytes Relative: 7 % (ref 3.0–12.0)
Neutro Abs: 4.3 10*3/uL (ref 1.4–7.7)
Neutrophils Relative %: 58.8 % (ref 43.0–77.0)
Platelets: 328 10*3/uL (ref 150.0–400.0)
RBC: 4.64 Mil/uL (ref 3.87–5.11)
RDW: 12.9 % (ref 11.5–15.5)
WBC: 7.3 10*3/uL (ref 4.0–10.5)

## 2022-02-26 LAB — AMYLASE: Amylase: 46 U/L (ref 27–131)

## 2022-02-26 LAB — HEPATIC FUNCTION PANEL
ALT: 52 U/L — ABNORMAL HIGH (ref 0–35)
AST: 25 U/L (ref 0–37)
Albumin: 4.4 g/dL (ref 3.5–5.2)
Alkaline Phosphatase: 73 U/L (ref 39–117)
Bilirubin, Direct: 0.1 mg/dL (ref 0.0–0.3)
Total Bilirubin: 0.7 mg/dL (ref 0.2–1.2)
Total Protein: 6.7 g/dL (ref 6.0–8.3)

## 2022-02-26 LAB — LIPASE: Lipase: 24 U/L (ref 11.0–59.0)

## 2022-02-26 MED ORDER — PANTOPRAZOLE SODIUM 40 MG PO TBEC: 40.0000 mg | DELAYED_RELEASE_TABLET | Freq: Two times a day (BID) | ORAL | 1 refills | Status: DC

## 2022-02-26 NOTE — Progress Notes (Signed)
Patient ID: Angela Pacheco, female   DOB: 07-20-1966, 56 y.o.   MRN: [id number]   Subjective:    Patient ID: Angela Pacheco, female    DOB: Jul 11, 1966, 56 y.o.   MRN: [id number]  This visit occurred during the SARS-CoV-2 public health emergency.  Safety protocols were in place, including screening questions prior to the visit, additional usage of staff PPE, and extensive cleaning of exam room while observing appropriate contact time as indicated for disinfecting solutions.   Patient here for work in appt.   Chief Complaint  Patient presents with   Follow-up    F/u - central abdominal pain x 1 week. Pt states has been taking antacids with little/no relief.   Marland Kitchen   HPI Noticed over the last week - abdominal pain.  Taking prilosec and pepcid with no relief. Epigastric pain.  Intermittent mild episodes prior to last week.  Occurred after eating spicy food- end of last week.  Constant for several days after.  Feeling some better now.  Eating bland foods. Worse when eats heavy foods.  Worse when eats beef. Has decreased coffee intake.  Drinking tea.  Decreased appetite.  Hiccups at night.  Has started 12/12 fast.     Past Medical History:  Diagnosis Date   Allergy    cats trigger   Asthma    mild   B12 deficiency 82/42/3536   Complication of anesthesia    Dysplastic nevus 11/01/2017   Right middle shoulder, mod, excision by Dr Phillip Heal   Dysplastic nevus 07/03/2020   L medial scapular back, lat to T5, moderate, excision by Dr Phillip Heal   Dysplastic nevus 06/09/2020   L scapular back lateral, mild   Hypothyroidism    Thyroid disease    Past Surgical History:  Procedure Laterality Date   ABDOMINAL HYSTERECTOMY     cervix left in place   APPENDECTOMY  1991   CHOLECYSTECTOMY     COLONOSCOPY WITH PROPOFOL N/A 10/08/2019   Procedure: COLONOSCOPY WITH PROPOFOL;  Surgeon: Lin Landsman, MD;  Location: [facility] ENDOSCOPY;  Service: Gastroenterology;  Laterality: N/A;   Family  History  Problem Relation Age of Onset   Arthritis Mother    Breast cancer Neg Hx    Social History   Socioeconomic History   Marital status: Married    Spouse name: Not on file   Number of children: Not on file   Years of education: Not on file   Highest education level: Not on file  Occupational History   Not on file  Tobacco Use   Smoking status: Never   Smokeless tobacco: Never  Substance and Sexual Activity   Alcohol use: Yes    Alcohol/week: 0.0 standard drinks    Comment: occ.   Drug use: No   Sexual activity: Not on file  Other Topics Concern   Not on file  Social History Narrative   Not on file   Social Determinants of Health   Financial Resource Strain: Not on file  Food Insecurity: Not on file  Transportation Needs: Not on file  Physical Activity: Not on file  Stress: Not on file  Social Connections: Not on file     Review of Systems  Constitutional:  Negative for appetite change and unexpected weight change.  HENT:  Negative for congestion and sinus pressure.   Respiratory:  Negative for cough, chest tightness and shortness of breath.   Cardiovascular:  Negative for chest pain, palpitations and leg swelling.  Gastrointestinal:  Negative for  abdominal pain, diarrhea, nausea and vomiting.  Genitourinary:  Negative for difficulty urinating and dysuria.  Musculoskeletal:  Negative for joint swelling and myalgias.  Skin:  Negative for color change and rash.  Neurological:  Negative for dizziness, light-headedness and headaches.  Psychiatric/Behavioral:  Negative for agitation and dysphoric mood.       Objective:     BP 120/72    Pulse 88    Temp 97.6 F (36.4 C)    Resp 16    Ht 5' (1.524 m)    Wt 137 lb 6.4 oz (62.3 kg)    SpO2 98%    BMI 26.83 kg/m  Wt Readings from Last 3 Encounters:  02/26/22 137 lb 6.4 oz (62.3 kg)  02/08/22 138 lb (62.6 kg)  01/19/22 138 lb (62.6 kg)    Physical Exam Vitals reviewed.  Constitutional:      General: She  is not in acute distress.    Appearance: Normal appearance.  HENT:     Head: Normocephalic and atraumatic.     Right Ear: External ear normal.     Left Ear: External ear normal.  Eyes:     General: No scleral icterus.       Right eye: No discharge.        Left eye: No discharge.     Conjunctiva/sclera: Conjunctivae normal.  Neck:     Thyroid: No thyromegaly.  Cardiovascular:     Rate and Rhythm: Normal rate and regular rhythm.  Pulmonary:     Effort: No respiratory distress.     Breath sounds: Normal breath sounds. No wheezing.  Abdominal:     General: Bowel sounds are normal.     Palpations: Abdomen is soft.     Tenderness: There is no abdominal tenderness.  Musculoskeletal:        General: No swelling or tenderness.     Cervical back: Neck supple. No tenderness.  Lymphadenopathy:     Cervical: No cervical adenopathy.  Skin:    Findings: No erythema or rash.  Neurological:     Mental Status: She is alert.  Psychiatric:        Mood and Affect: Mood normal.        Behavior: Behavior normal.     Outpatient Encounter Medications as of 02/26/2022  Medication Sig   albuterol (VENTOLIN HFA) 108 (90 Base) MCG/ACT inhaler INHALE 2 PUFFS INTO THE LUNGS EVERY 6 HOURS AS NEEDED FOR WHEEZING OR SHORTNESS OF BREATH   cyanocobalamin (,VITAMIN B-12,) 1000 MCG/ML injection INJECT 1 ML IN THE MUSCLE EVERY 30 DAYS   levothyroxine (SYNTHROID) 100 MCG tablet TAKE 1 TABLET(100 MCG) BY MOUTH DAILY   pantoprazole (PROTONIX) 40 MG tablet Take 1 tablet (40 mg total) by mouth 2 (two) times daily before a meal.   SYRINGE-NEEDLE, DISP, 3 ML (BD INTEGRA SYRINGE) 25G X 1" 3 ML MISC Use as directed with B12 injections   Triamcinolone Acetonide (NASACORT AQ NA) Place into the nose.   [DISCONTINUED] Calcium 600-200 MG-UNIT tablet Take 1 tablet by mouth 2 (two) times daily with a meal.   No facility-administered encounter medications on file as of 02/26/2022.     Lab Results  Component Value Date   WBC  7.3 10/06/2021   HGB 14.9 10/06/2021   HCT 44.4 10/06/2021   PLT 325.0 10/06/2021   GLUCOSE 87 10/06/2021   CHOL 182 10/06/2021   TRIG 83.0 10/06/2021   HDL 59.90 10/06/2021   LDLCALC 106 (H) 10/06/2021   ALT 25 10/06/2021  AST 21 10/06/2021   NA 141 10/06/2021   K 4.0 10/06/2021   CL 107 10/06/2021   CREATININE 0.86 10/06/2021   BUN 17 10/06/2021   CO2 25 10/06/2021   TSH 0.96 10/06/2021    MM 3D SCREEN BREAST BILATERAL  Result Date: 07/23/2021 CLINICAL DATA:  Screening. EXAM: DIGITAL SCREENING BILATERAL MAMMOGRAM WITH TOMOSYNTHESIS AND CAD TECHNIQUE: Bilateral screening digital craniocaudal and mediolateral oblique mammograms were obtained. Bilateral screening digital breast tomosynthesis was performed. The images were evaluated with computer-aided detection. COMPARISON:  Previous exam(s). ACR Breast Density Category b: There are scattered areas of fibroglandular density. FINDINGS: There are no findings suspicious for malignancy. IMPRESSION: No mammographic evidence of malignancy. A result letter of this screening mammogram will be mailed directly to the patient. RECOMMENDATION: Screening mammogram in one year. (Code:SM-B-01Y) BI-RADS CATEGORY  1: Negative. Electronically Signed   By: Lillia Mountain M.D.   On: 07/23/2021 14:56      Assessment & Plan:   Problem List Items Addressed This Visit     Asthma    Breathing stable.       Epigastric pain    Increased epigastric pain and nausea as outlined.  Change PPI.  Start protonix bid.  Check labs as outlined.  Check abdominal ultrasound.        Relevant Orders   CBC with Differential/Platelet   Hepatic function panel   Basic metabolic panel   Amylase   Lipase   US Abdomen Complete   GERD (gastroesophageal reflux disease)    Has been on prilosec and pepcid.  Given increased nausea, pain, will change PPI to protonix.  Start bid.  Check labs - liver panel and pancreas tests.  Also check abdominal ultrasound.  Further w/up  pending results.        Relevant Medications   pantoprazole (PROTONIX) 40 MG tablet   Stress    Persistent stress at school.  Now seeing a therapist.  Appears to be going well.  Follow.          [name], MD

## 2022-02-27 ENCOUNTER — Encounter: Payer: Self-pay | Admitting: Internal Medicine

## 2022-02-27 NOTE — Assessment & Plan Note (Signed)
Persistent stress at school.  Now seeing a therapist.  Appears to be going well.  Follow.   ?

## 2022-02-27 NOTE — Assessment & Plan Note (Signed)
Increased epigastric pain and nausea as outlined.  Change PPI.  Start protonix bid.  Check labs as outlined.  Check abdominal ultrasound.   ?

## 2022-02-27 NOTE — Assessment & Plan Note (Signed)
Has been on prilosec and pepcid.  Given increased nausea, pain, will change PPI to protonix.  Start bid.  Check labs - liver panel and pancreas tests.  Also check abdominal ultrasound.  Further w/up pending results.   ?

## 2022-02-27 NOTE — Assessment & Plan Note (Signed)
Breathing stable.

## 2022-03-02 ENCOUNTER — Telehealth: Payer: Self-pay

## 2022-03-02 NOTE — Telephone Encounter (Signed)
Pt aware. Scheduled for labs ?

## 2022-03-02 NOTE — Telephone Encounter (Signed)
LMTCB for labs. 

## 2022-03-02 NOTE — Telephone Encounter (Signed)
Patient returned office phone call for results 

## 2022-03-02 NOTE — Telephone Encounter (Signed)
-----   Message from [name], MD sent at 03/02/2022  3:31 AM EST ----- ?Notify Angela Pacheco that one liver test is slightly elevated.  The remainder of the liver panel is wnl. Her white blood cell count, hemoglobin, kidney function tests and pancreas tests are wnl.  She is already scheduled for an abdominal ultrasound.  Will follow up on these results.  I would also like to recheck liver panel in the next 10-14 days to confirm stable/normal.   ?

## 2022-03-04 ENCOUNTER — Other Ambulatory Visit: Payer: Self-pay

## 2022-03-04 ENCOUNTER — Ambulatory Visit
Admission: RE | Admit: 2022-03-04 | Discharge: 2022-03-04 | Disposition: A | Discharge status: Home / Self Care | Payer: BC Managed Care – PPO | Source: Ambulatory Visit | Attending: Internal Medicine | Admitting: Internal Medicine

## 2022-03-04 ENCOUNTER — Encounter: Payer: Self-pay | Admitting: Internal Medicine

## 2022-03-04 DIAGNOSIS — R1013 Epigastric pain: Secondary | ICD-10-CM | POA: Diagnosis not present

## 2022-03-09 ENCOUNTER — Telehealth: Payer: Self-pay

## 2022-03-09 NOTE — Telephone Encounter (Signed)
Patient called back, best time to reach her is between 1 and 1:30pm ?

## 2022-03-09 NOTE — Telephone Encounter (Signed)
CALLED PATIENT NO ANSWER LEFT VOICEMAIL FOR A CALL BACK ? ?

## 2022-03-09 NOTE — Telephone Encounter (Signed)
Scheduled now for 06/21/2022 ?

## 2022-03-11 ENCOUNTER — Encounter: Payer: Self-pay | Admitting: Internal Medicine

## 2022-03-11 ENCOUNTER — Other Ambulatory Visit: Payer: Self-pay

## 2022-03-15 ENCOUNTER — Ambulatory Visit: Payer: BC Managed Care – PPO | Admitting: Adult Health

## 2022-03-15 ENCOUNTER — Other Ambulatory Visit: Payer: Self-pay

## 2022-03-15 ENCOUNTER — Encounter: Payer: Self-pay | Admitting: Adult Health

## 2022-03-15 DIAGNOSIS — R400 Somnolence: Secondary | ICD-10-CM | POA: Diagnosis not present

## 2022-03-15 NOTE — Progress Notes (Signed)
@Patient  ID: Angela Pacheco, female    DOB: [birth year], 56 y.o.   MRN: [id number]  Chief Complaint  Patient presents with   Consult    Referring provider: Dale Leavenworth, MD  HPI: 56 year old female presents for sleep consult March 15, 2022 for snoring, daytime sleepiness, restless sleep  TEST/EVENTS :   03/15/2022 Sleep consult  Patient presents today for a sleep consult.  She complains of snoring, daytime sleepiness and restless sleep.  Patient complains of sleepiness with inactivity such as sitting, watching TV or riding in a car.  She can take a nap in the afternoons.  Has episodes where she struggles to breathe or gasp in her sleep.  Epworth score is 8.  Typically goes to bed about 10 PM.  Can take a little while to fall asleep.  Usually up about once in the night.  Gets up about 5:30 AM.  She does not operate heavy machinery at work.  Weight has been steady.  Has never had a sleep study done before. Feels tired with kind of brain fog daily . Dentist says she is grinding her teeth.  No symptoms suspicious for cataplexy or sleep paralysis. Caffeine intake 1 cup daily . No sleep aides.  No history of congestive heart failure or stroke Active, works fulltime. Exercises.   Medical history significant for hypothyroidism, GERD, allergic rhinitis, asthma  Social history; patient is married.  Is an Tourist information centre manager.  Drinks alcohol socially. No drug use. 2 adult kids.   Family history; sister has allergies, father had skin cancer.  Brother is positive for lupus anticoagulant ab  Surgical history; hysterectomy 2000.  History of cholecystectomy and appendectomy  Left shoulder /clavicle surgery 10/2021.   No Known Allergies  Immunization History  Administered Date(s) Administered   Influenza Split 09/26/2014   Influenza,inj,Quad PF,6+ Mos 11/13/2015   Influenza-Unspecified 10/10/2016, 10/27/2021   Moderna SARS-COV2 Booster Vaccination 02/20/2021   Moderna  Sars-Covid-2 Vaccination 02/26/2020, 03/26/2020   PPD Test 06/30/2018, 10/15/2020    Past Medical History:  Diagnosis Date   Allergy    cats trigger   Asthma    mild   B12 deficiency 05/06/2019   Complication of anesthesia    Dysplastic nevus 11/01/2017   Right middle shoulder, mod, excision by Dr Cheree Ditto   Dysplastic nevus 07/03/2020   L medial scapular back, lat to T5, moderate, excision by Dr Cheree Ditto   Dysplastic nevus 06/09/2020   L scapular back lateral, mild   Hypothyroidism    Thyroid disease     Tobacco History: Social History   Tobacco Use  Smoking Status Never  Smokeless Tobacco Never   Counseling given: Not Answered   Outpatient Medications Prior to Visit  Medication Sig Dispense Refill   albuterol (VENTOLIN HFA) 108 (90 Base) MCG/ACT inhaler INHALE 2 PUFFS INTO THE LUNGS EVERY 6 HOURS AS NEEDED FOR WHEEZING OR SHORTNESS OF BREATH 18 g 1   cyanocobalamin (,VITAMIN B-12,) 1000 MCG/ML injection INJECT 1 ML IN THE MUSCLE EVERY 30 DAYS 10 mL 0   levothyroxine (SYNTHROID) 100 MCG tablet TAKE 1 TABLET(100 MCG) BY MOUTH DAILY 30 tablet 5   pantoprazole (PROTONIX) 40 MG tablet Take 1 tablet (40 mg total) by mouth 2 (two) times daily before a meal. 60 tablet 1   SYRINGE-NEEDLE, DISP, 3 ML (BD INTEGRA SYRINGE) 25G X 1" 3 ML MISC Use as directed with B12 injections 100 each 0   Triamcinolone Acetonide (NASACORT AQ NA) Place into the nose.  No facility-administered medications prior to visit.     Review of Systems:   Constitutional:   No  weight loss, night sweats,  Fevers, chills, + fatigue, or  lassitude.  HEENT:   No headaches,  Difficulty swallowing,  Tooth/dental problems, or  Sore throat,                No sneezing, itching, ear ache,  +nasal congestion, post nasal drip,   CV:  No chest pain,  Orthopnea, PND, swelling in lower extremities, anasarca, dizziness, palpitations, syncope.   GI  No heartburn, indigestion, abdominal pain, nausea, vomiting,  diarrhea, change in bowel habits, loss of appetite, bloody stools.   Resp: No shortness of breath with exertion or at rest.  No excess mucus, no productive cough,  No non-productive cough,  No coughing up of blood.  No change in color of mucus.  No wheezing.  No chest wall deformity  Skin: no rash or lesions.  GU: no dysuria, change in color of urine, no urgency or frequency.  No flank pain, no hematuria   MS:  No joint pain or swelling.  No decreased range of motion.  No back pain.    Physical Exam  BP 118/60 (BP Location: Left Arm, Patient Position: Sitting, Cuff Size: Normal)   Pulse 74   Temp 97.8 F (36.6 C) (Oral)   Ht 5\' 1"  (1.549 m)   Wt 137 lb 6.4 oz (62.3 kg)   SpO2 99%   BMI 25.96 kg/m   GEN: A/Ox3; pleasant , NAD, well nourished    HEENT:  [place]/AT,   NOSE-clear, THROAT-clear, no lesions, no postnasal drip or exudate noted. Class 2-3 MP airway, Tonsills 1-2+   NECK:  Supple w/ fair ROM; no JVD; normal carotid impulses w/o bruits; no thyromegaly or nodules palpated; no lymphadenopathy.    RESP  Clear  P & A; w/o, wheezes/ rales/ or rhonchi. no accessory muscle use, no dullness to percussion  CARD:  RRR, no m/r/g, no peripheral edema, pulses intact, no cyanosis or clubbing.  GI:   Soft & nt; nml bowel sounds; no organomegaly or masses detected.   Musco: Warm bil, no deformities or joint swelling noted.   Neuro: alert, no focal deficits noted.    Skin: Warm, no lesions or rashes    Lab Results:  CBC    Component Value Date/Time   WBC 7.3 02/26/2022 0740   RBC 4.64 02/26/2022 0740   HGB 14.5 02/26/2022 0740   HGB 14.7 01/02/2020 0840   HCT 42.6 02/26/2022 0740   HCT 43.1 01/02/2020 0840   PLT 328.0 02/26/2022 0740   PLT 309 01/02/2020 0840   MCV 91.7 02/26/2022 0740   MCV 93 01/02/2020 0840   MCH 31.6 01/02/2020 0840   MCHC 34.0 02/26/2022 0740   RDW 12.9 02/26/2022 0740   RDW 12.3 01/02/2020 0840   LYMPHSABS 2.3 02/26/2022 0740   LYMPHSABS 2.2  01/02/2020 0840   MONOABS 0.5 02/26/2022 0740   EOSABS 0.2 02/26/2022 0740   EOSABS 0.3 01/02/2020 0840   BASOSABS 0.0 02/26/2022 0740   BASOSABS 0.0 01/02/2020 0840    BMET    Component Value Date/Time   NA 143 02/26/2022 0740   K 4.2 02/26/2022 0740   CL 106 02/26/2022 0740   CO2 24 02/26/2022 0740   GLUCOSE 89 02/26/2022 0740   BUN 17 02/26/2022 0740   CREATININE 0.86 02/26/2022 0740   CALCIUM 9.6 02/26/2022 0740    BNP No results found for: BNP  ProBNP No results found for: PROBNP  Imaging: US Abdomen Complete  Result Date: 03/04/2022 CLINICAL DATA:  epigastric pain EXAM: ABDOMEN ULTRASOUND COMPLETE COMPARISON:  None. FINDINGS: Gallbladder: Surgically absent. Common bile duct: Diameter: 5 mm, normal Liver: No focal lesion identified. Diffusely increased hepatic parenchymal echogenicity. Portal vein is patent on color Doppler imaging with normal direction of blood flow towards the liver. IVC: No abnormality visualized. Pancreas: Visualized portion unremarkable. Spleen: Visualized portion is unremarkable. Portions are not well visualized secondary to shadowing bowel gas. Right Kidney: Length: 9.9 cm. Echogenicity within normal limits. No mass or hydronephrosis visualized. Left Kidney: Length: 9.0 cm. Echogenicity within normal limits. No mass or hydronephrosis visualized. Abdominal aorta: No aneurysm visualized. Other findings: None. IMPRESSION: Hepatic steatosis. Electronically Signed   By: Meda Klinefelter M.D.   On: 03/04/2022 12:52      No flowsheet data found.  No results found for: NITRICOXIDE      Assessment & Plan:   Daytime sleepiness Daytime sleepiness, restless sleep, snoring, gasping during sleep all suspicious for underlying sleep apnea.  Patient will be set up for home sleep study.  Patient education was given on sleep apnea  - discussed how weight can impact sleep and risk for sleep disordered breathing - discussed options to assist with weight loss:  combination of diet modification, cardiovascular and strength training exercises   - had an extensive discussion regarding the adverse health consequences related to untreated sleep disordered breathing - specifically discussed the risks for hypertension, coronary artery disease, cardiac dysrhythmias, cerebrovascular disease, and diabetes - lifestyle modification discussed   - discussed how sleep disruption can increase risk of accidents, particularly when driving - safe driving practices were discussed    Plan  Patient Instructions  Set up for home sleep study Healthy sleep regimen Follow-up in 6 to 8 weeks to discuss results and treatment plan       Rubye Oaks, NP 03/15/2022

## 2022-03-15 NOTE — Progress Notes (Signed)
Reviewed and agree with assessment/plan. ? ? ?Chesley Mires, MD ?[place] ?03/15/2022, 5:02 PM ?Pager:  [phone] ? ?

## 2022-03-15 NOTE — Assessment & Plan Note (Signed)
Daytime sleepiness, restless sleep, snoring, gasping during sleep all suspicious for underlying sleep apnea.  Patient will be set up for home sleep study.  Patient education was given on sleep apnea ? ?- discussed how weight can impact sleep and risk for sleep disordered breathing ?- discussed options to assist with weight loss: combination of diet modification, cardiovascular and strength training exercises ?  ?- had an extensive discussion regarding the adverse health consequences related to untreated sleep disordered breathing ?- specifically discussed the risks for hypertension, coronary artery disease, cardiac dysrhythmias, cerebrovascular disease, and diabetes ?- lifestyle modification discussed ?  ?- discussed how sleep disruption can increase risk of accidents, particularly when driving ?- safe driving practices were discussed ?  ? ?Plan  ?Patient Instructions  ?Set up for home sleep study ?Healthy sleep regimen ?Follow-up in 6 to 8 weeks to discuss results and treatment plan ?  ? ?

## 2022-03-15 NOTE — Patient Instructions (Signed)
Set up for home sleep study ?Healthy sleep regimen ?Follow-up in 6 to 8 weeks to discuss results and treatment plan ?

## 2022-03-16 ENCOUNTER — Ambulatory Visit: Payer: BC Managed Care – PPO | Admitting: Dermatology

## 2022-03-17 ENCOUNTER — Other Ambulatory Visit: Payer: Self-pay

## 2022-03-17 ENCOUNTER — Other Ambulatory Visit (INDEPENDENT_AMBULATORY_CARE_PROVIDER_SITE_OTHER): Payer: BC Managed Care – PPO

## 2022-03-17 DIAGNOSIS — Z13220 Encounter for screening for lipoid disorders: Secondary | ICD-10-CM | POA: Diagnosis not present

## 2022-03-17 DIAGNOSIS — Z1159 Encounter for screening for other viral diseases: Secondary | ICD-10-CM

## 2022-03-17 DIAGNOSIS — Z114 Encounter for screening for human immunodeficiency virus [HIV]: Secondary | ICD-10-CM

## 2022-03-18 ENCOUNTER — Encounter: Payer: Self-pay | Admitting: Internal Medicine

## 2022-03-18 LAB — COMPREHENSIVE METABOLIC PANEL
ALT: 48 U/L — ABNORMAL HIGH (ref 0–35)
AST: 28 U/L (ref 0–37)
Albumin: 4.4 g/dL (ref 3.5–5.2)
Alkaline Phosphatase: 75 U/L (ref 39–117)
BUN: 17 mg/dL (ref 6–23)
CO2: 25 mEq/L (ref 19–32)
Calcium: 9.2 mg/dL (ref 8.4–10.5)
Chloride: 108 mEq/L (ref 96–112)
Creatinine, Ser: 1.01 mg/dL (ref 0.40–1.20)
GFR: 63.33 mL/min (ref >60.00)
Glucose, Bld: 91 mg/dL (ref 70–99)
Potassium: 4 mEq/L (ref 3.5–5.1)
Sodium: 141 mEq/L (ref 135–145)
Total Bilirubin: 0.4 mg/dL (ref 0.2–1.2)
Total Protein: 6.6 g/dL (ref 6.0–8.3)

## 2022-03-18 LAB — LIPID PANEL
Cholesterol: 174 mg/dL (ref 0–200)
HDL: 51 mg/dL (ref >39.00)
LDL Cholesterol: 105 mg/dL — ABNORMAL HIGH (ref 0–99)
NonHDL: 122.75
Total CHOL/HDL Ratio: 3
Triglycerides: 88 mg/dL (ref 0.0–149.0)
VLDL: 17.6 mg/dL (ref 0.0–40.0)

## 2022-03-18 LAB — HEPATITIS C ANTIBODY
Hepatitis C Ab: NONREACTIVE
SIGNAL TO CUT-OFF: 0.09 (ref <1.00)

## 2022-03-18 LAB — HIV ANTIBODY (ROUTINE TESTING W REFLEX): HIV 1&2 Ab, 4th Generation: NONREACTIVE

## 2022-03-19 ENCOUNTER — Other Ambulatory Visit: Payer: Self-pay

## 2022-03-19 ENCOUNTER — Encounter: Payer: Self-pay | Admitting: Internal Medicine

## 2022-03-19 ENCOUNTER — Ambulatory Visit: Payer: BC Managed Care – PPO | Admitting: Internal Medicine

## 2022-03-19 DIAGNOSIS — E039 Hypothyroidism, unspecified: Secondary | ICD-10-CM | POA: Diagnosis not present

## 2022-03-19 DIAGNOSIS — J4520 Mild intermittent asthma, uncomplicated: Secondary | ICD-10-CM

## 2022-03-19 DIAGNOSIS — K219 Gastro-esophageal reflux disease without esophagitis: Secondary | ICD-10-CM

## 2022-03-19 DIAGNOSIS — F439 Reaction to severe stress, unspecified: Secondary | ICD-10-CM | POA: Diagnosis not present

## 2022-03-19 DIAGNOSIS — R1013 Epigastric pain: Secondary | ICD-10-CM

## 2022-03-19 DIAGNOSIS — R400 Somnolence: Secondary | ICD-10-CM

## 2022-03-19 DIAGNOSIS — H9190 Unspecified hearing loss, unspecified ear: Secondary | ICD-10-CM

## 2022-03-19 MED ORDER — CYANOCOBALAMIN 1000 MCG/ML IJ SOLN: INTRAMUSCULAR | 0 refills | Status: DC

## 2022-03-19 MED ORDER — LEVOTHYROXINE SODIUM 100 MCG PO TABS: ORAL_TABLET | ORAL | 5 refills | Status: DC

## 2022-03-19 NOTE — Progress Notes (Signed)
Patient ID: Angela Pacheco, female   DOB: 12/01/1966, 56 y.o.   MRN: [id number] ? ? ?Subjective:  ? ? Patient ID: Angela Pacheco, female    DOB: [birth year], 56 y.o.   MRN: [id number] ? ?This visit occurred during the SARS-CoV-2 public health emergency.  Safety protocols were in place, including screening questions prior to the visit, additional usage of staff PPE, and extensive cleaning of exam room while observing appropriate contact time as indicated for disinfecting solutions.  ? ?Patient here for a scheduled follow up.  ? ?Chief Complaint  ?Patient presents with  ? Follow-up  ?  Epigastric pain patient reports is better & just is intermittent. Pt stated that she has drastically changed diet that has really helped.   ? .  ? ?HPI ?Recently has been having issues with epigastric pain and GI symptoms.  Was placed on bid protonix last visit.  Abdominal ultrasound - fatty liver. No acute abnormality.  She has adjusted her diet.  Started NOOM.  Monitor po intake.  Found that beef and pork irritate her stomach.  Fish and chicken - better.  Overall feels she is doing better.  Still with some epigastric symptoms, but has improved.  Still with increased stress.  Discussed.  Seeing a therapist.  Does not feel needs any further intervention at this time.  Saw pulmonary.  Planning for HST.  Also has an appt with ENT to evaluate hearing and sinuses and plans to discuss sleep with them as well.   ? ? ?Past Medical History:  ?Diagnosis Date  ? Allergy   ? cats trigger  ? Asthma   ? mild  ? B12 deficiency 05/06/2019  ? Complication of anesthesia   ? Dysplastic nevus 11/01/2017  ? Right middle shoulder, mod, excision by Dr Phillip Heal  ? Dysplastic nevus 07/03/2020  ? L medial scapular back, lat to T5, moderate, excision by Dr Phillip Heal  ? Dysplastic nevus 06/09/2020  ? L scapular back lateral, mild  ? Hypothyroidism   ? Thyroid disease   ? ?Past Surgical History:  ?Procedure Laterality Date  ? ABDOMINAL HYSTERECTOMY    ? cervix  left in place  ? APPENDECTOMY  1991  ? CHOLECYSTECTOMY    ? COLONOSCOPY WITH PROPOFOL N/A 10/08/2019  ? Procedure: COLONOSCOPY WITH PROPOFOL;  Surgeon: Lin Landsman, MD;  Location: [facility] ENDOSCOPY;  Service: Gastroenterology;  Laterality: N/A;  ? ?Family History  ?Problem Relation Age of Onset  ? Arthritis Mother   ? Breast cancer Neg Hx   ? ?Social History  ? ?Socioeconomic History  ? Marital status: Married  ?  Spouse name: Not on file  ? Number of children: Not on file  ? Years of education: Not on file  ? Highest education level: Not on file  ?Occupational History  ? Not on file  ?Tobacco Use  ? Smoking status: Never  ? Smokeless tobacco: Never  ?Substance and Sexual Activity  ? Alcohol use: Yes  ?  Alcohol/week: 0.0 standard drinks  ?  Comment: occ.  ? Drug use: No  ? Sexual activity: Not on file  ?Other Topics Concern  ? Not on file  ?Social History Narrative  ? Not on file  ? ?Social Determinants of Health  ? ?Financial Resource Strain: Not on file  ?Food Insecurity: Not on file  ?Transportation Needs: Not on file  ?Physical Activity: Not on file  ?Stress: Not on file  ?Social Connections: Not on file  ? ? ? ?Review of Systems  ?  Constitutional:  Negative for appetite change and unexpected weight change.  ?HENT:  Negative for congestion and sinus pressure.   ?Respiratory:  Negative for cough, chest tightness and shortness of breath.   ?Cardiovascular:  Negative for palpitations and leg swelling.  ?Gastrointestinal:  Negative for diarrhea, nausea and vomiting.  ?     Abdominal discomfort as outlined.   ?Genitourinary:  Negative for difficulty urinating and dysuria.  ?Musculoskeletal:  Negative for joint swelling and myalgias.  ?Skin:  Negative for color change and rash.  ?Neurological:  Negative for dizziness, light-headedness and headaches.  ?Psychiatric/Behavioral:  Negative for agitation and dysphoric mood.   ? ?   ?Objective:  ?  ? ?BP 128/82 (BP Location: Left Arm, Patient Position: Sitting, Cuff  Size: Small)   Pulse 69   Temp 97.9 ?F (36.6 ?C) (Oral)   Ht '5\' 1"'$  (1.549 m)   Wt 137 lb 6.4 oz (62.3 kg)   SpO2 99%   BMI 25.96 kg/m?  ?Wt Readings from Last 3 Encounters:  ?03/18/22 137 lb 6.4 oz (62.3 kg)  ?03/15/22 137 lb 6.4 oz (62.3 kg)  ?02/26/22 137 lb 6.4 oz (62.3 kg)  ? ? ?Physical Exam ?Vitals reviewed.  ?Constitutional:   ?   General: She is not in acute distress. ?   Appearance: Normal appearance.  ?HENT:  ?   Head: Normocephalic and atraumatic.  ?   Right Ear: External ear normal.  ?   Left Ear: External ear normal.  ?Eyes:  ?   General: No scleral icterus.    ?   Right eye: No discharge.     ?   Left eye: No discharge.  ?   Conjunctiva/sclera: Conjunctivae normal.  ?Neck:  ?   Thyroid: No thyromegaly.  ?Cardiovascular:  ?   Rate and Rhythm: Normal rate and regular rhythm.  ?Pulmonary:  ?   Effort: No respiratory distress.  ?   Breath sounds: Normal breath sounds. No wheezing.  ?Abdominal:  ?   General: Bowel sounds are normal.  ?   Palpations: Abdomen is soft.  ?   Comments: Minimal tenderness to palpation epigastric region.   ?Musculoskeletal:     ?   General: No swelling or tenderness.  ?   Cervical back: Neck supple. No tenderness.  ?Lymphadenopathy:  ?   Cervical: No cervical adenopathy.  ?Skin: ?   Findings: No erythema or rash.  ?Neurological:  ?   Mental Status: She is alert.  ?Psychiatric:     ?   Mood and Affect: Mood normal.     ?   Behavior: Behavior normal.  ? ? ? ?Outpatient Encounter Medications as of 03/19/2022  ?Medication Sig  ? albuterol (VENTOLIN HFA) 108 (90 Base) MCG/ACT inhaler INHALE 2 PUFFS INTO THE LUNGS EVERY 6 HOURS AS NEEDED FOR WHEEZING OR SHORTNESS OF BREATH  ? pantoprazole (PROTONIX) 40 MG tablet Take 1 tablet (40 mg total) by mouth 2 (two) times daily before a meal.  ? SYRINGE-NEEDLE, DISP, 3 ML (BD INTEGRA SYRINGE) 25G X 1" 3 ML MISC Use as directed with B12 injections  ? Triamcinolone Acetonide (NASACORT AQ NA) Place into the nose.  ? [DISCONTINUED]  cyanocobalamin (,VITAMIN B-12,) 1000 MCG/ML injection INJECT 1 ML IN THE MUSCLE EVERY 30 DAYS  ? [DISCONTINUED] levothyroxine (SYNTHROID) 100 MCG tablet TAKE 1 TABLET(100 MCG) BY MOUTH DAILY  ? cyanocobalamin (,VITAMIN B-12,) 1000 MCG/ML injection INJECT 1 ML IN THE MUSCLE EVERY 30 DAYS  ? levothyroxine (SYNTHROID) 100 MCG tablet TAKE 1  TABLET(100 MCG) BY MOUTH DAILY  ? ?No facility-administered encounter medications on file as of 03/19/2022.  ?  ? ?Lab Results  ?Component Value Date  ? WBC 7.3 02/26/2022  ? HGB 14.5 02/26/2022  ? HCT 42.6 02/26/2022  ? PLT 328.0 02/26/2022  ? GLUCOSE 91 03/17/2022  ? CHOL 174 03/17/2022  ? TRIG 88.0 03/17/2022  ? HDL 51.00 03/17/2022  ? LDLCALC 105 (H) 03/17/2022  ? ALT 48 (H) 03/17/2022  ? AST 28 03/17/2022  ? NA 141 03/17/2022  ? K 4.0 03/17/2022  ? CL 108 03/17/2022  ? CREATININE 1.01 03/17/2022  ? BUN 17 03/17/2022  ? CO2 25 03/17/2022  ? TSH 0.96 10/06/2021  ? ? ?US Abdomen Complete ? ?Result Date: 03/04/2022 ?CLINICAL DATA:  epigastric pain EXAM: ABDOMEN ULTRASOUND COMPLETE COMPARISON:  None. FINDINGS: Gallbladder: Surgically absent. Common bile duct: Diameter: 5 mm, normal Liver: No focal lesion identified. Diffusely increased hepatic parenchymal echogenicity. Portal vein is patent on color Doppler imaging with normal direction of blood flow towards the liver. IVC: No abnormality visualized. Pancreas: Visualized portion unremarkable. Spleen: Visualized portion is unremarkable. Portions are not well visualized secondary to shadowing bowel gas. Right Kidney: Length: 9.9 cm. Echogenicity within normal limits. No mass or hydronephrosis visualized. Left Kidney: Length: 9.0 cm. Echogenicity within normal limits. No mass or hydronephrosis visualized. Abdominal aorta: No aneurysm visualized. Other findings: None. IMPRESSION: Hepatic steatosis. Electronically Signed   By: Valentino Saxon M.D.   On: 03/04/2022 12:52  ? ? ?   ?Assessment & Plan:  ? ?Problem List Items Addressed This  Visit   ? ? Asthma  ?  Breathing stable.  ?  ?  ? Daytime sleepiness  ?  Saw pulmonary.  Planning for HST.   ?  ?  ? Decreased hearing  ?  Scheduled to see ENT.  ?  ?  ? Epigastric pain  ?  Abdominal ultrasound as outlined.

## 2022-03-20 ENCOUNTER — Encounter: Payer: Self-pay | Admitting: Internal Medicine

## 2022-03-20 NOTE — Assessment & Plan Note (Signed)
Scheduled to see ENT.  ?

## 2022-03-20 NOTE — Assessment & Plan Note (Signed)
Abdominal ultrasound as outlined.  On protonix bid.  Has adjusted diet.  Symptoms have improved.  Keep appt with GI.   ?

## 2022-03-20 NOTE — Assessment & Plan Note (Signed)
Breathing stable.

## 2022-03-20 NOTE — Assessment & Plan Note (Signed)
On protonix bid.  Has adjusted her diet as outlined.  Symptoms have improved.  Keep appt with GI with question of need for EGD.  Abdominal ultrasound as outlined.   ?

## 2022-03-20 NOTE — Assessment & Plan Note (Signed)
Saw pulmonary.  Planning for HST.   ?

## 2022-03-20 NOTE — Assessment & Plan Note (Signed)
On thyroid replacement.  Follow tsh.  

## 2022-03-20 NOTE — Assessment & Plan Note (Signed)
Increased stress as outlined.  Seeing a therapist.  Does not feel needs any further intervention at this time.  Follow.  

## 2022-04-06 ENCOUNTER — Encounter: Payer: Self-pay | Admitting: Internal Medicine

## 2022-04-07 NOTE — Telephone Encounter (Signed)
See if she would be agreeable to come in Monday to discuss syncope, etc.  I can work her in at 7:00 Monday 04/12/22 ?

## 2022-04-08 ENCOUNTER — Other Ambulatory Visit: Payer: Self-pay | Admitting: Internal Medicine

## 2022-04-12 ENCOUNTER — Telehealth (INDEPENDENT_AMBULATORY_CARE_PROVIDER_SITE_OTHER): Payer: BC Managed Care – PPO | Admitting: Internal Medicine

## 2022-04-12 DIAGNOSIS — K219 Gastro-esophageal reflux disease without esophagitis: Secondary | ICD-10-CM

## 2022-04-12 DIAGNOSIS — E039 Hypothyroidism, unspecified: Secondary | ICD-10-CM | POA: Diagnosis not present

## 2022-04-12 DIAGNOSIS — R400 Somnolence: Secondary | ICD-10-CM

## 2022-04-12 DIAGNOSIS — R42 Dizziness and giddiness: Secondary | ICD-10-CM

## 2022-04-12 DIAGNOSIS — H9190 Unspecified hearing loss, unspecified ear: Secondary | ICD-10-CM

## 2022-04-12 DIAGNOSIS — J4520 Mild intermittent asthma, uncomplicated: Secondary | ICD-10-CM | POA: Diagnosis not present

## 2022-04-12 DIAGNOSIS — F439 Reaction to severe stress, unspecified: Secondary | ICD-10-CM

## 2022-04-12 NOTE — Progress Notes (Signed)
Patient ID: Angela Pacheco, female   DOB: [birth year], 56 y.o.   MRN: [id number] ? ? ?Virtual Visit via video Note ? ?This visit type was conducted due to national recommendations for restrictions regarding the COVID-19 pandemic (e.g. social distancing).  This format is felt to be most appropriate for this patient at this time.  All issues noted in this document were discussed and addressed.  No physical exam was performed (except for noted visual exam findings with Video Visits).  ? ?I connected with [name] by a video enabled telemedicine application and verified that I am speaking with the correct person using two identifiers. ?Location patient: work ?Location provider: work  ?Persons participating in the virtual visit: patient, provider ? ?The limitations, risks, security and privacy concerns of performing an evaluation and management service by video and the availability of in person appointments have been discussed.  It has also been discussed with the patient that there may be a patient responsible charge related to this service. The patient expressed understanding and agreed to proceed. ? ? ?Reason for visit: work in appt ? ?HPI: ?Saw ENT recently.  Evaluated for ear fullness, dizziness and hearing change.  Concern raised regarding possible sleep apnea.  Tonsils are large.  Being referred for evaluation.  Has appt 05/2022.  Also discussed ear fullness and pressure. Diagnosed of TMJ. She discussed with ENT regarding her dizziness.  Notices when going from cold to warm.  The episode where she passed out occurred when hot and temperature change from cold pool to hot temperature.  Noticed when visiting [place] - walking into cold store.  Noticed dizziness.  ENT recommended cardiology evaluation.  No chest pain.  Breathing stable.  Eating.  No vomiting.  Still with increased stress.  Discussed.   ? ? ?ROS: See pertinent positives and negatives per HPI. ? ?Past Medical History:  ?Diagnosis Date  ? Allergy    ? cats trigger  ? Asthma   ? mild  ? B12 deficiency 05/06/2019  ? Complication of anesthesia   ? Dysplastic nevus 11/01/2017  ? Right middle shoulder, mod, excision by Dr Phillip Heal  ? Dysplastic nevus 07/03/2020  ? L medial scapular back, lat to T5, moderate, excision by Dr Phillip Heal  ? Dysplastic nevus 06/09/2020  ? L scapular back lateral, mild  ? Hypothyroidism   ? Thyroid disease   ? ? ?Past Surgical History:  ?Procedure Laterality Date  ? ABDOMINAL HYSTERECTOMY    ? cervix left in place  ? APPENDECTOMY  1991  ? CHOLECYSTECTOMY    ? COLONOSCOPY WITH PROPOFOL N/A 10/08/2019  ? Procedure: COLONOSCOPY WITH PROPOFOL;  Surgeon: Lin Landsman, MD;  Location: [facility] ENDOSCOPY;  Service: Gastroenterology;  Laterality: N/A;  ? ? ?Family History  ?Problem Relation Age of Onset  ? Arthritis Mother   ? Breast cancer Neg Hx   ? ? ?SOCIAL HX: reviewed.  ? ? ?Current Outpatient Medications:  ?  albuterol (VENTOLIN HFA) 108 (90 Base) MCG/ACT inhaler, INHALE 2 PUFFS INTO THE LUNGS EVERY 6 HOURS AS NEEDED FOR WHEEZING OR SHORTNESS OF BREATH, Disp: 18 g, Rfl: 1 ?  cyanocobalamin (,VITAMIN B-12,) 1000 MCG/ML injection, INJECT 1 ML IN THE MUSCLE EVERY 30 DAYS, Disp: 10 mL, Rfl: 0 ?  levothyroxine (SYNTHROID) 100 MCG tablet, TAKE 1 TABLET(100 MCG) BY MOUTH DAILY, Disp: 30 tablet, Rfl: 5 ?  pantoprazole (PROTONIX) 40 MG tablet, Take 1 tablet (40 mg total) by mouth 2 (two) times daily before a meal., Disp: 60 tablet, Rfl:  1 ?  sucralfate (CARAFATE) 1 g tablet, Take 1 g by mouth 2 (two) times daily., Disp: , Rfl:  ?  SYRINGE-NEEDLE, DISP, 3 ML (BD INTEGRA SYRINGE) 25G X 1" 3 ML MISC, Use as directed with B12 injections, Disp: 100 each, Rfl: 0 ?  Triamcinolone Acetonide (NASACORT AQ NA), Place into the nose., Disp: , Rfl:  ? ?EXAM: ? ?GENERAL: alert, oriented, appears well and in no acute distress ? ?HEENT: atraumatic, conjunttiva clear, no obvious abnormalities on inspection of external nose and ears ? ?NECK: normal movements of the head  and neck ? ?LUNGS: on inspection no signs of respiratory distress, breathing rate appears normal, no obvious gross SOB, gasping or wheezing ? ?CV: no obvious cyanosis ? ?PSYCH/NEURO: pleasant and cooperative, no obvious depression or anxiety, speech and thought processing grossly intact ? ?ASSESSMENT AND PLAN: ? ?Discussed the following assessment and plan: ? ?Problem List Items Addressed This Visit   ? ? Asthma  ?  Breathing stable.  ? ?  ?  ? Daytime sleepiness  ?  Has seen pulmonary.  Recently evaluated by ENT.  Large tonsils.  Being referred by ENT to evaluate sleep apnea.   ? ?  ?  ? Decreased hearing  ?  Evaluated by ENT.   ? ?  ?  ? Dizziness  ?  Dizziness appears to occur with temperature change as outlined.  Had the one brief syncopal episode as outlined.  Saw ENT.  Recommended cardiology evaluation for further evaluation - dizziness/syncope.  ? ?  ?  ? Relevant Orders  ? Ambulatory referral to Cardiology  ? GERD (gastroesophageal reflux disease)  ?  Continue protonix. Seeing [facility].  ? ?  ?  ? Relevant Medications  ? sucralfate (CARAFATE) 1 g tablet  ? Hypothyroidism  ?  On thyroid replacement.  Follow tsh.  ? ?  ?  ? Stress  ?  Increased stress as outlined.  Seeing a therapist.  Does not feel needs any further intervention at this time.  Follow.  ? ?  ?  ? ? ?Return if symptoms worsen or fail to improve, for keep scheduled. ?  ?I discussed the assessment and treatment plan with the patient. The patient was provided an opportunity to ask questions and all were answered. The patient agreed with the plan and demonstrated an understanding of the instructions. ?  ?The patient was advised to call back or seek an in-person evaluation if the symptoms worsen or if the condition fails to improve as anticipated. ? ? ?[name], MD   ?

## 2022-04-14 ENCOUNTER — Ambulatory Visit: Payer: BC Managed Care – PPO | Admitting: Dermatology

## 2022-04-15 ENCOUNTER — Ambulatory Visit: Payer: BC Managed Care – PPO

## 2022-04-15 DIAGNOSIS — G4733 Obstructive sleep apnea (adult) (pediatric): Secondary | ICD-10-CM | POA: Diagnosis not present

## 2022-04-15 DIAGNOSIS — R400 Somnolence: Secondary | ICD-10-CM

## 2022-04-18 ENCOUNTER — Encounter: Payer: Self-pay | Admitting: Internal Medicine

## 2022-04-18 DIAGNOSIS — R42 Dizziness and giddiness: Secondary | ICD-10-CM | POA: Insufficient documentation

## 2022-04-18 NOTE — Assessment & Plan Note (Signed)
Has seen pulmonary.  Recently evaluated by ENT.  Large tonsils.  Being referred by ENT to evaluate sleep apnea.   ?

## 2022-04-18 NOTE — Assessment & Plan Note (Signed)
Continue protonix. Seeing [facility].  ?

## 2022-04-18 NOTE — Assessment & Plan Note (Signed)
Dizziness appears to occur with temperature change as outlined.  Had the one brief syncopal episode as outlined.  Saw ENT.  Recommended cardiology evaluation for further evaluation - dizziness/syncope.  ?

## 2022-04-18 NOTE — Assessment & Plan Note (Signed)
Increased stress as outlined.  Seeing a therapist.  Does not feel needs any further intervention at this time.  Follow.  

## 2022-04-18 NOTE — Assessment & Plan Note (Signed)
On thyroid replacement.  Follow tsh.  

## 2022-04-18 NOTE — Assessment & Plan Note (Signed)
Breathing stable.

## 2022-04-18 NOTE — Assessment & Plan Note (Signed)
Evaluated by ENT.   ?

## 2022-04-19 DIAGNOSIS — G4733 Obstructive sleep apnea (adult) (pediatric): Secondary | ICD-10-CM | POA: Diagnosis not present

## 2022-04-23 ENCOUNTER — Ambulatory Visit: Payer: BC Managed Care – PPO | Admitting: Cardiology

## 2022-04-23 ENCOUNTER — Encounter: Payer: Self-pay | Admitting: Cardiology

## 2022-04-23 ENCOUNTER — Ambulatory Visit (INDEPENDENT_AMBULATORY_CARE_PROVIDER_SITE_OTHER): Payer: BC Managed Care – PPO

## 2022-04-23 ENCOUNTER — Telehealth: Payer: Self-pay | Admitting: Adult Health

## 2022-04-23 VITALS — BP 110/62 | HR 79 | Ht 60.0 in | Wt 129.0 lb

## 2022-04-23 DIAGNOSIS — R42 Dizziness and giddiness: Secondary | ICD-10-CM

## 2022-04-23 DIAGNOSIS — R55 Syncope and collapse: Secondary | ICD-10-CM

## 2022-04-23 NOTE — Telephone Encounter (Signed)
HST 04/15/2022 showed mild sleep apnea with AHI at 13.8/hour and SPO2 low at 89% ?Please set off office visit to review results and set up treatment plan ?

## 2022-04-23 NOTE — Patient Instructions (Signed)
Medication Instructions:  ? ?1.Your physician recommends that you continue on your current medications as directed. Please refer to the Current Medication list given to you today. ? ?*If you need a refill on your cardiac medications before your next appointment, please call your pharmacy* ? ? ?Lab Work: ? ?None Ordered ? ?If you have labs (blood work) drawn today and your tests are completely normal, you will receive your results only by: ?MyChart Message (if you have MyChart) OR ?A paper copy in the mail ?If you have any lab test that is abnormal or we need to change your treatment, we will call you to review the results. ? ? ?Testing/Procedures: ? ?Your physician has recommended that you wear a Zio monitor.  ? ?This monitor is a medical device that records the heart?s electrical activity. Doctors most often use these monitors to diagnose arrhythmias. Arrhythmias are problems with the speed or rhythm of the heartbeat. The monitor is a small device applied to your chest. You can wear one while you do your normal daily activities. While wearing this monitor if you have any symptoms to push the button and record what you felt. Once you have worn this monitor for the period of time provider prescribed (Usually 14 days), you will return the monitor device in the postage paid box. Once it is returned they will download the data collected and provide [place] with a report which the provider will then review and we will call you with those results. Important tips: ? ?Avoid showering during the first 24 hours of wearing the monitor. ?Avoid excessive sweating to help maximize wear time. ?Do not submerge the device, no hot tubs, and no swimming pools. ?Keep any lotions or oils away from the patch. ?After 24 hours you may shower with the patch on. Take brief showers with your back facing the shower head.  ?Do not remove patch once it has been placed because that will interrupt data and decrease adhesive wear time. ?Push the button  when you have any symptoms and write down what you were feeling. ?Once you have completed wearing your monitor, remove and place into box which has postage paid and place in your outgoing mailbox.  ?If for some reason you have misplaced your box then call our office and we can provide another box and/or mail it off for you. ? ? ? ?Follow-Up: ?At [facility], you and your health needs are our priority.  As part of our continuing mission to provide you with exceptional heart care, we have created designated Provider Care Teams.  These Care Teams include your primary Cardiologist (physician) and Advanced Practice Providers (APPs -  Physician Assistants and Nurse Practitioners) who all work together to provide you with the care you need, when you need it. ? ?We recommend signing up for the patient portal called "MyChart".  Sign up information is provided on this After Visit Summary.  MyChart is used to connect with patients for Virtual Visits (Telemedicine).  Patients are able to view lab/test results, encounter notes, upcoming appointments, etc.  Non-urgent messages can be sent to your provider as well.   ?To learn more about what you can do with MyChart, go to [email or web address].   ? ?Your next appointment:   ?AS NEEDED ? ?Important Information About Sugar ? ? ? ? ? ? ?

## 2022-04-23 NOTE — Progress Notes (Signed)
?Cardiology Office Note:   ? ?Date:  04/23/2022  ? ?ID:  Angela Pacheco, DOB 1966/11/24, MRN [id number] ? ?PCP:  [name], MD ?  ?[facility] Providers ?Cardiologist:  None    ? ?Referring MD: [name], MD  ? ?Chief Complaint  ?Patient presents with  ? OTher  ? New Patient (Initial Visit)  ?  Referred by PCP for dizziness. Patient c.o positional dizziness -- and in weather from cold to hot. Meds reviewed verbally with patient.   ? ?Angela Pacheco is a 56 y.o. female who is being seen today for the evaluation of dizziness at the request of [name], MD. ? ? ?History of Present Illness:   ? ?Angela Pacheco is a 56 y.o. female with a hx of hypothyroidism, GERD, Asthma who presents due to dizziness and syncope. ? ?She describes episodes of dizziness ongoing over the past several months.  Dizziness usually associated with changing positions abruptly such as standing from sitting position, raising head from bending position.  She is also felt dizzy and passed out with abrupt changes in temperature such as getting out from her car which is air conditioned on a hot day.  She usually gets warning signals such as arm weakness, leg fatigue, blurred vision prior to passing out.  He denies chest pain, palpitations, shortness of breath. ? ?Past Medical History:  ?Diagnosis Date  ? Allergy   ? cats trigger  ? Asthma   ? mild  ? B12 deficiency 05/06/2019  ? Complication of anesthesia   ? Dysplastic nevus 11/01/2017  ? Right middle shoulder, mod, excision by Dr Phillip Heal  ? Dysplastic nevus 07/03/2020  ? L medial scapular back, lat to T5, moderate, excision by Dr Phillip Heal  ? Dysplastic nevus 06/09/2020  ? L scapular back lateral, mild  ? Hypothyroidism   ? Thyroid disease   ? ? ?Past Surgical History:  ?Procedure Laterality Date  ? ABDOMINAL HYSTERECTOMY    ? cervix left in place  ? APPENDECTOMY  1991  ? CHOLECYSTECTOMY    ? COLONOSCOPY WITH PROPOFOL N/A 10/08/2019  ? Procedure: COLONOSCOPY WITH  PROPOFOL;  Surgeon: Lin Landsman, MD;  Location: [facility] ENDOSCOPY;  Service: Gastroenterology;  Laterality: N/A;  ? ? ?Current Medications: ?Current Meds  ?Medication Sig  ? albuterol (VENTOLIN HFA) 108 (90 Base) MCG/ACT inhaler INHALE 2 PUFFS INTO THE LUNGS EVERY 6 HOURS AS NEEDED FOR WHEEZING OR SHORTNESS OF BREATH  ? cyanocobalamin (,VITAMIN B-12,) 1000 MCG/ML injection INJECT 1 ML IN THE MUSCLE EVERY 30 DAYS  ? levothyroxine (SYNTHROID) 100 MCG tablet TAKE 1 TABLET(100 MCG) BY MOUTH DAILY  ? pantoprazole (PROTONIX) 40 MG tablet Take 1 tablet (40 mg total) by mouth 2 (two) times daily before a meal.  ? sucralfate (CARAFATE) 1 g tablet Take 1 g by mouth 2 (two) times daily.  ? SYRINGE-NEEDLE, DISP, 3 ML (BD INTEGRA SYRINGE) 25G X 1" 3 ML MISC Use as directed with B12 injections  ? Triamcinolone Acetonide (NASACORT AQ NA) Place into the nose.  ?  ? ?Allergies:   Patient has no known allergies.  ? ?Social History  ? ?Socioeconomic History  ? Marital status: Married  ?  Spouse name: Not on file  ? Number of children: Not on file  ? Years of education: Not on file  ? Highest education level: Not on file  ?Occupational History  ? Not on file  ?Tobacco Use  ? Smoking status: Never  ? Smokeless tobacco: Never  ?Substance and  Sexual Activity  ? Alcohol use: Yes  ?  Alcohol/week: 0.0 standard drinks  ?  Comment: occ.  ? Drug use: No  ? Sexual activity: Not on file  ?Other Topics Concern  ? Not on file  ?Social History Narrative  ? Not on file  ? ?Social Determinants of Health  ? ?Financial Resource Strain: Not on file  ?Food Insecurity: Not on file  ?Transportation Needs: Not on file  ?Physical Activity: Not on file  ?Stress: Not on file  ?Social Connections: Not on file  ?  ? ?Family History: ?The patient's family history includes Arthritis in her mother. There is no history of Breast cancer. ? ?ROS:   ?Please see the history of present illness.    ? All other systems reviewed and are negative. ? ?EKGs/Labs/Other  Studies Reviewed:   ? ?The following studies were reviewed today: ? ? ?EKG:  EKG is  ordered today.  The ekg ordered today demonstrates normal sinus rhythm, normal ECG. ? ?Recent Labs: ?10/06/2021: TSH 0.96 ?02/26/2022: Hemoglobin 14.5; Platelets 328.0 ?03/17/2022: ALT 48; BUN 17; Creatinine, Ser 1.01; Potassium 4.0; Sodium 141  ?Recent Lipid Panel ?   ?Component Value Date/Time  ? CHOL 174 03/17/2022 1556  ? TRIG 88.0 03/17/2022 1556  ? HDL 51.00 03/17/2022 1556  ? CHOLHDL 3 03/17/2022 1556  ? VLDL 17.6 03/17/2022 1556  ? [place] 105 (H) 03/17/2022 1556  ? ? ? ?Risk Assessment/Calculations:   ? ? ?    ? ?Physical Exam:   ? ?VS:  BP 110/62 (BP Location: Left Arm, Patient Position: Sitting, Cuff Size: Normal)   Pulse 79   Ht 5' (1.524 m)   Wt 129 lb (58.5 kg)   SpO2 97%   BMI 25.19 kg/m?    ? ?Wt Readings from Last 3 Encounters:  ?04/23/22 129 lb (58.5 kg)  ?03/18/22 137 lb 6.4 oz (62.3 kg)  ?03/15/22 137 lb 6.4 oz (62.3 kg)  ?  ? ?GEN:  Well nourished, well developed in no acute distress ?HEENT: Normal ?NECK: No JVD; No carotid bruits ?LYMPHATICS: No lymphadenopathy ?CARDIAC: RRR, no murmurs, rubs, gallops ?RESPIRATORY:  Clear to auscultation without rales, wheezing or rhonchi  ?ABDOMEN: Soft, non-tender, non-distended ?MUSCULOSKELETAL:  No edema; No deformity  ?SKIN: Warm and dry ?NEUROLOGIC:  Alert and oriented x 3 ?PSYCHIATRIC:  Normal affect  ? ?ASSESSMENT:   ? ?1. Dizziness   ?2. Syncope and collapse   ? ?PLAN:   ? ?In order of problems listed above: ? ?Patient with dizziness and syncope.  Symptoms appear noncardiac, symptoms consistent with vasovagal syncope and positional vertigo.  Orthostatic vitals in the office today were normal.  Denies chest pain, shortness of breath, palpitations.  Evaluate syncopal work-up with cardiac monitor to evaluate any arrhythmias . ?   ? ?Follow-up as needed pending cardiac monitor results. ? ? ?Medication Adjustments/Labs and Tests Ordered: ?Current medicines are reviewed at  length with the patient today.  Concerns regarding medicines are outlined above.  ?Orders Placed This Encounter  ?Procedures  ? LONG TERM MONITOR (3-14 DAYS)  ? EKG 12-Lead  ? ?No orders of the defined types were placed in this encounter. ? ? ?Patient Instructions  ?Medication Instructions:  ? ?1.Your physician recommends that you continue on your current medications as directed. Please refer to the Current Medication list given to you today. ? ?*If you need a refill on your cardiac medications before your next appointment, please call your pharmacy* ? ? ?Lab Work: ? ?None Ordered ? ?If  you have labs (blood work) drawn today and your tests are completely normal, you will receive your results only by: ?MyChart Message (if you have MyChart) OR ?A paper copy in the mail ?If you have any lab test that is abnormal or we need to change your treatment, we will call you to review the results. ? ? ?Testing/Procedures: ? ?Your physician has recommended that you wear a Zio monitor.  ? ?This monitor is a medical device that records the heart?s electrical activity. Doctors most often use these monitors to diagnose arrhythmias. Arrhythmias are problems with the speed or rhythm of the heartbeat. The monitor is a small device applied to your chest. You can wear one while you do your normal daily activities. While wearing this monitor if you have any symptoms to push the button and record what you felt. Once you have worn this monitor for the period of time provider prescribed (Usually 14 days), you will return the monitor device in the postage paid box. Once it is returned they will download the data collected and provide [place] with a report which the provider will then review and we will call you with those results. Important tips: ? ?Avoid showering during the first 24 hours of wearing the monitor. ?Avoid excessive sweating to help maximize wear time. ?Do not submerge the device, no hot tubs, and no swimming pools. ?Keep any lotions  or oils away from the patch. ?After 24 hours you may shower with the patch on. Take brief showers with your back facing the shower head.  ?Do not remove patch once it has been placed because that will inte

## 2022-04-26 ENCOUNTER — Telehealth: Payer: Self-pay | Admitting: Adult Health

## 2022-04-26 DIAGNOSIS — R55 Syncope and collapse: Secondary | ICD-10-CM | POA: Diagnosis not present

## 2022-04-26 DIAGNOSIS — R42 Dizziness and giddiness: Secondary | ICD-10-CM

## 2022-04-26 NOTE — Telephone Encounter (Signed)
ATC x1.  Left VM to return call.

## 2022-04-26 NOTE — Telephone Encounter (Signed)
Patient returned call, provided results/recommendations per Rexene Edison NP.  Scheduled video visit for May 11 at 4 pm, advised to join video visit by 3:45 pm.  She verbalized understanding.  Nothing further needed. ?

## 2022-05-04 IMAGING — US US AXILLARY RIGHT
1 series · 5 of 5 positions shown · non-contrast
Comparison: Previous exam(s).

CLINICAL DATA: 54-year-old presenting with fullness and
intermittent tenderness involving RIGHT axilla which may be related
to a change in underarm deodorant approximate 3 months ago. She
states that she had similar fullness in the LEFT axilla after the
deodorant change, but the LEFT axilla is now normal. Annual
evaluation, LEFT breast.

EXAM:
DIGITAL DIAGNOSTIC BILATERAL MAMMOGRAM WITH CAD AND TOMO
ULTRASOUND RIGHT AXILLA

[Series 1: us axillary right · 0.07mm/px · 5 of 5 slices shown]
[im 1/5]
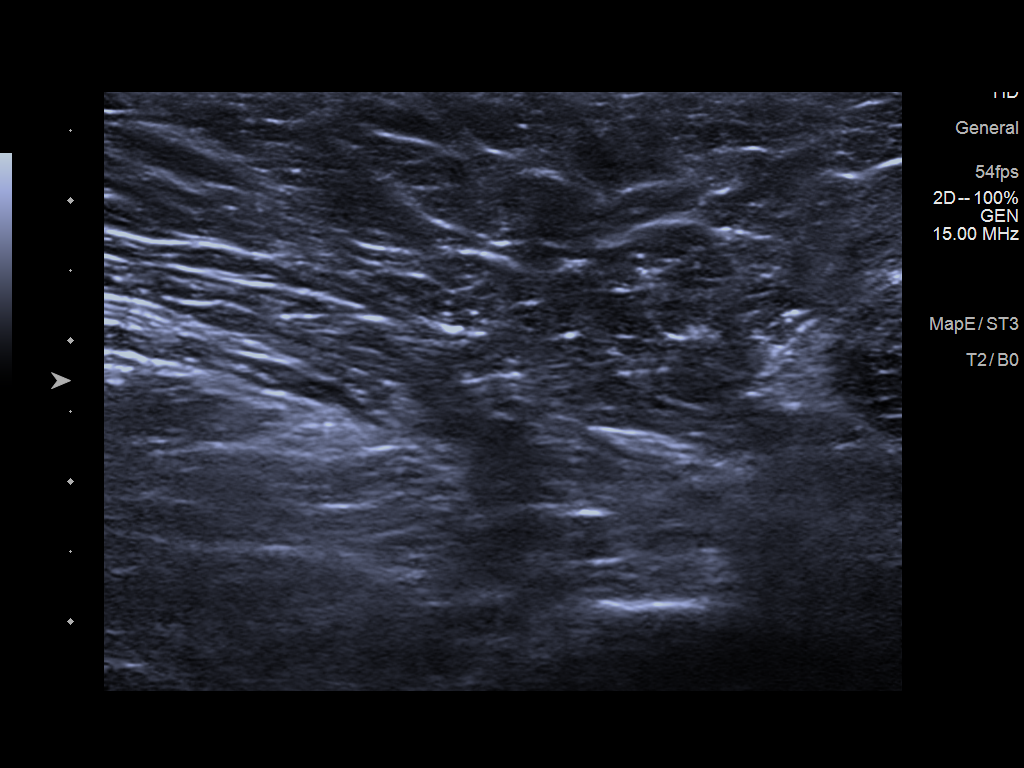
[im 2/5]
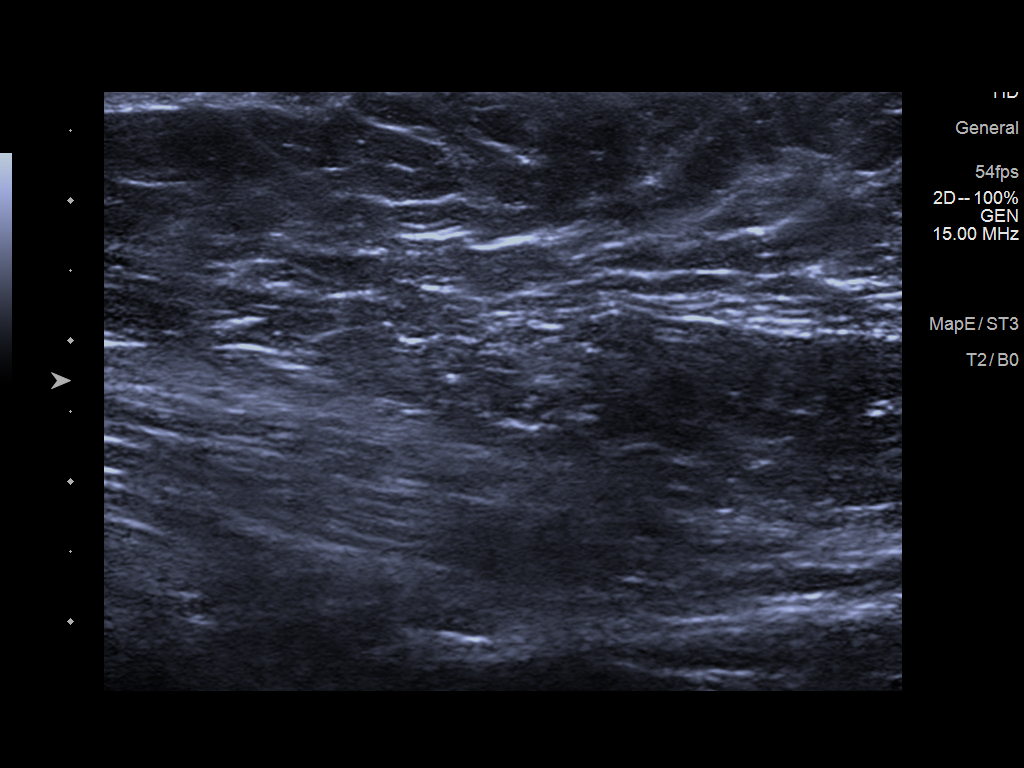
[im 3/5]
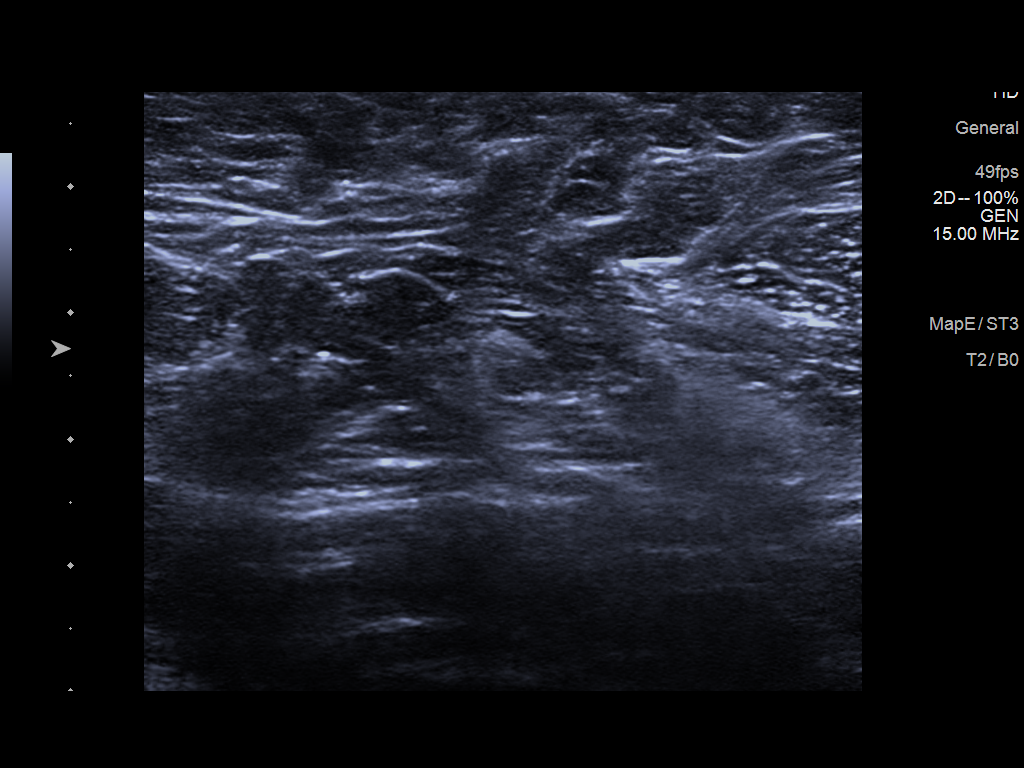
[im 4/5]
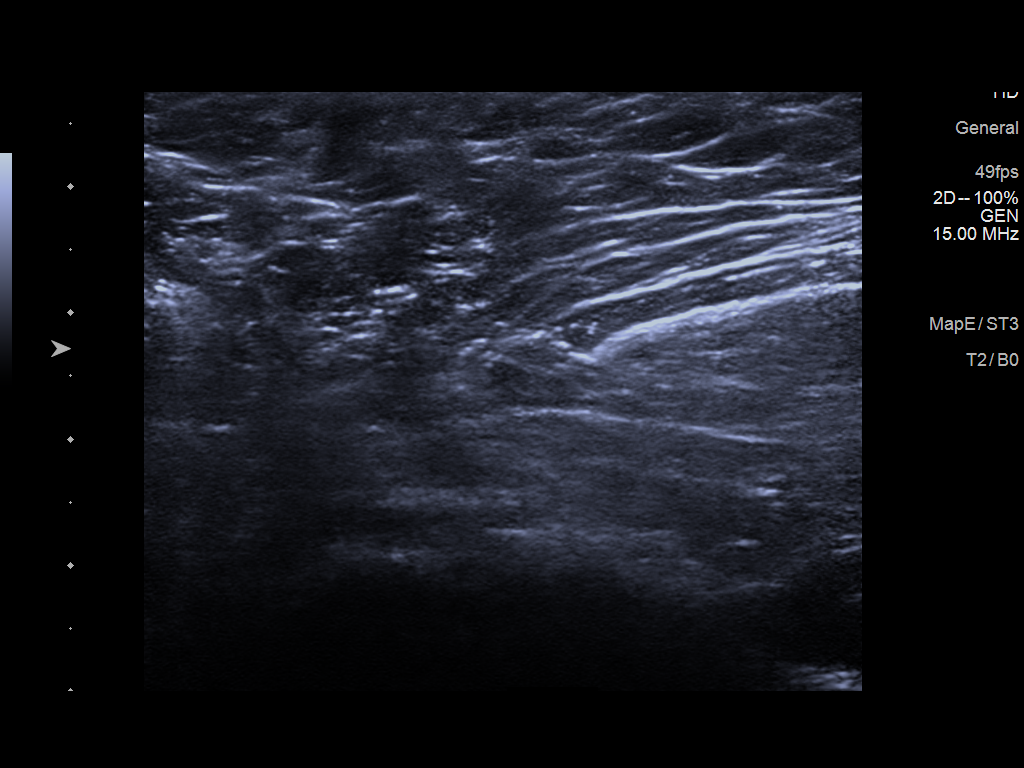
[im 5/5]
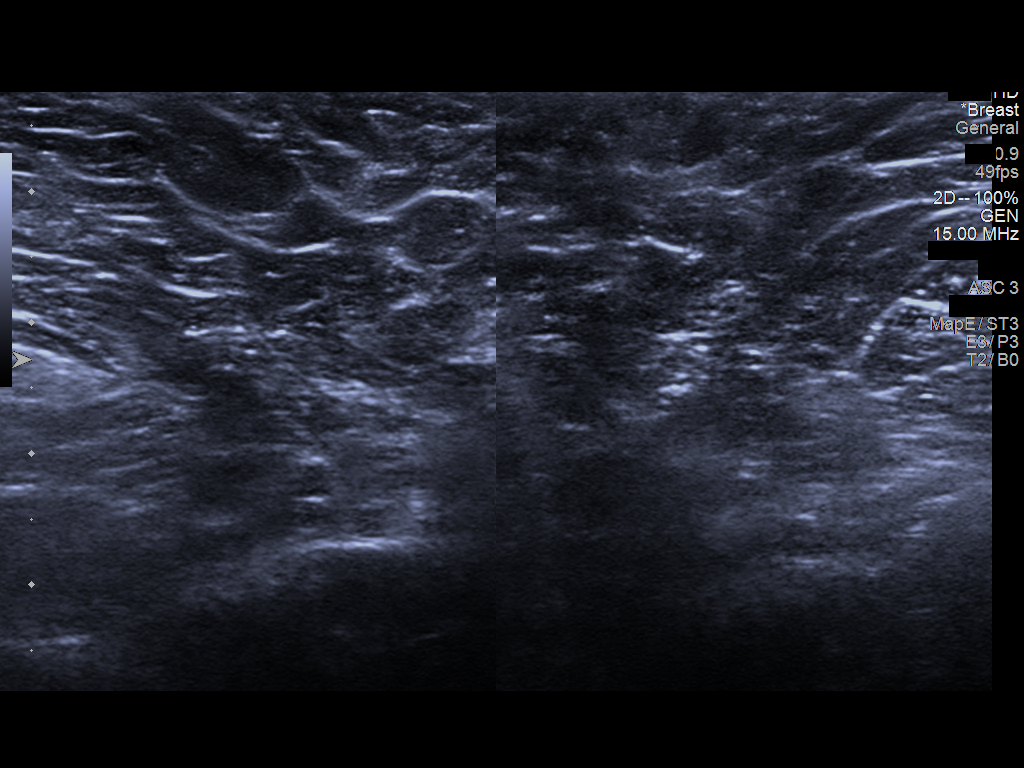

[5 of 5 positions shown; findings below may reference images not displayed]

ACR Breast Density Category b: There are scattered areas of
fibroglandular density.
FINDINGS: Tomosynthesis and synthesized full field CC and MLO views of both
breasts were obtained. Tomosynthesis and synthesized
spot-compression CC and MLO views of the a mammographic finding in
the RIGHT breast were also obtained.

Possible architectural distortion questioned in the OUTER RIGHT
breast at MIDDLE to POSTERIOR depth on the full field images does
not persist on the spot compression images and therefore represents
overlapping normal Cooper's ligaments. No findings suspicious for
malignancy in the RIGHT breast.

No findings suspicious for malignancy in the LEFT breast. Apparent
distortion in the UPPER breast at MIDDLE depth on the MLO view is
attributed to overlapping vascular structures and normal Cooper's
ligaments.

Mammographic images were processed with CAD.

On correlative physical exam, there is asymmetric puffiness of the
RIGHT axilla when compared to the LEFT, though there is no palpable
mass or lymphadenopathy.

Targeted RIGHT axillary ultrasound is performed, showing a normal
axillary fat pad in the area of swelling and puffiness. No mass or
pathologic lymphadenopathy is identified.
IMPRESSION: 1. No mammographic evidence of malignancy involving either breast.
2. No pathologic RIGHT axillary lymphadenopathy.

RECOMMENDATION:
Screening mammogram in one year.(Code:PJ-P-EDC)

I have discussed the findings and recommendations with the patient.
If applicable, a reminder letter will be sent to the patient
regarding the next appointment.

BI-RADS CATEGORY  1: Negative.

## 2022-05-05 ENCOUNTER — Other Ambulatory Visit: Payer: Self-pay

## 2022-05-05 ENCOUNTER — Encounter: Payer: Self-pay | Admitting: Internal Medicine

## 2022-05-05 MED ORDER — LEVOTHYROXINE SODIUM 100 MCG PO TABS: ORAL_TABLET | ORAL | 5 refills | Status: DC

## 2022-05-06 ENCOUNTER — Telehealth (INDEPENDENT_AMBULATORY_CARE_PROVIDER_SITE_OTHER): Payer: BC Managed Care – PPO | Admitting: Adult Health

## 2022-05-06 ENCOUNTER — Encounter: Payer: Self-pay | Admitting: Adult Health

## 2022-05-06 DIAGNOSIS — G4733 Obstructive sleep apnea (adult) (pediatric): Secondary | ICD-10-CM

## 2022-05-06 NOTE — Addendum Note (Signed)
Addended by: Vanessa Barbara on: 05/06/2022 04:44 PM ? ? Modules accepted: Orders ? ?

## 2022-05-06 NOTE — Progress Notes (Signed)
Virtual Visit via Video Note ? ?I connected with Angela Pacheco on 05/06/22 at  4:00 PM EDT by a video enabled telemedicine application and verified that I am speaking with the correct person using two identifiers. ? ?Location: ?Patient: Home  ?Provider:  Office  ?  ?I discussed the limitations of evaluation and management by telemedicine and the availability of in person appointments. The patient expressed understanding and agreed to proceed. ? ?History of Present Illness: ?56 year old female seen for sleep consult March 15, 2022 for snoring, daytime sleepiness and restless sleep.  Found to have mild obstructive sleep apnea ? ?Today's video visit is to review sleep study results.  Patient was seen March 15, 2022 for sleep consult.  She has some daytime sleepiness and snoring.  She was set up for home sleep study that was completed on April 15, 2022 that showed mild sleep apnea with AHI at 13.8/hour and SPO2 low at 89%.  We discussed her sleep study results.  Went over treatment options including weight loss, oral appliance and CPAP.  Patient would like to try out a oral appliance first.  Her husband has sleep apnea is on CPAP machine in fact he is getting evaluated for possible inspire device.  She wants to try the oral appliance first and avoid CPAP if possible..  ? ?Past Medical History:  ?Diagnosis Date  ? Allergy   ? cats trigger  ? Asthma   ? mild  ? B12 deficiency 05/06/2019  ? Complication of anesthesia   ? Dysplastic nevus 11/01/2017  ? Right middle shoulder, mod, excision by Dr Phillip Heal  ? Dysplastic nevus 07/03/2020  ? L medial scapular back, lat to T5, moderate, excision by Dr Phillip Heal  ? Dysplastic nevus 06/09/2020  ? L scapular back lateral, mild  ? Hypothyroidism   ? Thyroid disease   ? ?Current Outpatient Medications on File Prior to Visit  ?Medication Sig Dispense Refill  ? albuterol (VENTOLIN HFA) 108 (90 Base) MCG/ACT inhaler INHALE 2 PUFFS INTO THE LUNGS EVERY 6 HOURS AS NEEDED FOR WHEEZING OR  SHORTNESS OF BREATH 18 g 1  ? Calcium Carbonate-Vitamin D (CALTRATE 600+D PO) Take 1 tablet by mouth 2 (two) times daily.    ? cyanocobalamin (,VITAMIN B-12,) 1000 MCG/ML injection INJECT 1 ML IN THE MUSCLE EVERY 30 DAYS 10 mL 0  ? levothyroxine (SYNTHROID) 100 MCG tablet TAKE 1 TABLET(100 MCG) BY MOUTH DAILY 30 tablet 5  ? Magnesium 400 MG CAPS Take 1 tablet by mouth daily at 6 (six) AM.    ? pantoprazole (PROTONIX) 40 MG tablet Take 1 tablet (40 mg total) by mouth 2 (two) times daily before a meal. 60 tablet 1  ? sucralfate (CARAFATE) 1 g tablet Take 1 g by mouth 2 (two) times daily.    ? SYRINGE-NEEDLE, DISP, 3 ML (BD INTEGRA SYRINGE) 25G X 1" 3 ML MISC Use as directed with B12 injections 100 each 0  ? Triamcinolone Acetonide (NASACORT AQ NA) Place into the nose.    ? vitamin C (ASCORBIC ACID) 500 MG tablet Take 500 mg by mouth daily.    ? Zinc Sulfate (ZINC-220 PO) Take 1 tablet by mouth daily at 6 (six) AM.    ? ?No current facility-administered medications on file prior to visit.  ?  ? ?  ?Observations/Objective: ?HST 04/15/2022 showed mild sleep apnea with AHI at 13.8/hour and SPO2 low at 89% ? ?Assessment and Plan: ?Mild obstructive sleep apnea.  Patient education was given on sleep apnea.  Patient  will be referred to orthodontics for evaluation of a oral appliance to help with sleep apnea ? ?Refer to Dr. Toy Cookey ? ?Plan  ?Patient Instructions  ?Referred orthodontics for evaluation of oral appliance for sleep apnea ?Refer to Dr. Toy Cookey ?Healthy sleep regimen ?Remain active continue with healthy weight ?Do not drive if sleepy ?Follow-up in 6 months and as needed  ? ? ?Follow Up Instructions: ? ?  ?I discussed the assessment and treatment plan with the patient. The patient was provided an opportunity to ask questions and all were answered. The patient agreed with the plan and demonstrated an understanding of the instructions. ?  ?The patient was advised to call back or seek an in-person evaluation if the  symptoms worsen or if the condition fails to improve as anticipated. ? ?I provided 22 minutes of non-face-to-face time during this encounter. ? ? ?Rexene Edison, NP ? ?

## 2022-05-06 NOTE — Patient Instructions (Signed)
Referred orthodontics for evaluation of oral appliance for sleep apnea ?Refer to Dr. Toy Cookey ?Healthy sleep regimen ?Remain active continue with healthy weight ?Do not drive if sleepy ?Follow-up in 6 months and as needed ?

## 2022-05-06 NOTE — Progress Notes (Signed)
Reviewed and agree with assessment/plan. ? ? ?Chesley Mires, MD ?[place] ?05/06/2022, 4:44 PM ?Pager:  [phone] ? ?

## 2022-05-06 NOTE — Assessment & Plan Note (Signed)
Patient education given on sleep apnea.  Patient has mild obstructive sleep apnea.  She has significant symptoms with daytime sleepiness snoring and restless sleep.  She also has teeth grinding.  Will refer to Dr. Toy Cookey.  She is also been seen by ENT and is being evaluated for enlarged tonsils. ? ?Plan  ?Patient Instructions  ?Referred orthodontics for evaluation of oral appliance for sleep apnea ?Refer to Dr. Toy Cookey ?Healthy sleep regimen ?Remain active continue with healthy weight ?Do not drive if sleepy ?Follow-up in 6 months and as needed ?  ? ?

## 2022-05-20 ENCOUNTER — Encounter: Payer: BC Managed Care – PPO | Admitting: Internal Medicine

## 2022-06-02 ENCOUNTER — Telehealth: Payer: Self-pay | Admitting: Adult Health

## 2022-06-02 NOTE — Telephone Encounter (Signed)
Sleep study has been placed up front for pickup.   Lm for patient.

## 2022-06-14 ENCOUNTER — Other Ambulatory Visit: Payer: Self-pay | Admitting: Internal Medicine

## 2022-06-14 DIAGNOSIS — Z1231 Encounter for screening mammogram for malignant neoplasm of breast: Secondary | ICD-10-CM

## 2022-06-21 ENCOUNTER — Ambulatory Visit: Payer: BC Managed Care – PPO | Admitting: Gastroenterology

## 2022-06-22 ENCOUNTER — Encounter: Payer: Self-pay | Admitting: Internal Medicine

## 2022-06-22 ENCOUNTER — Ambulatory Visit: Payer: BC Managed Care – PPO | Admitting: Dermatology

## 2022-06-22 ENCOUNTER — Ambulatory Visit (INDEPENDENT_AMBULATORY_CARE_PROVIDER_SITE_OTHER): Payer: BC Managed Care – PPO | Admitting: Internal Medicine

## 2022-06-22 VITALS — BP 110/72 | HR 74 | Temp 98.2°F | Resp 19 | Ht 60.0 in | Wt 131.2 lb

## 2022-06-22 DIAGNOSIS — Z0000 Encounter for general adult medical examination without abnormal findings: Secondary | ICD-10-CM | POA: Diagnosis not present

## 2022-06-22 DIAGNOSIS — Z1283 Encounter for screening for malignant neoplasm of skin: Secondary | ICD-10-CM | POA: Diagnosis not present

## 2022-06-22 DIAGNOSIS — E039 Hypothyroidism, unspecified: Secondary | ICD-10-CM | POA: Diagnosis not present

## 2022-06-22 DIAGNOSIS — F439 Reaction to severe stress, unspecified: Secondary | ICD-10-CM

## 2022-06-22 DIAGNOSIS — D225 Melanocytic nevi of trunk: Secondary | ICD-10-CM

## 2022-06-22 DIAGNOSIS — Z808 Family history of malignant neoplasm of other organs or systems: Secondary | ICD-10-CM

## 2022-06-22 DIAGNOSIS — J4520 Mild intermittent asthma, uncomplicated: Secondary | ICD-10-CM

## 2022-06-22 DIAGNOSIS — L578 Other skin changes due to chronic exposure to nonionizing radiation: Secondary | ICD-10-CM

## 2022-06-22 DIAGNOSIS — R7989 Other specified abnormal findings of blood chemistry: Secondary | ICD-10-CM | POA: Diagnosis not present

## 2022-06-22 DIAGNOSIS — L814 Other melanin hyperpigmentation: Secondary | ICD-10-CM

## 2022-06-22 DIAGNOSIS — Z86018 Personal history of other benign neoplasm: Secondary | ICD-10-CM

## 2022-06-22 DIAGNOSIS — D229 Melanocytic nevi, unspecified: Secondary | ICD-10-CM | POA: Diagnosis not present

## 2022-06-22 DIAGNOSIS — K219 Gastro-esophageal reflux disease without esophagitis: Secondary | ICD-10-CM

## 2022-06-22 DIAGNOSIS — D2261 Melanocytic nevi of right upper limb, including shoulder: Secondary | ICD-10-CM

## 2022-06-22 DIAGNOSIS — G4733 Obstructive sleep apnea (adult) (pediatric): Secondary | ICD-10-CM

## 2022-06-22 DIAGNOSIS — Z124 Encounter for screening for malignant neoplasm of cervix: Secondary | ICD-10-CM

## 2022-06-22 LAB — HEPATIC FUNCTION PANEL
ALT: 28 U/L (ref 0–35)
AST: 19 U/L (ref 0–37)
Albumin: 4.3 g/dL (ref 3.5–5.2)
Alkaline Phosphatase: 82 U/L (ref 39–117)
Bilirubin, Direct: 0.1 mg/dL (ref 0.0–0.3)
Total Bilirubin: 0.6 mg/dL (ref 0.2–1.2)
Total Protein: 6.8 g/dL (ref 6.0–8.3)

## 2022-06-22 LAB — TSH: TSH: 1.39 u[IU]/mL (ref 0.35–5.50)

## 2022-06-22 NOTE — Progress Notes (Signed)
Patient ID: Angela Pacheco, female   DOB: 1966-10-12, 56 y.o.   MRN: [id number]   Subjective:    Patient ID: Angela Pacheco, female    DOB: Jun 19, 1966, 56 y.o.   MRN: [id number]   Patient here for her physical exam.   Chief Complaint  Patient presents with   Follow-up    Complete physical exam   .   HPI Saw ENT - evaluation for oral appliance.  Has deviated septum.  Recommended using flonase.  Has decreased protonix.  Controlling acid reflux.  Tries to stay active.  No chest pain or sob reported.  No abdominal pain or bowel change reported.  Has f/u with gyn 07/2022.  Mammogram scheduled 07/23/22.  Seeing a Social worker.  Helping.  Applying for a new position.  Handling stress.    Past Medical History:  Diagnosis Date   Allergy    cats trigger   Asthma    mild   B12 deficiency 96/29/5284   Complication of anesthesia    Dysplastic nevus 11/01/2017   Right middle shoulder, mod, excision by Dr Phillip Heal   Dysplastic nevus 07/03/2020   L medial scapular back, lat to T5, moderate, excision by Dr Phillip Heal   Dysplastic nevus 06/09/2020   L scapular back lateral, mild   Hypothyroidism    Thyroid disease    Past Surgical History:  Procedure Laterality Date   ABDOMINAL HYSTERECTOMY     cervix left in place   APPENDECTOMY  1991   CHOLECYSTECTOMY     COLONOSCOPY WITH PROPOFOL N/A 10/08/2019   Procedure: COLONOSCOPY WITH PROPOFOL;  Surgeon: Lin Landsman, MD;  Location: [facility] ENDOSCOPY;  Service: Gastroenterology;  Laterality: N/A;   Family History  Problem Relation Age of Onset   Arthritis Mother    Breast cancer Neg Hx    Social History   Socioeconomic History   Marital status: Married    Spouse name: Not on file   Number of children: Not on file   Years of education: Not on file   Highest education level: Not on file  Occupational History   Not on file  Tobacco Use   Smoking status: Never   Smokeless tobacco: Never  Substance and Sexual Activity   Alcohol  use: Yes    Alcohol/week: 0.0 standard drinks of alcohol    Comment: occ.   Drug use: No   Sexual activity: Not on file  Other Topics Concern   Not on file  Social History Narrative   Not on file   Social Determinants of Health   Financial Resource Strain: Not on file  Food Insecurity: Not on file  Transportation Needs: Not on file  Physical Activity: Not on file  Stress: Not on file  Social Connections: Not on file     Review of Systems  Constitutional:  Negative for appetite change and unexpected weight change.  HENT:  Negative for congestion, sinus pressure and sore throat.   Eyes:  Negative for pain and visual disturbance.  Respiratory:  Negative for cough, chest tightness and shortness of breath.   Cardiovascular:  Negative for chest pain, palpitations and leg swelling.  Gastrointestinal:  Negative for abdominal pain, diarrhea, nausea and vomiting.  Genitourinary:  Negative for difficulty urinating and dysuria.  Musculoskeletal:  Negative for joint swelling and myalgias.  Skin:  Negative for color change and rash.  Neurological:  Negative for dizziness, light-headedness and headaches.  Hematological:  Negative for adenopathy. Does not bruise/bleed easily.  Psychiatric/Behavioral:  Negative for agitation  and dysphoric mood.        Objective:     BP 110/72 (BP Location: Left Arm, Patient Position: Sitting, Cuff Size: Normal)   Pulse 74   Temp 98.2 F (36.8 C) (Oral)   Resp 19   Ht 5' (1.524 m)   Wt 131 lb 3.2 oz (59.5 kg)   SpO2 98%   BMI 25.62 kg/m  Wt Readings from Last 3 Encounters:  06/22/22 131 lb 3.2 oz (59.5 kg)  04/23/22 129 lb (58.5 kg)  03/18/22 137 lb 6.4 oz (62.3 kg)    Physical Exam Vitals reviewed.  Constitutional:      General: She is not in acute distress.    Appearance: Normal appearance. She is well-developed.  HENT:     Head: Normocephalic and atraumatic.     Right Ear: External ear normal.     Left Ear: External ear normal.   Eyes:     General: No scleral icterus.       Right eye: No discharge.        Left eye: No discharge.     Conjunctiva/sclera: Conjunctivae normal.  Neck:     Thyroid: No thyromegaly.  Cardiovascular:     Rate and Rhythm: Normal rate and regular rhythm.  Pulmonary:     Effort: No tachypnea, accessory muscle usage or respiratory distress.     Breath sounds: Normal breath sounds. No decreased breath sounds or wheezing.  Chest:  Breasts:    Right: No inverted nipple, mass, nipple discharge or tenderness (no axillary adenopathy).     Left: No inverted nipple, mass, nipple discharge or tenderness (no axilarry adenopathy).  Abdominal:     General: Bowel sounds are normal.     Palpations: Abdomen is soft.     Tenderness: There is no abdominal tenderness.  Musculoskeletal:        General: No swelling or tenderness.     Cervical back: Neck supple.  Lymphadenopathy:     Cervical: No cervical adenopathy.  Skin:    Findings: No erythema or rash.  Neurological:     Mental Status: She is alert and oriented to person, place, and time.  Psychiatric:        Mood and Affect: Mood normal.        Behavior: Behavior normal.      Outpatient Encounter Medications as of 06/22/2022  Medication Sig   albuterol (VENTOLIN HFA) 108 (90 Base) MCG/ACT inhaler INHALE 2 PUFFS INTO THE LUNGS EVERY 6 HOURS AS NEEDED FOR WHEEZING OR SHORTNESS OF BREATH   Calcium Carbonate-Vitamin D (CALTRATE 600+D PO) Take 1 tablet by mouth 2 (two) times daily.   cyanocobalamin (,VITAMIN B-12,) 1000 MCG/ML injection INJECT 1 ML IN THE MUSCLE EVERY 30 DAYS   levothyroxine (SYNTHROID) 100 MCG tablet TAKE 1 TABLET(100 MCG) BY MOUTH DAILY   Magnesium 400 MG CAPS Take 1 tablet by mouth daily at 6 (six) AM.   pantoprazole (PROTONIX) 40 MG tablet Take 1 tablet (40 mg total) by mouth 2 (two) times daily before a meal.   sucralfate (CARAFATE) 1 g tablet Take 1 g by mouth 2 (two) times daily.   SYRINGE-NEEDLE, DISP, 3 ML (BD INTEGRA  SYRINGE) 25G X 1" 3 ML MISC Use as directed with B12 injections   Triamcinolone Acetonide (NASACORT AQ NA) Place into the nose.   vitamin C (ASCORBIC ACID) 500 MG tablet Take 500 mg by mouth daily.   Zinc Sulfate (ZINC-220 PO) Take 1 tablet by mouth daily at 6 (six)  AM.   No facility-administered encounter medications on file as of 06/22/2022.     Lab Results  Component Value Date   WBC 7.3 02/26/2022   HGB 14.5 02/26/2022   HCT 42.6 02/26/2022   PLT 328.0 02/26/2022   GLUCOSE 91 03/17/2022   CHOL 174 03/17/2022   TRIG 88.0 03/17/2022   HDL 51.00 03/17/2022   LDLCALC 105 (H) 03/17/2022   ALT 28 06/22/2022   AST 19 06/22/2022   NA 141 03/17/2022   K 4.0 03/17/2022   CL 108 03/17/2022   CREATININE 1.01 03/17/2022   BUN 17 03/17/2022   CO2 25 03/17/2022   TSH 1.39 06/22/2022    US Abdomen Complete  Result Date: 03/04/2022 CLINICAL DATA:  epigastric pain EXAM: ABDOMEN ULTRASOUND COMPLETE COMPARISON:  None. FINDINGS: Gallbladder: Surgically absent. Common bile duct: Diameter: 5 mm, normal Liver: No focal lesion identified. Diffusely increased hepatic parenchymal echogenicity. Portal vein is patent on color Doppler imaging with normal direction of blood flow towards the liver. IVC: No abnormality visualized. Pancreas: Visualized portion unremarkable. Spleen: Visualized portion is unremarkable. Portions are not well visualized secondary to shadowing bowel gas. Right Kidney: Length: 9.9 cm. Echogenicity within normal limits. No mass or hydronephrosis visualized. Left Kidney: Length: 9.0 cm. Echogenicity within normal limits. No mass or hydronephrosis visualized. Abdominal aorta: No aneurysm visualized. Other findings: None. IMPRESSION: Hepatic steatosis. Electronically Signed   By: Valentino Saxon M.D.   On: 03/04/2022 12:52       Assessment & Plan:   Problem List Items Addressed This Visit     Abnormal liver function test    Slight elevation AST.  Recheck liver panel to confirm  wnl.       Relevant Orders   Hepatic function panel (Completed)   Asthma    Breathing stable.       GERD (gastroesophageal reflux disease)    On lower dose protonix now.  Controlling symptoms.  Follow.       Health care maintenance    Physical today 06/22/22.   Mammogram 07/23/21 - Birads I.  Colonoscopy 09/2019 - one polyp.  Recommended f/u in 7 years.        Hypothyroidism    On thyroid replacement.  Follow tsh.       Relevant Orders   TSH (Completed)   OSA (obstructive sleep apnea)    Saw ENT. Planning trial of an oral device.        Stress    Increased stress as outlined.  Seeing a therapist.  Does not feel needs any further intervention at this time.  Follow.       Other Visit Diagnoses     Routine general medical examination at a health care facility    -  Primary   Screening for cervical cancer            [name], MD

## 2022-06-23 NOTE — Telephone Encounter (Signed)
Seems like encounter was open in error so closing encounter.  

## 2022-06-27 ENCOUNTER — Encounter: Payer: Self-pay | Admitting: Internal Medicine

## 2022-06-27 NOTE — Assessment & Plan Note (Signed)
On lower dose protonix now.  Controlling symptoms.  Follow.

## 2022-06-27 NOTE — Assessment & Plan Note (Signed)
Increased stress as outlined.  Seeing a therapist.  Does not feel needs any further intervention at this time.  Follow.

## 2022-06-27 NOTE — Assessment & Plan Note (Signed)
On thyroid replacement.  Follow tsh.  

## 2022-06-27 NOTE — Assessment & Plan Note (Signed)
Saw ENT. Planning trial of an oral device.

## 2022-06-27 NOTE — Assessment & Plan Note (Signed)
Breathing stable.

## 2022-06-27 NOTE — Assessment & Plan Note (Signed)
Slight elevation AST.  Recheck liver panel to confirm wnl.

## 2022-06-28 ENCOUNTER — Ambulatory Visit: Payer: BC Managed Care – PPO | Attending: Internal Medicine | Admitting: Physical Therapy

## 2022-06-28 ENCOUNTER — Encounter: Payer: Self-pay | Admitting: Physical Therapy

## 2022-06-28 DIAGNOSIS — R293 Abnormal posture: Secondary | ICD-10-CM | POA: Insufficient documentation

## 2022-06-28 DIAGNOSIS — R278 Other lack of coordination: Secondary | ICD-10-CM | POA: Diagnosis present

## 2022-06-28 DIAGNOSIS — M6281 Muscle weakness (generalized): Secondary | ICD-10-CM | POA: Insufficient documentation

## 2022-06-28 NOTE — Therapy (Signed)
OUTPATIENT PHYSICAL THERAPY FEMALE PELVIC EVALUATION   Patient Name: Angela Pacheco MRN: [id number] DOB:1966/04/24, 56 y.o., female Today's Date: 06/28/2022   PT End of Session - 06/28/22 1034     Visit Number 1    Number of Visits 12    Date for PT Re-Evaluation 09/20/22    Authorization Type IE 06/28/2022    PT Start Time 1035    PT Stop Time 1115    PT Time Calculation (min) 40 min    Activity Tolerance Patient tolerated treatment well    Behavior During Therapy [facility] for tasks assessed/performed             Past Medical History:  Diagnosis Date   Allergy    cats trigger   Asthma    mild   B12 deficiency 31/54/0086   Complication of anesthesia    Dysplastic nevus 11/01/2017   Right middle shoulder, mod, excision by Dr Phillip Heal   Dysplastic nevus 07/03/2020   L medial scapular back, lat to T5, moderate, excision by Dr Phillip Heal   Dysplastic nevus 06/09/2020   L scapular back lateral, mild   Hypothyroidism    Thyroid disease    Past Surgical History:  Procedure Laterality Date   ABDOMINAL HYSTERECTOMY     cervix left in place   ANTERIOR CRUCIATE LIGAMENT REPAIR Left 2017   APPENDECTOMY  12/27/1989   CHOLECYSTECTOMY     COLONOSCOPY WITH PROPOFOL N/A 10/08/2019   Procedure: COLONOSCOPY WITH PROPOFOL;  Surgeon: Lin Landsman, MD;  Location: [facility] ENDOSCOPY;  Service: Gastroenterology;  Laterality: N/A;   Patient Active Problem List   Diagnosis Date Noted   Abnormal liver function test 06/22/2022   OSA (obstructive sleep apnea) 05/06/2022   Dizziness 04/18/2022   Daytime sleepiness 03/15/2022   Epigastric pain 02/26/2022   Decreased hearing 01/24/2022   Fatigue 01/24/2022   GERD (gastroesophageal reflux disease) 01/24/2022   Status post shoulder surgery 11/24/2021   Stress 10/11/2021   COVID-19 virus infection 07/13/2021   Right foot pain 04/03/2021   UTI (urinary tract infection) 02/14/2021   Impingement syndrome of left shoulder region 09/22/2020    Tingling in extremities 09/14/2020   Axillary fullness 06/18/2020   Sinusitis 03/16/2020   Shoulder pain 01/01/2020   Neck fullness 01/20/2019   Family history of lupus erythematosus 10/04/2018   Positive ANA (antinuclear antibody) 10/04/2018   Joint pain 09/15/2018   Immunity to measles determined by serologic test 06/30/2018   Abnormal mammogram 07/07/2016   Overweight 02/11/2016   Health care maintenance 04/05/2015   Environmental allergies 01/17/2015   Asthma 01/17/2015   Menopausal symptoms 01/17/2015   Endometriosis 01/17/2015   Hypothyroidism 01/14/2015    PCP: [name], MD  REFERRING PROVIDER: Benjaman Kindler, MD   REFERRING DIAG: M62.89 (ICD-10-CM) - Pelvic floor dysfunction   THERAPY DIAG:  Muscle weakness (generalized)  Other lack of coordination  Abnormal posture  Rationale for Evaluation and Treatment Rehabilitation  PRECAUTIONS: None  WEIGHT BEARING RESTRICTIONS No  FALLS:  Has patient fallen in last 6 months? Yes. Number of falls 1; dog pulled down steps; no known unsteadiness/changes to balance  ONSET DATE: 2022  SUBJECTIVE:  CHIEF COMPLAINT: Patient reports that she has a lot of urgency and while able to hold it for short amounts of time (30 min to 1 hour). Patient notes that she was prescribed topical estrogen for management of menopausal symptoms which has also helped with some urgency. Patient notes that with recent fall she landed on her backside and had substantial loss of urine. Patient also had complete loss of bladder when she ran into a railing that hit her suprapubically and caused her to fold at the hips. Patient endorses SUI with coughing, sneezing, and moderate to high impact exercises. Patient notes significant and constant hip and low back tightness;  patient states that she does not stretch after workouts and this might be a factor.    PERTINENT HISTORY/CHART REVIEW:  Red flags (bowel/bladder changes, saddle paresthesia, personal history of cancer, h/o spinal tumors, h/o compression fx, h/o abdominal aneurysm, abdominal pain, chills/fever, night sweats, nausea, vomiting, unrelenting pain, first onset of insidious LBP <20 y/o): Negative   PAIN:  Are you having pain? No   OCCUPATION/LEISURE ACTIVITIES:  Peloton, walk, yoga, strength training, aquatics, cooking, socializing   PLOF:  Independent  PATIENT GOALS: Improve symptoms  OBSTETRICAL HISTORY: G2P2 Deliveries: SVD Tearing/Episiotomy: moderate tearing (no known grade)  GYNECOLOGICAL HISTORY: Hysterectomy: Yes Abdominal Endometriosis: Positive Pain with exam: No  Prolapse: None Heaviness/pressure: No   UROLOGICAL HISTORY: Frequency of urination: every 1-2 hours (off); every 3-6 hours Incontinence: Urge to void, Walking to the bathroom, Coughing, Sneezing, Exercise, and Intercourse  Amount: Min/Mod Protective undergarments: Yes   Type: pantyliner; Number used/day: 1x Fluid Intake: ~64 oz H20, 1 cup matcha, occasional coffee decaffeinated, occasional alcoholic seltzer, no juices, 1 seltzer/day Nocturia: 0-2x/night Incomplete emptying: No  Pain with urination: Negative Stream: Strong Urgency: Yes  Difficulty initiating urination: Negative Intermittent stream: Negative Frequent UTI: Positive for before topical estrogen.   GASTROINTESTINAL HISTORY: Type of bowel movement: (Bristol Stool Scale) 3-4 Frequency of BMs: 1x/day Incomplete bowel movement: No  Pain with defecation: Negative Straining with defecation: Negative Fiber supplement: No  Incontinence: Negative.    SEXUAL HISTORY AND FUNCTION: Sexually active: Yes  Pain with penetration: N/A with topical estrogen   OBJECTIVE:  DIAGNOSTIC TESTING/IMAGING: none  COGNITION:  Patient is oriented to  person, place, and time.  Recent memory is intact.  Remote memory is intact.  Attention span and concentration are intact.  Expressive speech is intact.  Patient's fund of knowledge is within normal limits for educational level.    POSTURE/OBSERVATIONS:   Lumbar lordosis: diminished in standing Thoracic kyphosis: increased in standing and sitting Iliac crest height: not formally assessed  Lumbar lateral shift: not formally assessed  Pelvic obliquity: not formally assessed  Leg length discrepancy: not formally assessed    GAIT:  Grossly WNL; mildly decreased hip extension B.   RANGE OF MOTION: deferred 2/2 to time constraints  AROM (Normal range in degrees) AROM  06/28/2022  Lumbar   Flexion (65)   Extension (30)   Right lateral flexion (25)   Left lateral flexion (25)   Right rotation (30)   Left rotation (30)       Hip LEFT RIGHT  Flexion (125)    Extension (15)    Abduction (40)    Adduction     Internal Rotation (45)    External Rotation (45)    (* = pain; blank rows = not tested)   SENSATION: deferred 2/2 to time constraints  Grossly intact to light touch bilateral LEs as determined by  testing dermatomes L2-S2 Proprioception and hot/cold testing deferred on this date   STRENGTH: MMT deferred 2/2 to time constraints   RLE LLE  Hip Flexion    Hip Extension    Hip Abduction     Hip Adduction     Hip ER     Hip IR     Knee Extension    Knee Flexion    Dorsiflexion     Plantarflexion (seated)    (* = pain; blank rows = not tested)   MUSCLE LENGTH: deferred 2/2 to time constraints   ABDOMINAL: deferred 2/2 to time constraints  Palpation: Diastasis: Scar mobility: Rib flare:   SPECIAL TESTS: deferred 2/2 to time constraints   PALPATION: deferred 2/2 to time constraints  LOCATION LEFT  RIGHT           Lumbar paraspinals    Quadratus Lumborum    Gluteus Maximus    Gluteus Medius    Deep hip external rotators    PSIS    Fortin's Area  (SIJ)    Greater Trochanter    ASIS    Sacral border    Coccyx    Ischial tuberosity    (blank rows = not tested) Graded on 0-4 scale (0 = no pain, 1 = pain, 2 = pain with wincing/grimacing/flinching, 3 = pain with withdrawal, 4 = unwilling to allow palpation)   PHYSICAL PERFORMANCE MEASURES:  STS: WNL Deep Squat: RLE STS: LLE STS:  6MWT: 5TSTS:     EXTERNAL PELVIC EXAM: deferred 2/2 to time constraints None given; testing deferred to later date  Breath coordination: Voluntary Contraction: present/absent Relaxation: full/delayed/non-relaxing Perineal movement with sustained IAP increase ("bear down"): descent/no change/elevation/excessive descent Perineal movement with rapid IAP increase ("cough"): elevation/no change/descent Palpation of bulbocavernosus: Palpation of ischiocavernosus: Palpation of pubic symphysis: Palpation of superficial transverse perineal:   INTERNAL VAGINAL EXAM: deferred 2/2 to time constraints None given; testing deferred to later date  Introitus Appears:  Skin integrity:  Scar mobility: Strength (PERF):  Symmetry: Palpation: Prolapse: (0 no contraction, 1 flicker, 2 weak squeeze and no lift, 3 fair squeeze and definite lift, 4 good squeeze and lift against resistance, 5 strong squeeze against strong resistance)   PATIENT EDUCATION:  Patient educated on prognosis, POC, and provided with HEP including: bladder irritants and bladder diary. Patient articulated understanding and returned demonstration. Patient will benefit from further education in order to maximize compliance and understanding for long-term therapeutic gains.   PATIENT SURVEYS:  FOTO Urinary 59, PFDI Urinary 13  ASSESSMENT:  Clinical Impression: Patient is a 56 year old presenting to clinic with chief complaints of urinary urgency and incontinence. Today's evaluation is suggestive of deficits in PFM coordination, PFM strength, IAP management, bladder behaviors, and posture  as evidenced by urinary frequency up to every hour, UI with stress and urge components, posteriorly tilted pelvis with increased thoracic kyphosis. Patient's responses on FOTO outcome measures (Urinary 59, PFDI Urinary 13) indicate significant functional limitations/disability/distress. Patient's progress may be limited due to persistence of complaint; however, patient's motivation and fitness are advantageous. Patient was able to achieve basic understanding of bladder irritants during today's evaluation and responded positively to educational interventions. Patient will benefit from continued skilled therapeutic intervention to address deficits in PFM coordination, PFM strength, IAP management, bladder behaviors, and posture in order to increase function and improve overall QOL.   Objective impairments: Abnormal gait, decreased activity tolerance, decreased coordination, decreased endurance, decreased strength, improper body mechanics, and postural dysfunction.   Activity limitations:  meal prep, cleaning, interpersonal relationship, shopping, community activity, and occupation.   Personal factors: Behavior pattern, Past/current experiences, Profession, Time since onset of injury/illness/exacerbation, and 3+ comorbidities: asthma, OSA, hypothyroidism, endometriosis, GERD, fatigue, epigastric pain  are also affecting patient's functional outcome.   Rehab Potential: Good  Clinical decision making: Evolving/moderate complexity  Evaluation complexity: Moderate   GOALS: Goals reviewed with patient? Yes  LONG TERM GOALS: Target date: 09/20/2022  Patient will demonstrate independence with HEP in order to maximize therapeutic gains and improve carryover from physical therapy sessions to ADLs in the home and community. Baseline: not initiated Goal status: INITIAL  Patient will demonstrate improved function as evidenced by a score of 65 on FOTO measure for full participation in activities at home and  in the community.  Baseline: 59 Goal status: INITIAL  Patient will demonstrate improved function as evidenced by a score of 0 on PFDI measure for full participation in activities at home and in the community. Baseline: 13 Goal status: INITIAL  Patient will demonstrate circumferential and sequential contraction of >3/5 MMT, > 5 sec hold x5 and 5 consecutive quick flicks with </= 10 min rest between testing bouts, and relaxation of the PFM coordinated with breath for improved management of intra-abdominal pressure and normal bowel and bladder function without the presence of pain nor incontinence in order to improve participation at home and in the community. Baseline: not formally assessed  Goal status: INITIAL   PLAN: Rehab frequency: 1x/week  Rehab duration: 12 weeks  Planned interventions: Therapeutic exercises, Therapeutic activity, Neuromuscular re-education, Balance training, Gait training, Patient/Family education, Joint mobilization, Orthotic/Fit training, Electrical stimulation, Spinal mobilization, Cryotherapy, Moist heat, scar mobilization, Taping, and Manual therapy    Myles Gip PT, DPT [phone]  06/28/2022, 10:41 AM

## 2022-07-01 ENCOUNTER — Encounter: Payer: Self-pay | Admitting: Internal Medicine

## 2022-07-01 NOTE — Telephone Encounter (Signed)
Pt sched for placement 7/7 , sched for read 7/10 Will bring paperwork 7/7

## 2022-07-01 NOTE — Telephone Encounter (Signed)
If her last TB skin test was 2021, she will need another one for their form.  Ok to schedule TB skin test.  Schedule on a day where can be read in 48-72 hours.  (Will have to return to have read). Can bring paper work with her and drop off and see what is needed to complete

## 2022-07-02 ENCOUNTER — Ambulatory Visit (INDEPENDENT_AMBULATORY_CARE_PROVIDER_SITE_OTHER): Payer: BC Managed Care – PPO

## 2022-07-02 DIAGNOSIS — Z111 Encounter for screening for respiratory tuberculosis: Secondary | ICD-10-CM

## 2022-07-02 NOTE — Progress Notes (Signed)
Patient came in today for PPD placement given in left forearm with wheel obtained. Patient is scheduled Monday for PPD read. Patient also brought in needed paperwork & was given to [facility] for filling out since patient also had recent physical.

## 2022-07-05 ENCOUNTER — Ambulatory Visit: Payer: BC Managed Care – PPO

## 2022-07-05 LAB — TB SKIN TEST
Induration: 0 mm
TB Skin Test: NEGATIVE

## 2022-07-05 NOTE — Progress Notes (Signed)
Pt arrived for PPD reading on L arm, reading was negative 0 mm of induration. Luna Fuse CMA was notified of reading & would be completing paperwork for pt. Pt was instructed to wait for Luna Fuse in lobby due to Dr.Scott needing to sign off on paperwork.

## 2022-07-06 ENCOUNTER — Ambulatory Visit: Payer: BC Managed Care – PPO | Admitting: Physical Therapy

## 2022-07-06 ENCOUNTER — Encounter: Payer: Self-pay | Admitting: Physical Therapy

## 2022-07-06 DIAGNOSIS — R278 Other lack of coordination: Secondary | ICD-10-CM

## 2022-07-06 DIAGNOSIS — M6281 Muscle weakness (generalized): Secondary | ICD-10-CM

## 2022-07-06 DIAGNOSIS — R293 Abnormal posture: Secondary | ICD-10-CM

## 2022-07-06 NOTE — Patient Instructions (Signed)
Access Code: [email or web address] URL: [email or web address] Date: 07/06/2022 Prepared by: Myles Gip  Exercises - Supine Figure 4 Piriformis Stretch  - 30s- 2 m hold - Supine Hamstring Stretch  - 30 s- 2 m hold - Modified Thomas Stretch  - 30 s- 2 m hold - Double Leg Hamstring Stretch at Wall  - 30 s- 2 m hold - Supine Figure 4 Stretch at Wall  - 30s- 2 m hold - Stretching Adductors  - 30 s- 2 m hold

## 2022-07-06 NOTE — Therapy (Signed)
OUTPATIENT PHYSICAL THERAPY FEMALE PELVIC TREATMENT   Patient Name: Angela Pacheco MRN: [id number] DOB:11/25/1966, 56 y.o., female Today's Date: 07/06/2022   PT End of Session - 07/06/22 0937     Visit Number 2    Number of Visits 12    Date for PT Re-Evaluation 09/20/22    Authorization Type IE 06/28/2022    PT Start Time 0940    PT Stop Time 1035    PT Time Calculation (min) 55 min    Activity Tolerance Patient tolerated treatment well    Behavior During Therapy [facility] for tasks assessed/performed             Past Medical History:  Diagnosis Date   Allergy    cats trigger   Asthma    mild   B12 deficiency 65/78/4696   Complication of anesthesia    Dysplastic nevus 11/01/2017   Right middle shoulder, mod, excision by Dr Phillip Heal   Dysplastic nevus 07/03/2020   L medial scapular back, lat to T5, moderate, excision by Dr Phillip Heal   Dysplastic nevus 06/09/2020   L scapular back lateral, mild   Hypothyroidism    Thyroid disease    Past Surgical History:  Procedure Laterality Date   ABDOMINAL HYSTERECTOMY     cervix left in place   ANTERIOR CRUCIATE LIGAMENT REPAIR Left 2017   APPENDECTOMY  12/27/1989   CHOLECYSTECTOMY     COLONOSCOPY WITH PROPOFOL N/A 10/08/2019   Procedure: COLONOSCOPY WITH PROPOFOL;  Surgeon: Lin Landsman, MD;  Location: [place];  Service: Gastroenterology;  Laterality: N/A;   Patient Active Problem List   Diagnosis Date Noted   Abnormal liver function test 06/22/2022   OSA (obstructive sleep apnea) 05/06/2022   Dizziness 04/18/2022   Daytime sleepiness 03/15/2022   Epigastric pain 02/26/2022   Decreased hearing 01/24/2022   Fatigue 01/24/2022   GERD (gastroesophageal reflux disease) 01/24/2022   Status post shoulder surgery 11/24/2021   Stress 10/11/2021   COVID-19 virus infection 07/13/2021   Right foot pain 04/03/2021   UTI (urinary tract infection) 02/14/2021   Impingement syndrome of left shoulder region 09/22/2020    Tingling in extremities 09/14/2020   Axillary fullness 06/18/2020   Sinusitis 03/16/2020   Shoulder pain 01/01/2020   Neck fullness 01/20/2019   Family history of lupus erythematosus 10/04/2018   Positive ANA (antinuclear antibody) 10/04/2018   Joint pain 09/15/2018   Immunity to measles determined by serologic test 06/30/2018   Abnormal mammogram 07/07/2016   Overweight 02/11/2016   Health care maintenance 04/05/2015   Environmental allergies 01/17/2015   Asthma 01/17/2015   Menopausal symptoms 01/17/2015   Endometriosis 01/17/2015   Hypothyroidism 01/14/2015    PCP: [name], MD  REFERRING PROVIDER: Benjaman Kindler, MD   REFERRING DIAG: M62.89 (ICD-10-CM) - Pelvic floor dysfunction   THERAPY DIAG:  Muscle weakness (generalized)  Other lack of coordination  Abnormal posture  Rationale for Evaluation and Treatment Rehabilitation  PRECAUTIONS: None  WEIGHT BEARING RESTRICTIONS No  FALLS:  Has patient fallen in last 6 months? Yes. 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 Patient reports that her insurance will not change until the end of August, and notes that she will return to work mid-August and is hopeful to have completed PT by that time.  Patient did keep bladder diary for 3 days; she did not noticed any general trends, but reports largely consistent emptying > 8 seconds of stream time.    PERTINENT HISTORY/CHART REVIEW:  Red flags (bowel/bladder changes, saddle paresthesia, personal history of cancer, h/o spinal tumors, h/o compression fx, h/o abdominal aneurysm, abdominal pain, chills/fever, night sweats, nausea, vomiting, unrelenting pain,  first onset of insidious LBP <20 y/o): Negative   PAIN:  Are you having pain? No   OCCUPATION/LEISURE ACTIVITIES:  Peloton, walk, yoga, strength training, aquatics, cooking, socializing   PLOF:  Independent  PATIENT GOALS: Improve symptoms   TREATMENT  Pre-treatment assessment:  RANGE OF MOTION:   AROM (Normal range in degrees) AROM  07/06/2022  Lumbar   Flexion (65) WNL  Extension (30) WNL  Right lateral flexion (25) WNL  Left lateral flexion (25) WNL  Right rotation (30) WNL  Left rotation (30) WNL      Hip LEFT RIGHT  Flexion (125) WNL WNL  Extension (15) WNL WNL  Abduction (40) WNL WNL  Adduction  WNL WNL  Internal Rotation (45) WNL WNL  External Rotation (45) WNL WNL  (* = pain; blank rows = not tested)   STRENGTH: MMT    RLE LLE  Hip Flexion    Hip Extension 5 5  Hip Abduction  5 5  Hip Adduction  5 5  Hip ER  5 5  Hip IR  5 5  Knee Extension 5 5  Knee Flexion 5 5  Dorsiflexion  5 5  Plantarflexion (seated) 5 5  (* = pain; blank rows = not tested)  ABDOMINAL:  Palpation:  Diastasis: Scar mobility: Rib flare:  SPECIAL TESTS:   SLR (SN 92, -LR 0.29): R: Negative L:  Negative  FABER (SN 81): R: Negative L: Negative FADIR (SN 94): R: Negative L: Negative    PALPATION:  LOCATION LEFT  RIGHT           Lumbar paraspinals    Quadratus Lumborum    Gluteus Maximus    Gluteus Medius    Deep hip external rotators    PSIS    Fortin's Area (SIJ)    Greater Trochanter    ASIS 1 1  Psoas 3 3  Suprapubic 2   Ischial tuberosity    (blank rows = not tested) Graded on 0-4 scale (0 = no pain, 1 = pain, 2 = pain with wincing/grimacing/flinching, 3 = pain with withdrawal, 4 = unwilling to allow palpation)   EXTERNAL PELVIC EXAM:  Patient educated on the purpose of the procedure/exam and articulated understanding and consented to the procedure/exam. and verbal  Breath coordination: present, required 5-10 breaths to achieve/assess Voluntary  Contraction: present (roughly 3-4/5MMT); able to coordinate 2 reps (deferred further testing to difficulty releasing contraction) Relaxation: delayed Perineal movement with sustained IAP increase ("bear down"): descent Perineal movement with rapid IAP increase ("cough"): no change   Manual Therapy:   Neuromuscular Re-education: Patient performed hip stretching interventions for improved posture and decreased PFM tension/improved coordination of relaxation: - Supine Figure 4 Piriformis Stretch  - Supine Hamstring Stretch  - Modified Thomas Stretch   Patent educated on hip stretches at wall for improved tissue length and decreased PFM tension: - Double Leg Hamstring  Stretch at [place]  - 30 s- 2 m hold - Supine Figure 4 Stretch at Wall  - 30s- 2 m hold - Stretching Adductors  - 30 s- 2 m hold  Therapeutic Exercise:   Treatments unbilled:  Post-treatment assessment:  Patient educated throughout session on appropriate technique and form using multi-modal cueing, HEP, and activity modification. Patient articulated understanding and returned demonstration.  Patient Response to interventions: Comfortable to return in 1 week for deep core and PFM interventions    PATIENT SURVEYS:  FOTO Urinary 59, PFDI Urinary 13  ASSESSMENT:  Clinical Impression: Patient presents to clinic with excellent motivation to participate in therapy. Patient demonstrates deficits in PFM coordination, PFM strength, IAP management, bladder behaviors, and posture. Review of bladder diary confirms frequency of urination, but based on data collected, patient appears to be voiding significant amount each time despite limited filling time between voids. Overall water consumption is adequate, but not excessive. Examination of PFM and abdomen today suggests difficulty coordinating relaxation phase of PFM contraction and increased time to release contraction required, as well as increased tenderness and tension in B psoas  mm. Both are likely contributing factors to the frequent urge to urinate and duration of stream with each void. Patient able to achieve basic understanding of hip stretches during today's session and responded positively to active and educational interventions. Patient will benefit from continued skilled therapeutic intervention to address remaining deficits in PFM coordination, PFM strength, IAP management, bladder behaviors, and posture in order to increase function and improve overall QOL.    Objective impairments: Abnormal gait, decreased activity tolerance, decreased coordination, decreased endurance, decreased strength, improper body mechanics, and postural dysfunction.   Activity limitations: meal prep, cleaning, interpersonal relationship, shopping, community activity, and occupation.   Personal factors: Behavior pattern, Past/current experiences, Profession, Time since onset of injury/illness/exacerbation, and 3+ comorbidities: asthma, OSA, hypothyroidism, endometriosis, GERD, fatigue, epigastric pain  are also affecting patient's functional outcome.   Rehab Potential: Good  Clinical decision making: Evolving/moderate complexity  Evaluation complexity: Moderate   GOALS: Goals reviewed with patient? Yes  LONG TERM GOALS: Target date: 09/20/2022  Patient will demonstrate independence with HEP in order to maximize therapeutic gains and improve carryover from physical therapy sessions to ADLs in the home and community. Baseline: not initiated Goal status: INITIAL  Patient will demonstrate improved function as evidenced by a score of 65 on FOTO measure for full participation in activities at home and in the community.  Baseline: 59 Goal status: INITIAL  Patient will demonstrate improved function as evidenced by a score of 0 on PFDI measure for full participation in activities at home and in the community. Baseline: 13 Goal status: INITIAL  Patient will demonstrate circumferential  and sequential contraction of >3/5 MMT, > 5 sec hold x5 and 5 consecutive quick flicks with </= 10 min rest between testing bouts, and relaxation of the PFM coordinated with breath for improved management of intra-abdominal pressure and normal bowel and bladder function without the presence of pain nor incontinence in order to improve participation at home and in the community. Baseline: not formally assessed  Goal status: INITIAL   PLAN: Rehab frequency: 1x/week  Rehab duration: 12 weeks  Planned interventions: Therapeutic exercises, Therapeutic activity, Neuromuscular re-education, Balance training, Gait training, Patient/Family education, Joint mobilization, Orthotic/Fit training, Electrical stimulation, Spinal mobilization, Cryotherapy, Moist heat, scar mobilization, Taping, and Manual therapy    Myles Gip PT, DPT [phone]  07/06/2022, 11:04 AM

## 2022-07-13 ENCOUNTER — Encounter: Payer: Self-pay | Admitting: Physical Therapy

## 2022-07-13 ENCOUNTER — Ambulatory Visit: Payer: BC Managed Care – PPO | Admitting: Physical Therapy

## 2022-07-13 DIAGNOSIS — R293 Abnormal posture: Secondary | ICD-10-CM

## 2022-07-13 DIAGNOSIS — M6281 Muscle weakness (generalized): Secondary | ICD-10-CM

## 2022-07-13 DIAGNOSIS — R278 Other lack of coordination: Secondary | ICD-10-CM

## 2022-07-13 NOTE — Therapy (Signed)
OUTPATIENT PHYSICAL THERAPY FEMALE PELVIC TREATMENT   Patient Name: Angela Pacheco MRN: [id number] DOB:1966-12-11, 56 y.o., female Today's Date: 07/13/2022   PT End of Session - 07/13/22 0946     Visit Number 3    Number of Visits 12    Date for PT Re-Evaluation 09/20/22    Authorization Type IE 06/28/2022    PT Start Time 0945    PT Stop Time 1025    PT Time Calculation (min) 40 min    Activity Tolerance Patient tolerated treatment well    Behavior During Therapy [facility] for tasks assessed/performed             Past Medical History:  Diagnosis Date   Allergy    cats trigger   Asthma    mild   B12 deficiency 86/38/1771   Complication of anesthesia    Dysplastic nevus 11/01/2017   Right middle shoulder, mod, excision by Dr Phillip Heal   Dysplastic nevus 07/03/2020   L medial scapular back, lat to T5, moderate, excision by Dr Phillip Heal   Dysplastic nevus 06/09/2020   L scapular back lateral, mild   Hypothyroidism    Thyroid disease    Past Surgical History:  Procedure Laterality Date   ABDOMINAL HYSTERECTOMY     cervix left in place   ANTERIOR CRUCIATE LIGAMENT REPAIR Left 2017   APPENDECTOMY  12/27/1989   CHOLECYSTECTOMY     COLONOSCOPY WITH PROPOFOL N/A 10/08/2019   Procedure: COLONOSCOPY WITH PROPOFOL;  Surgeon: Lin Landsman, MD;  Location: [place];  Service: Gastroenterology;  Laterality: N/A;   Patient Active Problem List   Diagnosis Date Noted   Abnormal liver function test 06/22/2022   OSA (obstructive sleep apnea) 05/06/2022   Dizziness 04/18/2022   Daytime sleepiness 03/15/2022   Epigastric pain 02/26/2022   Decreased hearing 01/24/2022   Fatigue 01/24/2022   GERD (gastroesophageal reflux disease) 01/24/2022   Status post shoulder surgery 11/24/2021   Stress 10/11/2021   COVID-19 virus infection 07/13/2021   Right foot pain 04/03/2021   UTI (urinary tract infection) 02/14/2021   Impingement syndrome of left shoulder region 09/22/2020    Tingling in extremities 09/14/2020   Axillary fullness 06/18/2020   Sinusitis 03/16/2020   Shoulder pain 01/01/2020   Neck fullness 01/20/2019   Family history of lupus erythematosus 10/04/2018   Positive ANA (antinuclear antibody) 10/04/2018   Joint pain 09/15/2018   Immunity to measles determined by serologic test 06/30/2018   Abnormal mammogram 07/07/2016   Overweight 02/11/2016   Health care maintenance 04/05/2015   Environmental allergies 01/17/2015   Asthma 01/17/2015   Menopausal symptoms 01/17/2015   Endometriosis 01/17/2015   Hypothyroidism 01/14/2015    PCP: [name], MD  REFERRING PROVIDER: Benjaman Kindler, MD   REFERRING DIAG: M62.89 (ICD-10-CM) - Pelvic floor dysfunction   THERAPY DIAG:  Other lack of coordination  Muscle weakness (generalized)  Abnormal posture  Rationale for Evaluation and Treatment Rehabilitation  PRECAUTIONS: None  WEIGHT BEARING RESTRICTIONS No  FALLS:  Has patient fallen in last 6 months? Yes. Number of falls 1; dog pulled down steps; no known unsteadiness/changes to balance  ONSET DATE: 2022  SUBJECTIVE:  Patient reports that she is having difficulty breathing due to heat and air quality today, but has used inhaler. Patient has been doing stretches once-a-day and has felt relief in L hip and lumbar region. Patient notes that she feels a little sore in L lumbar region but attributes this to her workout yesterday.    PERTINENT HISTORY/CHART REVIEW:  Red flags (bowel/bladder changes, saddle paresthesia, personal history of cancer, h/o spinal tumors, h/o compression fx, h/o abdominal aneurysm, abdominal pain, chills/fever, night sweats, nausea, vomiting, unrelenting pain, first onset of insidious LBP <20 y/o): Negative   PAIN:  Are you having  pain? No   OCCUPATION/LEISURE ACTIVITIES:  Peloton, walk, yoga, strength training, aquatics, cooking, socializing   PLOF:  Independent  PATIENT GOALS: Improve symptoms   TREATMENT  Pre-treatment assessment:  ABDOMINAL:  Palpation:  Diastasis: none noted on testing Scar mobility: Rib flare: present  Manual Therapy:   Neuromuscular Re-education: Patient participated in supine hooklying postural interventions for improved force management in the pelvis and spine:  Scapular isolations with TrA for rib flare correction 3x15  Small march with TrA x10 each leg  Modified dead bug x10 each side  Prone prop with TrA x30 sec PT assisted stretches of hip flexors in prone position for improved posture.  Therapeutic Exercise:   Treatments unbilled:  Post-treatment assessment:  Patient educated throughout session on appropriate technique and form using multi-modal cueing, HEP, and activity modification. Patient articulated understanding and returned demonstration.  Patient Response to interventions: Comfortable to return in 1 week for PFM interventions    PATIENT SURVEYS:  FOTO Urinary 59, PFDI Urinary 13  ASSESSMENT:  Clinical Impression: Patient presents to clinic with excellent motivation to participate in therapy. Patient demonstrates deficits in PFM coordination, PFM strength, IAP management, bladder behaviors, and posture. Patient with excellent coordination of deep core stabilization interventions during today's session and responded positively to elongation of B hip flexors. Patient will benefit from continued skilled therapeutic intervention to address remaining deficits in PFM coordination, PFM strength, IAP management, bladder behaviors, and posture in order to increase function and improve overall QOL.    Objective impairments: Abnormal gait, decreased activity tolerance, decreased coordination, decreased endurance, decreased strength, improper body mechanics,  and postural dysfunction.   Activity limitations: meal prep, cleaning, interpersonal relationship, shopping, community activity, and occupation.   Personal factors: Behavior pattern, Past/current experiences, Profession, Time since onset of injury/illness/exacerbation, and 3+ comorbidities: asthma, OSA, hypothyroidism, endometriosis, GERD, fatigue, epigastric pain  are also affecting patient's functional outcome.   Rehab Potential: Good  Clinical decision making: Evolving/moderate complexity  Evaluation complexity: Moderate   GOALS: Goals reviewed with patient? Yes  LONG TERM GOALS: Target date: 09/20/2022  Patient will demonstrate independence with HEP in order to maximize therapeutic gains and improve carryover from physical therapy sessions to ADLs in the home and community. Baseline: not initiated Goal status: INITIAL  Patient will demonstrate improved function as evidenced by a score of 65 on FOTO measure for full participation in activities at home and in the community.  Baseline: 59 Goal status: INITIAL  Patient will demonstrate improved function as evidenced by a score of 0 on PFDI measure for full participation in activities at home and in the community. Baseline: 13 Goal status: INITIAL  Patient will demonstrate circumferential and sequential contraction of >3/5 MMT, > 5 sec hold x5 and 5 consecutive quick flicks with </= 10 min rest between testing bouts, and relaxation of the PFM coordinated with breath for improved management of intra-abdominal pressure  and normal bowel and bladder function without the presence of pain nor incontinence in order to improve participation at home and in the community. Baseline: not formally assessed  Goal status: INITIAL   PLAN: Rehab frequency: 1x/week  Rehab duration: 12 weeks  Planned interventions: Therapeutic exercises, Therapeutic activity, Neuromuscular re-education, Balance training, Gait training, Patient/Family education,  Joint mobilization, Orthotic/Fit training, Electrical stimulation, Spinal mobilization, Cryotherapy, Moist heat, scar mobilization, Taping, and Manual therapy    Myles Gip PT, DPT [phone]  07/13/2022, 9:47 AM

## 2022-07-18 ENCOUNTER — Telehealth: Payer: BC Managed Care – PPO | Admitting: Nurse Practitioner

## 2022-07-18 DIAGNOSIS — J329 Chronic sinusitis, unspecified: Secondary | ICD-10-CM | POA: Diagnosis not present

## 2022-07-18 DIAGNOSIS — B9689 Other specified bacterial agents as the cause of diseases classified elsewhere: Secondary | ICD-10-CM | POA: Diagnosis not present

## 2022-07-18 MED ORDER — AMOXICILLIN-POT CLAVULANATE 875-125 MG PO TABS: 1.0000 | ORAL_TABLET | Freq: Two times a day (BID) | ORAL | 0 refills | Status: AC

## 2022-07-18 NOTE — Progress Notes (Signed)

## 2022-07-18 NOTE — Progress Notes (Signed)
I have spent 5 minutes in review of e-visit questionnaire, review and updating patient chart, medical decision making and response to patient.  ° °Lamaya Hyneman W Kenadie Royce, NP ° °  °

## 2022-07-20 ENCOUNTER — Ambulatory Visit: Payer: BC Managed Care – PPO | Admitting: Physical Therapy

## 2022-07-20 ENCOUNTER — Telehealth: Payer: BC Managed Care – PPO | Admitting: Family Medicine

## 2022-07-20 DIAGNOSIS — J3489 Other specified disorders of nose and nasal sinuses: Secondary | ICD-10-CM

## 2022-07-20 MED ORDER — PREDNISONE 10 MG PO TABS: 40.0000 mg | ORAL_TABLET | Freq: Every day | ORAL | 0 refills | Status: AC

## 2022-07-20 NOTE — Progress Notes (Signed)
[place]   Under 2 days since treatment started- will order Pred to her previous EV. Advised if symptoms do not improve to follow up in person.  Message sent to detail this.

## 2022-07-20 NOTE — Addendum Note (Signed)
Addended by: Perlie Mayo on: 07/20/2022 09:57 AM   Modules accepted: Orders

## 2022-07-23 ENCOUNTER — Ambulatory Visit
Admission: RE | Admit: 2022-07-23 | Discharge: 2022-07-23 | Disposition: A | Discharge status: Home / Self Care | Payer: BC Managed Care – PPO | Source: Ambulatory Visit | Attending: Internal Medicine | Admitting: Internal Medicine

## 2022-07-23 DIAGNOSIS — Z1231 Encounter for screening mammogram for malignant neoplasm of breast: Secondary | ICD-10-CM | POA: Diagnosis present

## 2022-07-26 ENCOUNTER — Ambulatory Visit: Payer: BC Managed Care – PPO | Admitting: Physical Therapy

## 2022-07-26 ENCOUNTER — Encounter: Payer: Self-pay | Admitting: Physical Therapy

## 2022-07-26 DIAGNOSIS — R278 Other lack of coordination: Secondary | ICD-10-CM

## 2022-07-26 DIAGNOSIS — M6281 Muscle weakness (generalized): Secondary | ICD-10-CM

## 2022-07-26 DIAGNOSIS — R293 Abnormal posture: Secondary | ICD-10-CM

## 2022-07-26 NOTE — Therapy (Signed)
OUTPATIENT PHYSICAL THERAPY FEMALE PELVIC TREATMENT   Patient Name: Angela Pacheco MRN: [id number] DOB:1966/06/04, 56 y.o., female Today's Date: 07/26/2022   PT End of Session - 07/26/22 1204     Visit Number 4    Number of Visits 12    Date for PT Re-Evaluation 09/20/22    Authorization Type IE 06/28/2022    PT Start Time 1200    PT Stop Time 1240    PT Time Calculation (min) 40 min    Activity Tolerance Patient tolerated treatment well    Behavior During Therapy [facility] for tasks assessed/performed             Past Medical History:  Diagnosis Date   Allergy    cats trigger   Asthma    mild   B12 deficiency 40/98/1191   Complication of anesthesia    Dysplastic nevus 11/01/2017   Right middle shoulder, mod, excision by Dr Phillip Heal   Dysplastic nevus 07/03/2020   L medial scapular back, lat to T5, moderate, excision by Dr Phillip Heal   Dysplastic nevus 06/09/2020   L scapular back lateral, mild   Hypothyroidism    Thyroid disease    Past Surgical History:  Procedure Laterality Date   ABDOMINAL HYSTERECTOMY     cervix left in place   ANTERIOR CRUCIATE LIGAMENT REPAIR Left 2017   APPENDECTOMY  12/27/1989   CHOLECYSTECTOMY     COLONOSCOPY WITH PROPOFOL N/A 10/08/2019   Procedure: COLONOSCOPY WITH PROPOFOL;  Surgeon: Lin Landsman, MD;  Location: [facility];  Service: Gastroenterology;  Laterality: N/A;   Patient Active Problem List   Diagnosis Date Noted   Abnormal liver function test 06/22/2022   OSA (obstructive sleep apnea) 05/06/2022   Dizziness 04/18/2022   Daytime sleepiness 03/15/2022   Epigastric pain 02/26/2022   Decreased hearing 01/24/2022   Fatigue 01/24/2022   GERD (gastroesophageal reflux disease) 01/24/2022   Status post shoulder surgery 11/24/2021   Stress 10/11/2021   COVID-19 virus infection 07/13/2021   Right foot pain 04/03/2021   UTI (urinary tract infection) 02/14/2021   Impingement syndrome of left shoulder region 09/22/2020    Tingling in extremities 09/14/2020   Axillary fullness 06/18/2020   Sinusitis 03/16/2020   Shoulder pain 01/01/2020   Neck fullness 01/20/2019   Family history of lupus erythematosus 10/04/2018   Positive ANA (antinuclear antibody) 10/04/2018   Joint pain 09/15/2018   Immunity to measles determined by serologic test 06/30/2018   Abnormal mammogram 07/07/2016   Overweight 02/11/2016   Health care maintenance 04/05/2015   Environmental allergies 01/17/2015   Asthma 01/17/2015   Menopausal symptoms 01/17/2015   Endometriosis 01/17/2015   Hypothyroidism 01/14/2015    PCP: [name], MD  REFERRING PROVIDER: Benjaman Kindler, MD   REFERRING DIAG: M62.89 (ICD-10-CM) - Pelvic floor dysfunction   THERAPY DIAG:  Other lack of coordination  Muscle weakness (generalized)  Abnormal posture  Rationale for Evaluation and Treatment Rehabilitation  PRECAUTIONS: None  WEIGHT BEARING RESTRICTIONS No  FALLS:  Has patient fallen in last 6 months? Yes. Number of falls 1; dog pulled down steps; no known unsteadiness/changes to balance  ONSET DATE: 2022  SUBJECTIVE:  Patient notes that she has been doing exercises most days. Patient reports that she has just now started to notice a little improvement. Patient reports she was able to sit through entire movie without needing to get up and go to bathroom, but will still leak with heavy coughs/sneezes.    PERTINENT HISTORY/CHART REVIEW:  Red flags (bowel/bladder changes, saddle paresthesia, personal history of cancer, h/o spinal tumors, h/o compression fx, h/o abdominal aneurysm, abdominal pain, chills/fever, night sweats, nausea, vomiting, unrelenting pain, first onset of insidious LBP <20 y/o): Negative   PAIN:  Are you having pain?  No   OCCUPATION/LEISURE ACTIVITIES:  [employer], walk, yoga, strength training, aquatics, cooking, socializing   PLOF:  Independent  PATIENT GOALS: Improve symptoms   TREATMENT  Pre-treatment assessment:  Manual Therapy:   Neuromuscular Re-education: Reassessed goals; see below.  Patient education on progression of PFM strengthening for HEP.  Therapeutic Exercise:   Treatments unbilled:  Post-treatment assessment:  Patient educated throughout session on appropriate technique and form using multi-modal cueing, HEP, and activity modification. Patient articulated understanding and returned demonstration.  Patient Response to interventions: No concerns    PATIENT SURVEYS:  FOTO Urinary 59, PFDI Urinary 13  ASSESSMENT:  Clinical Impression: Patient presents to clinic with excellent motivation to participate in therapy. Patient demonstrates deficits in PFM coordination, PFM strength, IAP management, bladder behaviors, and posture. Patient with improved PFM coordination and control on testing during today's session and has made considerable progress toward goal achievement. Patient will benefit from continued skilled therapeutic intervention to address remaining deficits in PFM coordination, PFM strength, IAP management, bladder behaviors, and posture in order to increase function and improve overall QOL.    Objective impairments: Abnormal gait, decreased activity tolerance, decreased coordination, decreased endurance, decreased strength, improper body mechanics, and postural dysfunction.   Activity limitations: meal prep, cleaning, interpersonal relationship, shopping, community activity, and occupation.   Personal factors: Behavior pattern, Past/current experiences, Profession, Time since onset of injury/illness/exacerbation, and 3+ comorbidities: asthma, OSA, hypothyroidism, endometriosis, GERD, fatigue, epigastric pain  are also affecting patient's functional outcome.    Rehab Potential: Good  Clinical decision making: Evolving/moderate complexity  Evaluation complexity: Moderate   GOALS: Goals reviewed with patient? Yes  LONG TERM GOALS: Target date: 09/20/2022  Patient will demonstrate independence with HEP in order to maximize therapeutic gains and improve carryover from physical therapy sessions to ADLs in the home and community. Baseline: not initiated; 7/31: IND Goal status: MET  Patient will demonstrate improved function as evidenced by a score of 65 on FOTO measure for full participation in activities at home and in the community.  Baseline: 59; 7/31: 64 Goal status: IN PROGRESS  Patient will demonstrate improved function as evidenced by a score of 0 on PFDI measure for full participation in activities at home and in the community. Baseline: 13; 7/31: 4 Goal status: IN PROGRESS  Patient will demonstrate circumferential and sequential contraction of >3/5 MMT, > 5 sec hold x5 and 5 consecutive quick flicks with </= 10 min rest between testing bouts, and relaxation of the PFM coordinated with breath for improved management of intra-abdominal pressure and normal bowel and bladder function without the presence of pain nor incontinence in order to improve participation at home and in the community. Baseline: not formally assessed; 7/31: 3/5, x3 reps, endurance not tested Goal status: IN PROGRESS   PLAN: Rehab frequency: 1x/week  Rehab duration: 12 weeks  Planned interventions: Therapeutic exercises, Therapeutic activity, Neuromuscular re-education, Balance training, Gait training, Patient/Family education, Joint  mobilization, Orthotic/Fit training, Electrical stimulation, Spinal mobilization, Cryotherapy, Moist heat, scar mobilization, Taping, and Manual therapy    Myles Gip PT, DPT [phone]  07/26/2022, 12:05 PM

## 2022-08-11 LAB — HM PAP SMEAR: HM Pap smear: NORMAL

## 2022-10-25 ENCOUNTER — Encounter: Payer: Self-pay | Admitting: Internal Medicine

## 2022-10-25 ENCOUNTER — Ambulatory Visit: Payer: BC Managed Care – PPO | Admitting: Internal Medicine

## 2022-10-25 VITALS — BP 124/80 | HR 71 | Temp 97.9°F | Resp 16 | Ht 60.0 in | Wt 138.8 lb

## 2022-10-25 DIAGNOSIS — E039 Hypothyroidism, unspecified: Secondary | ICD-10-CM | POA: Diagnosis not present

## 2022-10-25 DIAGNOSIS — K219 Gastro-esophageal reflux disease without esophagitis: Secondary | ICD-10-CM

## 2022-10-25 DIAGNOSIS — R7989 Other specified abnormal findings of blood chemistry: Secondary | ICD-10-CM | POA: Diagnosis not present

## 2022-10-25 DIAGNOSIS — J4520 Mild intermittent asthma, uncomplicated: Secondary | ICD-10-CM | POA: Diagnosis not present

## 2022-10-25 DIAGNOSIS — N3281 Overactive bladder: Secondary | ICD-10-CM

## 2022-10-25 DIAGNOSIS — R5383 Other fatigue: Secondary | ICD-10-CM

## 2022-10-25 DIAGNOSIS — G4733 Obstructive sleep apnea (adult) (pediatric): Secondary | ICD-10-CM

## 2022-10-25 DIAGNOSIS — F439 Reaction to severe stress, unspecified: Secondary | ICD-10-CM

## 2022-10-25 LAB — CBC WITH DIFFERENTIAL/PLATELET
Basophils Absolute: 0.1 K/uL (ref 0.0–0.1)
Basophils Relative: 0.7 % (ref 0.0–3.0)
Eosinophils Absolute: 0.3 K/uL (ref 0.0–0.7)
Eosinophils Relative: 3.9 % (ref 0.0–5.0)
HCT: 42.1 % (ref 36.0–46.0)
Hemoglobin: 14.4 g/dL (ref 12.0–15.0)
Lymphocytes Relative: 33 % (ref 12.0–46.0)
Lymphs Abs: 2.6 K/uL (ref 0.7–4.0)
MCHC: 34.2 g/dL (ref 30.0–36.0)
MCV: 91.6 fl (ref 78.0–100.0)
Monocytes Absolute: 0.5 K/uL (ref 0.1–1.0)
Monocytes Relative: 6.9 % (ref 3.0–12.0)
Neutro Abs: 4.3 K/uL (ref 1.4–7.7)
Neutrophils Relative %: 55.5 % (ref 43.0–77.0)
Platelets: 320 K/uL (ref 150.0–400.0)
RBC: 4.6 Mil/uL (ref 3.87–5.11)
RDW: 12.9 % (ref 11.5–15.5)
WBC: 7.8 K/uL (ref 4.0–10.5)

## 2022-10-25 LAB — TSH: TSH: 0.72 u[IU]/mL (ref 0.35–5.50)

## 2022-10-25 LAB — COMPREHENSIVE METABOLIC PANEL
ALT: 35 U/L (ref 0–35)
AST: 23 U/L (ref 0–37)
Albumin: 4.3 g/dL (ref 3.5–5.2)
Alkaline Phosphatase: 74 U/L (ref 39–117)
BUN: 15 mg/dL (ref 6–23)
CO2: 23 mEq/L (ref 19–32)
Calcium: 9.6 mg/dL (ref 8.4–10.5)
Chloride: 107 mEq/L (ref 96–112)
Creatinine, Ser: 0.83 mg/dL (ref 0.40–1.20)
GFR: 79.81 mL/min (ref >60.00)
Glucose, Bld: 92 mg/dL (ref 70–99)
Potassium: 4 mEq/L (ref 3.5–5.1)
Sodium: 140 mEq/L (ref 135–145)
Total Bilirubin: 0.4 mg/dL (ref 0.2–1.2)
Total Protein: 6.6 g/dL (ref 6.0–8.3)

## 2022-10-25 MED ORDER — PANTOPRAZOLE SODIUM 40 MG PO TBEC: 40.0000 mg | DELAYED_RELEASE_TABLET | Freq: Every day | ORAL | 0 refills | Status: DC

## 2022-10-25 MED ORDER — MIRABEGRON ER 25 MG PO TB24: 25.0000 mg | ORAL_TABLET | Freq: Every day | ORAL | 2 refills | Status: DC

## 2022-10-25 NOTE — Progress Notes (Signed)
Patient ID: Angela Pacheco, female   DOB: 1966-10-15, 56 y.o.   MRN: [id number]   Subjective:    Patient ID: Angela Pacheco, female    DOB: [birth year], 56 y.o.   MRN: [id number]   Patient here for follow up.  Chief Complaint  Patient presents with   Gastroesophageal Reflux   .   HPI Here to follow up regarding GERD, increased stress, OSA and asthma.  Saw gyn 08/11/22 - PAP - negative with negative HPV. Refilled estradiol.  Did go to pelvic floor PT.  Helped, but she is still having issues with overactive bladder.  Would like to try medication. Saw GI - f/u 09/28/22.  Has weaned down to PPI prn.  Occasional carafate.  States since  she stopped taking regularly, she has noticed increased acid reflux and some constipation.  May have a flare with acid reflux 1-2x/week.  Last bowel movement yesterday.  Breathing stable.  Handling stress.  School stress is some better.     Past Medical History:  Diagnosis Date   Allergy    cats trigger   Asthma    mild   B12 deficiency 87/56/4332   Complication of anesthesia    Dysplastic nevus 11/01/2017   Right middle shoulder, mod, excision by Dr Phillip Heal   Dysplastic nevus 07/03/2020   L medial scapular back, lat to T5, moderate, excision by Dr Phillip Heal   Dysplastic nevus 06/09/2020   L scapular back lateral, mild   Hypothyroidism    Thyroid disease    Past Surgical History:  Procedure Laterality Date   ABDOMINAL HYSTERECTOMY     cervix left in place   ANTERIOR CRUCIATE LIGAMENT REPAIR Left 2017   APPENDECTOMY  12/27/1989   CHOLECYSTECTOMY     COLONOSCOPY WITH PROPOFOL N/A 10/08/2019   Procedure: COLONOSCOPY WITH PROPOFOL;  Surgeon: Lin Landsman, MD;  Location: [place];  Service: Gastroenterology;  Laterality: N/A;   Family History  Problem Relation Age of Onset   Arthritis Mother    Breast cancer Neg Hx    Social History   Socioeconomic History   Marital status: Married    Spouse name: Not on file   Number of  children: Not on file   Years of education: Not on file   Highest education level: Not on file  Occupational History   Not on file  Tobacco Use   Smoking status: Never   Smokeless tobacco: Never  Substance and Sexual Activity   Alcohol use: Yes    Alcohol/week: 0.0 standard drinks of alcohol    Comment: occ.   Drug use: No   Sexual activity: Not on file  Other Topics Concern   Not on file  Social History Narrative   Not on file   Social Determinants of Health   Financial Resource Strain: Not on file  Food Insecurity: Not on file  Transportation Needs: Not on file  Physical Activity: Not on file  Stress: Not on file  Social Connections: Not on file     Review of Systems  Constitutional:  Negative for appetite change and unexpected weight change.  HENT:  Negative for congestion and sinus pressure.   Respiratory:  Negative for cough, chest tightness and shortness of breath.   Cardiovascular:  Negative for chest pain, palpitations and leg swelling.  Gastrointestinal:  Positive for constipation. Negative for abdominal pain, diarrhea, nausea and vomiting.       Acid reflux as outlined.   Genitourinary:  Negative for difficulty urinating and dysuria.  Musculoskeletal:  Negative for joint swelling and myalgias.  Skin:  Negative for color change and rash.  Neurological:  Negative for dizziness, light-headedness and headaches.  Psychiatric/Behavioral:  Negative for agitation and dysphoric mood.        Objective:     BP 124/80 (BP Location: Left Arm, Patient Position: Sitting, Cuff Size: Small)   Pulse 71   Temp 97.9 F (36.6 C) (Temporal)   Resp 16   Ht 5' (1.524 m)   Wt 138 lb 12.8 oz (63 kg)   SpO2 97%   BMI 27.11 kg/m  Wt Readings from Last 3 Encounters:  10/25/22 138 lb 12.8 oz (63 kg)  06/22/22 131 lb 3.2 oz (59.5 kg)  04/23/22 129 lb (58.5 kg)    Physical Exam Vitals reviewed.  Constitutional:      General: She is not in acute distress.    Appearance:  Normal appearance.  HENT:     Head: Normocephalic and atraumatic.     Right Ear: External ear normal.     Left Ear: External ear normal.  Eyes:     General: No scleral icterus.       Right eye: No discharge.        Left eye: No discharge.     Conjunctiva/sclera: Conjunctivae normal.  Neck:     Thyroid: No thyromegaly.  Cardiovascular:     Rate and Rhythm: Normal rate and regular rhythm.  Pulmonary:     Effort: No respiratory distress.     Breath sounds: Normal breath sounds. No wheezing.  Abdominal:     General: Bowel sounds are normal.     Palpations: Abdomen is soft.     Tenderness: There is no abdominal tenderness.  Musculoskeletal:        General: No swelling or tenderness.     Cervical back: Neck supple. No tenderness.  Lymphadenopathy:     Cervical: No cervical adenopathy.  Skin:    Findings: No erythema or rash.  Neurological:     Mental Status: She is alert.  Psychiatric:        Mood and Affect: Mood normal.        Behavior: Behavior normal.      Outpatient Encounter Medications as of 10/25/2022  Medication Sig   albuterol (VENTOLIN HFA) 108 (90 Base) MCG/ACT inhaler INHALE 2 PUFFS INTO THE LUNGS EVERY 6 HOURS AS NEEDED FOR WHEEZING OR SHORTNESS OF BREATH   Calcium Carbonate-Vitamin D (CALTRATE 600+D PO) Take 1 tablet by mouth 2 (two) times daily.   cyanocobalamin (,VITAMIN B-12,) 1000 MCG/ML injection INJECT 1 ML IN THE MUSCLE EVERY 30 DAYS   levothyroxine (SYNTHROID) 100 MCG tablet TAKE 1 TABLET(100 MCG) BY MOUTH DAILY   Magnesium 400 MG CAPS Take 1 tablet by mouth daily at 6 (six) AM.   mirabegron ER (MYRBETRIQ) 25 MG TB24 tablet Take 1 tablet (25 mg total) by mouth daily.   SYRINGE-NEEDLE, DISP, 3 ML (BD INTEGRA SYRINGE) 25G X 1" 3 ML MISC Use as directed with B12 injections   Triamcinolone Acetonide (NASACORT AQ NA) Place into the nose.   vitamin C (ASCORBIC ACID) 500 MG tablet Take 500 mg by mouth daily.   Zinc Sulfate (ZINC-220 PO) Take 1 tablet by mouth  daily at 6 (six) AM.   pantoprazole (PROTONIX) 40 MG tablet Take 1 tablet (40 mg total) by mouth daily.   [DISCONTINUED] pantoprazole (PROTONIX) 40 MG tablet Take 1 tablet (40 mg total) by mouth 2 (two) times daily before a meal. (  Patient not taking: Reported on 10/25/2022)   [DISCONTINUED] sucralfate (CARAFATE) 1 g tablet Take 1 g by mouth 2 (two) times daily. (Patient not taking: Reported on 10/25/2022)   No facility-administered encounter medications on file as of 10/25/2022.     Lab Results  Component Value Date   WBC 7.8 10/25/2022   HGB 14.4 10/25/2022   HCT 42.1 10/25/2022   PLT 320.0 10/25/2022   GLUCOSE 92 10/25/2022   CHOL 174 03/17/2022   TRIG 88.0 03/17/2022   HDL 51.00 03/17/2022   LDLCALC 105 (H) 03/17/2022   ALT 35 10/25/2022   AST 23 10/25/2022   NA 140 10/25/2022   K 4.0 10/25/2022   CL 107 10/25/2022   CREATININE 0.83 10/25/2022   BUN 15 10/25/2022   CO2 23 10/25/2022   TSH 0.72 10/25/2022    MM 3D SCREEN BREAST BILATERAL  Result Date: 07/26/2022 CLINICAL DATA:  Screening. EXAM: DIGITAL SCREENING BILATERAL MAMMOGRAM WITH TOMOSYNTHESIS AND CAD TECHNIQUE: Bilateral screening digital craniocaudal and mediolateral oblique mammograms were obtained. Bilateral screening digital breast tomosynthesis was performed. The images were evaluated with computer-aided detection. COMPARISON:  Previous exam(s). ACR Breast Density Category b: There are scattered areas of fibroglandular density. FINDINGS: There are no findings suspicious for malignancy. IMPRESSION: No mammographic evidence of malignancy. A result letter of this screening mammogram will be mailed directly to the patient. RECOMMENDATION: Screening mammogram in one year. (Code:SM-B-01Y) BI-RADS CATEGORY  1: Negative. Electronically Signed   By: Abelardo Diesel M.D.   On: 07/26/2022 09:39       Assessment & Plan:   Problem List Items Addressed This Visit     Abnormal liver function test    Recheck liver panel to  confirm wnl.       Asthma    Breathing stable.       Fatigue    Some fatigue.  Check routine labs.       GERD (gastroesophageal reflux disease)    Has stopped taking PPI regularly.  With increased acid flares as outlined.  Restart PPI.  Follow.       Relevant Medications   pantoprazole (PROTONIX) 40 MG tablet   Hypothyroidism - Primary    On thyroid replacement.  Follow tsh.       Relevant Orders   CBC w/Diff (Completed)   Comp Met (CMET) (Completed)   TSH (Completed)   OSA (obstructive sleep apnea)    Has seen ENT      Overactive bladder    Went to pelvic floor therapy.  Helped.  Still with issues. Would like to try medication.  Trial of myrbetriq.        Stress    Increased stress as outlined.  Has been seeing a therapist.  Does not feel needs any further intervention at this time.  Follow.         [name], MD

## 2022-10-31 ENCOUNTER — Encounter: Payer: Self-pay | Admitting: Internal Medicine

## 2022-10-31 DIAGNOSIS — N3281 Overactive bladder: Secondary | ICD-10-CM | POA: Insufficient documentation

## 2022-10-31 NOTE — Assessment & Plan Note (Signed)
Breathing stable.

## 2022-10-31 NOTE — Assessment & Plan Note (Signed)
On thyroid replacement.  Follow tsh.  

## 2022-10-31 NOTE — Assessment & Plan Note (Signed)
Went to pelvic floor therapy.  Helped.  Still with issues. Would like to try medication.  Trial of myrbetriq.

## 2022-10-31 NOTE — Assessment & Plan Note (Signed)
Recheck liver panel to confirm wnl.

## 2022-10-31 NOTE — Assessment & Plan Note (Addendum)
Has stopped taking PPI regularly.  With increased acid flares as outlined.  Restart PPI.  Follow.

## 2022-10-31 NOTE — Assessment & Plan Note (Signed)
Some fatigue.  Check routine labs.

## 2022-10-31 NOTE — Assessment & Plan Note (Signed)
Has seen ENT

## 2022-10-31 NOTE — Assessment & Plan Note (Addendum)
Increased stress as outlined.  Has been seeing a therapist.  Does not feel needs any further intervention at this time.  Follow.

## 2022-11-08 ENCOUNTER — Telehealth: Payer: BC Managed Care – PPO | Admitting: Physician Assistant

## 2022-11-08 DIAGNOSIS — B9689 Other specified bacterial agents as the cause of diseases classified elsewhere: Secondary | ICD-10-CM

## 2022-11-08 DIAGNOSIS — J019 Acute sinusitis, unspecified: Secondary | ICD-10-CM | POA: Diagnosis not present

## 2022-11-08 MED ORDER — AMOXICILLIN-POT CLAVULANATE 875-125 MG PO TABS: 1.0000 | ORAL_TABLET | Freq: Two times a day (BID) | ORAL | 0 refills | Status: DC

## 2022-11-08 NOTE — Progress Notes (Signed)

## 2022-11-15 ENCOUNTER — Encounter: Payer: Self-pay | Admitting: Internal Medicine

## 2023-01-16 ENCOUNTER — Other Ambulatory Visit: Payer: Self-pay | Admitting: Internal Medicine

## 2023-01-17 MED ORDER — "BD INTEGRA SYRINGE 25G X 1"" 3 ML MISC": 0 refills | Status: DC

## 2023-01-17 MED ORDER — CYANOCOBALAMIN 1000 MCG/ML IJ SOLN: INTRAMUSCULAR | 0 refills | Status: DC

## 2023-01-25 ENCOUNTER — Encounter: Payer: Self-pay | Admitting: Internal Medicine

## 2023-01-26 ENCOUNTER — Ambulatory Visit: Payer: BC Managed Care – PPO | Admitting: Internal Medicine

## 2023-02-28 ENCOUNTER — Ambulatory Visit: Payer: BC Managed Care – PPO | Admitting: Internal Medicine

## 2023-02-28 VITALS — BP 118/70 | HR 84 | Temp 98.2°F | Resp 16 | Ht 61.0 in | Wt 141.0 lb

## 2023-02-28 DIAGNOSIS — Z9109 Other allergy status, other than to drugs and biological substances: Secondary | ICD-10-CM

## 2023-02-28 DIAGNOSIS — J4520 Mild intermittent asthma, uncomplicated: Secondary | ICD-10-CM

## 2023-02-28 DIAGNOSIS — R7989 Other specified abnormal findings of blood chemistry: Secondary | ICD-10-CM | POA: Diagnosis not present

## 2023-02-28 DIAGNOSIS — E039 Hypothyroidism, unspecified: Secondary | ICD-10-CM | POA: Diagnosis not present

## 2023-02-28 DIAGNOSIS — F439 Reaction to severe stress, unspecified: Secondary | ICD-10-CM

## 2023-02-28 DIAGNOSIS — Z136 Encounter for screening for cardiovascular disorders: Secondary | ICD-10-CM

## 2023-02-28 DIAGNOSIS — N3281 Overactive bladder: Secondary | ICD-10-CM

## 2023-02-28 DIAGNOSIS — G4733 Obstructive sleep apnea (adult) (pediatric): Secondary | ICD-10-CM

## 2023-02-28 DIAGNOSIS — K219 Gastro-esophageal reflux disease without esophagitis: Secondary | ICD-10-CM

## 2023-02-28 NOTE — Progress Notes (Signed)
Subjective:    Patient ID: Angela Pacheco, female    DOB: 04/23/1966, 57 y.o.   MRN: [id number]  Patient here for  Chief Complaint  Patient presents with   Medical Management of Chronic Issues    HPI Here to follow up regarding GERD, increased stress, OSA and asthma.  Saw gyn 08/11/22 - PAP - negative with negative HPV. Refilled estradiol.  Did go to pelvic floor PT.  Helped.  Allergy symptoms - discussed - zyrtec, flonase, salline nasal spray.  Using oral appliance - sleep - working.  Tries to stay active.  No chest pain or sob reported.  Protonix - q am - most days.  No abdominal pain reported.  Stress - work - school.  Overall appears to be handling things relatively well.  Discussed CT calcium score.    Past Medical History:  Diagnosis Date   Allergy    cats trigger   Asthma    mild   B12 deficiency 99991111   Complication of anesthesia    Dysplastic nevus 11/01/2017   Right middle shoulder, mod, excision by Dr Phillip Heal   Dysplastic nevus 07/03/2020   L medial scapular back, lat to T5, moderate, excision by Dr Phillip Heal   Dysplastic nevus 06/09/2020   L scapular back lateral, mild   Hypothyroidism    Thyroid disease    Past Surgical History:  Procedure Laterality Date   ABDOMINAL HYSTERECTOMY     cervix left in place   ANTERIOR CRUCIATE LIGAMENT REPAIR Left 2017   APPENDECTOMY  12/27/1989   CHOLECYSTECTOMY     COLONOSCOPY WITH PROPOFOL N/A 10/08/2019   Procedure: COLONOSCOPY WITH PROPOFOL;  Surgeon: Lin Landsman, MD;  Location: [facility];  Service: Gastroenterology;  Laterality: N/A;   Family History  Problem Relation Age of Onset   Arthritis Mother    Breast cancer Neg Hx    Social History   Socioeconomic History   Marital status: Married    Spouse name: Not on file   Number of children: Not on file   Years of education: Not on file   Highest education level: Not on file  Occupational History   Not on file  Tobacco Use   Smoking status:  Never   Smokeless tobacco: Never  Substance and Sexual Activity   Alcohol use: Yes    Alcohol/week: 0.0 standard drinks of alcohol    Comment: occ.   Drug use: No   Sexual activity: Not on file  Other Topics Concern   Not on file  Social History Narrative   Not on file   Social Determinants of Health   Financial Resource Strain: Not on file  Food Insecurity: Not on file  Transportation Needs: Not on file  Physical Activity: Not on file  Stress: Not on file  Social Connections: Not on file     Review of Systems  Constitutional:  Negative for appetite change and unexpected weight change.  HENT:  Negative for sinus pressure.        Allergy symptoms.   Respiratory:  Negative for cough, chest tightness and shortness of breath.   Cardiovascular:  Negative for chest pain and palpitations.  Gastrointestinal:  Negative for abdominal pain, diarrhea, nausea and vomiting.  Genitourinary:  Negative for difficulty urinating and dysuria.  Musculoskeletal:  Negative for joint swelling and myalgias.  Skin:  Negative for color change and rash.  Neurological:  Negative for dizziness and headaches.  Psychiatric/Behavioral:  Negative for agitation and dysphoric mood.  Objective:     BP 118/70   Pulse 84   Temp 98.2 F (36.8 C)   Resp 16   Ht '5\' 1"'$  (1.549 m)   Wt 141 lb (64 kg)   SpO2 98%   BMI 26.64 kg/m  Wt Readings from Last 3 Encounters:  02/28/23 141 lb (64 kg)  10/25/22 138 lb 12.8 oz (63 kg)  06/22/22 131 lb 3.2 oz (59.5 kg)    Physical Exam Vitals reviewed.  Constitutional:      General: She is not in acute distress.    Appearance: Normal appearance.  HENT:     Head: Normocephalic and atraumatic.     Right Ear: External ear normal.     Left Ear: External ear normal.  Eyes:     General: No scleral icterus.       Right eye: No discharge.        Left eye: No discharge.     Conjunctiva/sclera: Conjunctivae normal.  Neck:     Thyroid: No thyromegaly.   Cardiovascular:     Rate and Rhythm: Normal rate and regular rhythm.  Pulmonary:     Effort: No respiratory distress.     Breath sounds: Normal breath sounds. No wheezing.  Abdominal:     General: Bowel sounds are normal.     Palpations: Abdomen is soft.     Tenderness: There is no abdominal tenderness.  Musculoskeletal:        General: No swelling or tenderness.     Cervical back: Neck supple. No tenderness.  Lymphadenopathy:     Cervical: No cervical adenopathy.  Skin:    Findings: No erythema or rash.  Neurological:     Mental Status: She is alert.  Psychiatric:        Mood and Affect: Mood normal.        Behavior: Behavior normal.      Outpatient Encounter Medications as of 02/28/2023  Medication Sig   albuterol (VENTOLIN HFA) 108 (90 Base) MCG/ACT inhaler INHALE 2 PUFFS INTO THE LUNGS EVERY 6 HOURS AS NEEDED FOR WHEEZING OR SHORTNESS OF BREATH   Calcium Carbonate-Vitamin D (CALTRATE 600+D PO) Take 1 tablet by mouth 2 (two) times daily.   cyanocobalamin (VITAMIN B12) 1000 MCG/ML injection INJECT 1 ML IN THE MUSCLE EVERY 30 DAYS   levothyroxine (SYNTHROID) 100 MCG tablet TAKE 1 TABLET(100 MCG) BY MOUTH DAILY   Magnesium 400 MG CAPS Take 1 tablet by mouth daily at 6 (six) AM.   pantoprazole (PROTONIX) 40 MG tablet Take 1 tablet (40 mg total) by mouth daily.   SYRINGE-NEEDLE, DISP, 3 ML (BD INTEGRA SYRINGE) 25G X 1" 3 ML MISC Use as directed with B12 injections   Triamcinolone Acetonide (NASACORT AQ NA) Place into the nose.   vitamin C (ASCORBIC ACID) 500 MG tablet Take 500 mg by mouth daily.   Zinc Sulfate (ZINC-220 PO) Take 1 tablet by mouth daily at 6 (six) AM.   [DISCONTINUED] amoxicillin-clavulanate (AUGMENTIN) 875-125 MG tablet Take 1 tablet by mouth 2 (two) times daily.   [DISCONTINUED] mirabegron ER (MYRBETRIQ) 25 MG TB24 tablet Take 1 tablet (25 mg total) by mouth daily.   No facility-administered encounter medications on file as of 02/28/2023.     Lab Results   Component Value Date   WBC 7.8 10/25/2022   HGB 14.4 10/25/2022   HCT 42.1 10/25/2022   PLT 320.0 10/25/2022   GLUCOSE 92 10/25/2022   CHOL 174 03/17/2022   TRIG 88.0 03/17/2022   HDL  51.00 03/17/2022   LDLCALC 105 (H) 03/17/2022   ALT 35 10/25/2022   AST 23 10/25/2022   NA 140 10/25/2022   K 4.0 10/25/2022   CL 107 10/25/2022   CREATININE 0.83 10/25/2022   BUN 15 10/25/2022   CO2 23 10/25/2022   TSH 0.72 10/25/2022    MM 3D SCREEN BREAST BILATERAL  Result Date: 07/26/2022 CLINICAL DATA:  Screening. EXAM: DIGITAL SCREENING BILATERAL MAMMOGRAM WITH TOMOSYNTHESIS AND CAD TECHNIQUE: Bilateral screening digital craniocaudal and mediolateral oblique mammograms were obtained. Bilateral screening digital breast tomosynthesis was performed. The images were evaluated with computer-aided detection. COMPARISON:  Previous exam(s). ACR Breast Density Category b: There are scattered areas of fibroglandular density. FINDINGS: There are no findings suspicious for malignancy. IMPRESSION: No mammographic evidence of malignancy. A result letter of this screening mammogram will be mailed directly to the patient. RECOMMENDATION: Screening mammogram in one year. (Code:SM-B-01Y) BI-RADS CATEGORY  1: Negative. Electronically Signed   By: Abelardo Diesel M.D.   On: 07/26/2022 09:39       Assessment & Plan:  Encounter for screening for coronary artery disease -     CT CARDIAC SCORING (SELF PAY ONLY); Future  Hypothyroidism, unspecified type Assessment & Plan: On thyroid replacement.  Follow tsh.    Abnormal liver function test Assessment & Plan: Follow liver function tests to confirm wnl.    Mild intermittent asthma without complication Assessment & Plan: Breathing stable.    Environmental allergies Assessment & Plan: Zyrtec, flonase, saline nasal spray.    Gastroesophageal reflux disease, unspecified whether esophagitis present Assessment & Plan: Protonix.  Follow.    OSA (obstructive  sleep apnea) Assessment & Plan: Has seen ENT. Oral appliance.  Continue.  Follow.    Overactive bladder Assessment & Plan: Went to pelvic floor therapy.  Helped.     Stress Assessment & Plan: Increased stress as outlined.  Has seen a therapist.  Does not feel needs any further intervention at this time.  Follow.       [name], MD

## 2023-03-01 ENCOUNTER — Telehealth: Payer: Self-pay | Admitting: Internal Medicine

## 2023-03-01 NOTE — Telephone Encounter (Signed)
Lft pt vm to call ofc . thanks 

## 2023-03-05 ENCOUNTER — Encounter: Payer: Self-pay | Admitting: Internal Medicine

## 2023-03-05 NOTE — Assessment & Plan Note (Signed)
Has seen ENT. Oral appliance.  Continue.  Follow.

## 2023-03-05 NOTE — Assessment & Plan Note (Signed)
Breathing stable.

## 2023-03-05 NOTE — Assessment & Plan Note (Addendum)
Went to pelvic floor therapy.  Helped.

## 2023-03-05 NOTE — Assessment & Plan Note (Signed)
Follow liver function tests to confirm wnl.

## 2023-03-05 NOTE — Assessment & Plan Note (Signed)
On thyroid replacement.  Follow tsh.  

## 2023-03-05 NOTE — Assessment & Plan Note (Signed)
Increased stress as outlined.  Has seen a therapist.  Does not feel needs any further intervention at this time.  Follow.

## 2023-03-05 NOTE — Assessment & Plan Note (Signed)
Zyrtec, flonase, saline nasal spray.

## 2023-03-05 NOTE — Assessment & Plan Note (Signed)
Protonix.  Follow.

## 2023-03-08 ENCOUNTER — Ambulatory Visit
Admission: RE | Admit: 2023-03-08 | Discharge: 2023-03-08 | Disposition: A | Discharge status: Home / Self Care | Payer: BC Managed Care – PPO | Source: Ambulatory Visit | Attending: Internal Medicine | Admitting: Internal Medicine

## 2023-03-08 DIAGNOSIS — Z136 Encounter for screening for cardiovascular disorders: Secondary | ICD-10-CM | POA: Insufficient documentation

## 2023-03-13 ENCOUNTER — Encounter: Payer: Self-pay | Admitting: Internal Medicine

## 2023-03-14 ENCOUNTER — Other Ambulatory Visit: Payer: Self-pay

## 2023-03-14 DIAGNOSIS — Z13220 Encounter for screening for lipoid disorders: Secondary | ICD-10-CM

## 2023-03-14 DIAGNOSIS — E039 Hypothyroidism, unspecified: Secondary | ICD-10-CM

## 2023-03-14 NOTE — Telephone Encounter (Signed)
See result note.  

## 2023-03-27 ENCOUNTER — Telehealth: Payer: BC Managed Care – PPO | Admitting: Nurse Practitioner

## 2023-03-27 DIAGNOSIS — J019 Acute sinusitis, unspecified: Secondary | ICD-10-CM | POA: Diagnosis not present

## 2023-03-27 DIAGNOSIS — B9689 Other specified bacterial agents as the cause of diseases classified elsewhere: Secondary | ICD-10-CM

## 2023-03-27 MED ORDER — AMOXICILLIN-POT CLAVULANATE 875-125 MG PO TABS: 1.0000 | ORAL_TABLET | Freq: Two times a day (BID) | ORAL | 0 refills | Status: AC

## 2023-03-27 NOTE — Progress Notes (Signed)
I have spent 5 minutes in review of e-visit questionnaire, review and updating patient chart, medical decision making and response to patient.  ° °Yeng Frankie W Victoire Deans, NP ° °  °

## 2023-03-27 NOTE — Progress Notes (Signed)

## 2023-03-28 ENCOUNTER — Other Ambulatory Visit: Payer: BC Managed Care – PPO

## 2023-03-29 ENCOUNTER — Encounter: Payer: Self-pay | Admitting: Adult Health

## 2023-03-29 ENCOUNTER — Encounter: Payer: Self-pay | Admitting: Internal Medicine

## 2023-03-29 ENCOUNTER — Telehealth: Payer: BC Managed Care – PPO | Admitting: Adult Health

## 2023-03-29 ENCOUNTER — Ambulatory Visit: Payer: BC Managed Care – PPO | Admitting: Nurse Practitioner

## 2023-03-29 DIAGNOSIS — B9689 Other specified bacterial agents as the cause of diseases classified elsewhere: Secondary | ICD-10-CM | POA: Diagnosis not present

## 2023-03-29 DIAGNOSIS — J4520 Mild intermittent asthma, uncomplicated: Secondary | ICD-10-CM

## 2023-03-29 DIAGNOSIS — J019 Acute sinusitis, unspecified: Secondary | ICD-10-CM

## 2023-03-29 MED ORDER — PREDNISONE 10 MG PO TABS: ORAL_TABLET | ORAL | 0 refills | Status: DC

## 2023-03-29 NOTE — Telephone Encounter (Signed)
Pt called in regarding previous message. I offered an appt with W/ [name] for today @ 2:40pm.

## 2023-03-29 NOTE — Progress Notes (Signed)
Virtual Visit via Video Note  I connected with Angela Pacheco on 03/29/23 at  5:00 PM EDT by a video enabled telemedicine application and verified that I am speaking with the correct person using two identifiers.  Location patient: home Location provider:work or home office Persons participating in the virtual visit: patient, provider  I discussed the limitations of evaluation and management by telemedicine and the availability of in person appointments. The patient expressed understanding and agreed to proceed.   HPI: 57 year old female who is being evaluated today for an acute issue.  Her symptoms started 5 days ago with a mild cough, the next day the cough increased but she did not have any other symptoms.  She thought it may be due to allergies due to the pollen count being so hot.  2 days later she developed pretty significant sinus pain and pressure on the right side of her face; we did an e-visit and most started on Augmentin.  He also did a COVID test this day and it was negative.  The following day after starting Augmentin she developed a low-grade fever up to 99 and continued to have a cough.  Today is the worst that she has felt, she continues to have a fever, chest congestion, sinus pressure,  some mild shortness of breath and feels as though it is painful to take a deep breath.  She has been monitoring her pulse ox at home with readings mostly in the 97-98 range, pulse was 89 pressure 111/77.  She is using her albuterol inhaler as needed due to history of asthma and feels as though this helps with her symptoms to some degree.  She has been taking her Augmentin for 2-1/2 days.   ROS: See pertinent positives and negatives per HPI.  Past Medical History:  Diagnosis Date   Allergy    cats trigger   Asthma    mild   B12 deficiency 99991111   Complication of anesthesia    Dysplastic nevus 11/01/2017   Right middle shoulder, mod, excision by Dr Phillip Heal   Dysplastic nevus 07/03/2020    L medial scapular back, lat to T5, moderate, excision by Dr Phillip Heal   Dysplastic nevus 06/09/2020   L scapular back lateral, mild   Hypothyroidism    Thyroid disease     Past Surgical History:  Procedure Laterality Date   ABDOMINAL HYSTERECTOMY     cervix left in place   ANTERIOR CRUCIATE LIGAMENT REPAIR Left 2017   APPENDECTOMY  12/27/1989   CHOLECYSTECTOMY     COLONOSCOPY WITH PROPOFOL N/A 10/08/2019   Procedure: COLONOSCOPY WITH PROPOFOL;  Surgeon: Lin Landsman, MD;  Location: [facility];  Service: Gastroenterology;  Laterality: N/A;    Family History  Problem Relation Age of Onset   Arthritis Mother    Breast cancer Neg Hx        Current Outpatient Medications:    predniSONE (DELTASONE) 10 MG tablet, 40 mg x 3 days, 20 mg x 3 days, 10 mg x 3 days, Disp: 21 tablet, Rfl: 0   albuterol (VENTOLIN HFA) 108 (90 Base) MCG/ACT inhaler, INHALE 2 PUFFS INTO THE LUNGS EVERY 6 HOURS AS NEEDED FOR WHEEZING OR SHORTNESS OF BREATH, Disp: 18 g, Rfl: 1   amoxicillin-clavulanate (AUGMENTIN) 875-125 MG tablet, Take 1 tablet by mouth 2 (two) times daily for 7 days., Disp: 14 tablet, Rfl: 0   Calcium Carbonate-Vitamin D (CALTRATE 600+D PO), Take 1 tablet by mouth 2 (two) times daily., Disp: , Rfl:  cyanocobalamin (VITAMIN B12) 1000 MCG/ML injection, INJECT 1 ML IN THE MUSCLE EVERY 30 DAYS, Disp: 10 mL, Rfl: 0   levothyroxine (SYNTHROID) 100 MCG tablet, TAKE 1 TABLET(100 MCG) BY MOUTH DAILY, Disp: 30 tablet, Rfl: 5   Magnesium 400 MG CAPS, Take 1 tablet by mouth daily at 6 (six) AM., Disp: , Rfl:    pantoprazole (PROTONIX) 40 MG tablet, Take 1 tablet (40 mg total) by mouth daily., Disp: 30 tablet, Rfl: 0   SYRINGE-NEEDLE, DISP, 3 ML (BD INTEGRA SYRINGE) 25G X 1" 3 ML MISC, Use as directed with B12 injections, Disp: 100 each, Rfl: 0   Triamcinolone Acetonide (NASACORT AQ NA), Place into the nose., Disp: , Rfl:    vitamin C (ASCORBIC ACID) 500 MG tablet, Take 500 mg by mouth daily.,  Disp: , Rfl:    Zinc Sulfate (ZINC-220 PO), Take 1 tablet by mouth daily at 6 (six) AM., Disp: , Rfl:   EXAM:  VITALS per patient if applicable:  GENERAL: alert, oriented, appears well and in no acute distress  HEENT: atraumatic, conjunttiva clear, no obvious abnormalities on inspection of external nose and ears  NECK: normal movements of the head and neck  LUNGS: on inspection no signs of respiratory distress, breathing rate appears normal, no obvious gross SOB, gasping or wheezing  CV: no obvious cyanosis  MS: moves all visible extremities without noticeable abnormality  PSYCH/NEURO: pleasant and cooperative, no obvious depression or anxiety, speech and thought processing grossly intact  ASSESSMENT AND PLAN:  Discussed the following assessment and plan:  1. Acute bacterial sinusitis - Encouraged to continue with Augmentin   2. Mild intermittent asthma without complication -Likely having an acute asthma flare on top of her sinus infection.  Will treat with prednisone Dosepak.  We discussed red flags and what to look out for that would warrant a visit to the emergency room.  She should start to feel better next few days but she can follow-up with myself or her PCP if she is not. - predniSONE (DELTASONE) 10 MG tablet; 40 mg x 3 days, 20 mg x 3 days, 10 mg x 3 days  Dispense: 21 tablet; Refill: 0      I discussed the assessment and treatment plan with the patient. The patient was provided an opportunity to ask questions and all were answered. The patient agreed with the plan and demonstrated an understanding of the instructions.   The patient was advised to call back or seek an in-person evaluation if the symptoms worsen or if the condition fails to improve as anticipated.   Dorothyann Peng, NP

## 2023-03-29 NOTE — Telephone Encounter (Signed)
Pt seeing NP today

## 2023-03-29 NOTE — Progress Notes (Deleted)
  Angela Morrow, NP-C Phone: [phone]  Angela Pacheco is a 57 y.o. female who presents today for cough and congestion.   Patient reports symptoms began Thursday. She did an E-Visit on 2 days ago where she was prescribed Augmentin. She woke up today with a low grade fever and worsening symptoms.   Respiratory illness:  Cough- Yes  Congestion-    Sinus- Yes   Chest- Yes  Post nasal drip- ***  Sore throat- ***  Shortness of breath- ***  Fever- Yes, low grade  Fatigue/Myalgia- *** Headache- *** Nausea/Vomiting- *** Taste disturbance- ***  Smell disturbance- ***  Covid exposure- ***  Covid vaccination- ***  Flu vaccination- ***  Medications- Tylenol, saline spray, albuterol inhaler   Social History   Tobacco Use  Smoking Status Never  Smokeless Tobacco Never    Current Outpatient Medications on File Prior to Visit  Medication Sig Dispense Refill   albuterol (VENTOLIN HFA) 108 (90 Base) MCG/ACT inhaler INHALE 2 PUFFS INTO THE LUNGS EVERY 6 HOURS AS NEEDED FOR WHEEZING OR SHORTNESS OF BREATH 18 g 1   amoxicillin-clavulanate (AUGMENTIN) 875-125 MG tablet Take 1 tablet by mouth 2 (two) times daily for 7 days. 14 tablet 0   Calcium Carbonate-Vitamin D (CALTRATE 600+D PO) Take 1 tablet by mouth 2 (two) times daily.     cyanocobalamin (VITAMIN B12) 1000 MCG/ML injection INJECT 1 ML IN THE MUSCLE EVERY 30 DAYS 10 mL 0   levothyroxine (SYNTHROID) 100 MCG tablet TAKE 1 TABLET(100 MCG) BY MOUTH DAILY 30 tablet 5   Magnesium 400 MG CAPS Take 1 tablet by mouth daily at 6 (six) AM.     pantoprazole (PROTONIX) 40 MG tablet Take 1 tablet (40 mg total) by mouth daily. 30 tablet 0   SYRINGE-NEEDLE, DISP, 3 ML (BD INTEGRA SYRINGE) 25G X 1" 3 ML MISC Use as directed with B12 injections 100 each 0   Triamcinolone Acetonide (NASACORT AQ NA) Place into the nose.     vitamin C (ASCORBIC ACID) 500 MG tablet Take 500 mg by mouth daily.     Zinc Sulfate (ZINC-220 PO) Take 1 tablet by mouth  daily at 6 (six) AM.     No current facility-administered medications on file prior to visit.     ROS see history of present illness  Objective  Physical Exam There were no vitals filed for this visit.  BP Readings from Last 3 Encounters:  02/28/23 118/70  10/25/22 124/80  06/22/22 110/72   Wt Readings from Last 3 Encounters:  02/28/23 141 lb (64 kg)  10/25/22 138 lb 12.8 oz (63 kg)  06/22/22 131 lb 3.2 oz (59.5 kg)    Physical Exam   Assessment/Plan: Please see individual problem list.  There are no diagnoses linked to this encounter.   Health Maintenance: ***  No follow-ups on file.   Angela Morrow, NP-C Angela Pacheco

## 2023-03-30 ENCOUNTER — Other Ambulatory Visit: Payer: BC Managed Care – PPO

## 2023-03-30 NOTE — Telephone Encounter (Signed)
This was taking care of. Pt has the prescription.

## 2023-04-05 ENCOUNTER — Other Ambulatory Visit: Payer: Self-pay | Admitting: Internal Medicine

## 2023-04-05 ENCOUNTER — Encounter: Payer: Self-pay | Admitting: Internal Medicine

## 2023-04-05 NOTE — Telephone Encounter (Signed)
Given persistent symptoms, does she want to do f/u virtual visit Thursday 4:00.

## 2023-04-05 NOTE — Telephone Encounter (Signed)
Patient scheduled Thursday at 4;00

## 2023-04-07 ENCOUNTER — Telehealth: Payer: BC Managed Care – PPO | Admitting: Internal Medicine

## 2023-04-07 ENCOUNTER — Encounter: Payer: Self-pay | Admitting: Internal Medicine

## 2023-04-07 VITALS — BP 114/85 | Ht 61.0 in | Wt 138.0 lb

## 2023-04-07 DIAGNOSIS — J329 Chronic sinusitis, unspecified: Secondary | ICD-10-CM

## 2023-04-07 MED ORDER — AMOXICILLIN-POT CLAVULANATE 875-125 MG PO TABS: 1.0000 | ORAL_TABLET | Freq: Two times a day (BID) | ORAL | 0 refills | Status: DC

## 2023-04-07 MED ORDER — PREDNISONE 10 MG PO TABS: ORAL_TABLET | ORAL | 0 refills | Status: DC

## 2023-04-07 MED ORDER — FLUCONAZOLE 150 MG PO TABS
ORAL_TABLET | ORAL | 0 refills | Status: DC
Start: 2023-04-07 — End: 2023-08-01

## 2023-04-07 NOTE — Progress Notes (Signed)
Patient ID: Angela Pacheco, female   DOB: 11/17/1966, 57 y.o.   MRN: [id number]   Virtual Visit via vidoe Note   All issues noted in this document were discussed and addressed.  No physical exam was performed (except for noted visual exam findings with Video Visits).   I connected with Angela Pacheco by a video enabled telemedicine application and verified that I am speaking with the correct person using two identifiers. Location patient: home Location provider: work  Persons participating in the virtual visit: patient, provider  The limitations, risks, security and privacy concerns of performing an evaluation and management service by video and the availability of in person appointments have been discussed.  It has also been discussed with the patient that there may be a patient responsible charge related to this service. The patient expressed understanding and agreed to proceed.   HPI: Work in appt.  Symptoms started 03/24/23 - cough and congestion. Evaluated 03/27/23 - diagnosed with sinus infection.  Treated with augmentin and mucinex.  Covid test negative. Continued to have fever, chest congestion, sinus pressure and mild sob.  Used albuterol inhaler.  F/u virtual visit 03/29/23 - instructed to continue augmentin.  Was also prescribed prednisone dosepak.  Completed augmentin Saturday - 04/02/23.  Completed prednisone 04/06/23.  She feels better, but is still having increased right side sinus pressure and yellow mucus production.  No sore throat.  Some chest congestion and cough.  No nausea or vomiting reported.     ROS: See pertinent positives and negatives per HPI.  Past Medical History:  Diagnosis Date   Allergy    cats trigger   Asthma    mild   B12 deficiency 05/06/2019   Complication of anesthesia    Dysplastic nevus 11/01/2017   Right middle shoulder, mod, excision by Dr Cheree DittoGraham   Dysplastic nevus 07/03/2020   L medial scapular back, lat to T5, moderate, excision by Dr  Cheree DittoGraham   Dysplastic nevus 06/09/2020   L scapular back lateral, mild   Hypothyroidism    Thyroid disease     Past Surgical History:  Procedure Laterality Date   ABDOMINAL HYSTERECTOMY     cervix left in place   ANTERIOR CRUCIATE LIGAMENT REPAIR Left 2017   APPENDECTOMY  12/27/1989   CHOLECYSTECTOMY     COLONOSCOPY WITH PROPOFOL N/A 10/08/2019   Procedure: COLONOSCOPY WITH PROPOFOL;  Surgeon: Toney ReilVanga, Rohini Reddy, MD;  Location: [facility];  Service: Gastroenterology;  Laterality: N/A;    Family History  Problem Relation Age of Onset   Arthritis Mother    Breast cancer Neg Hx     SOCIAL HX: reviewed.    Current Outpatient Medications:    albuterol (VENTOLIN HFA) 108 (90 Base) MCG/ACT inhaler, INHALE 2 PUFFS INTO THE LUNGS EVERY 6 HOURS AS NEEDED FOR WHEEZING OR SHORTNESS OF BREATH, Disp: 18 g, Rfl: 1   amoxicillin-clavulanate (AUGMENTIN) 875-125 MG tablet, Take 1 tablet by mouth 2 (two) times daily., Disp: 14 tablet, Rfl: 0   Calcium Carbonate-Vitamin D (CALTRATE 600+D PO), Take 1 tablet by mouth 2 (two) times daily., Disp: , Rfl:    cyanocobalamin (VITAMIN B12) 1000 MCG/ML injection, INJECT 1 ML IN THE MUSCLE EVERY 30 DAYS, Disp: 10 mL, Rfl: 0   estradiol (ESTRACE) 0.1 MG/GM vaginal cream, Place 1 Applicatorful vaginally once a week., Disp: , Rfl:    fluconazole (DIFLUCAN) 150 MG tablet, Take 1 tablet x 1.  May repeat x 1 if persistent symptoms after three days, Disp: 2 tablet, Rfl:  0   levothyroxine (SYNTHROID) 100 MCG tablet, TAKE 1 TABLET(100 MCG) BY MOUTH DAILY, Disp: 30 tablet, Rfl: 5   Magnesium 400 MG CAPS, Take 1 tablet by mouth daily at 6 (six) AM., Disp: , Rfl:    pantoprazole (PROTONIX) 40 MG tablet, Take 1 tablet (40 mg total) by mouth daily., Disp: 30 tablet, Rfl: 0   predniSONE (DELTASONE) 10 MG tablet, Take 6 tablets x 1 day and then decrease by 1/2 tablet per day until down to zero mg., Disp: 39 tablet, Rfl: 0   sucralfate (CARAFATE) 1 g tablet, Take 1 g by  mouth 2 (two) times daily., Disp: , Rfl:    SYRINGE-NEEDLE, DISP, 3 ML (BD INTEGRA SYRINGE) 25G X 1" 3 ML MISC, Use as directed with B12 injections, Disp: 100 each, Rfl: 0   Triamcinolone Acetonide (NASACORT AQ NA), Place into the nose., Disp: , Rfl:    vitamin C (ASCORBIC ACID) 500 MG tablet, Take 500 mg by mouth daily., Disp: , Rfl:    Zinc Sulfate (ZINC-220 PO), Take 1 tablet by mouth daily at 6 (six) AM., Disp: , Rfl:   EXAM:  GENERAL: alert, oriented, appears well and in no acute distress  HEENT: atraumatic, conjunttiva clear, no obvious abnormalities on inspection of external nose and ears  NECK: normal movements of the head and neck  LUNGS: on inspection no signs of respiratory distress, breathing rate appears normal, no obvious gross SOB, gasping. Some increased cough with forced expiration.   CV: no obvious cyanosis  PSYCH/NEURO: pleasant and cooperative, no obvious depression or anxiety, speech and thought processing grossly intact  ASSESSMENT AND PLAN:  Discussed the following assessment and plan:  Problem List Items Addressed This Visit     Sinusitis - Primary    Persistent symptoms as outlined.  Appear to be c/w sinus infection/URI. She is better.  Discussed possibly - partially treated.  Will extend out augmentin x 1 more week.  Prednisone taper as directed.  Continue albuterol inhaler.  Saline and steroid nasal spray.  Follow.  Call with update.        Relevant Medications   predniSONE (DELTASONE) 10 MG tablet   amoxicillin-clavulanate (AUGMENTIN) 875-125 MG tablet   fluconazole (DIFLUCAN) 150 MG tablet    Return if symptoms worsen or fail to improve.   I discussed the assessment and treatment plan with the patient. The patient was provided an opportunity to ask questions and all were answered. The patient agreed with the plan and demonstrated an understanding of the instructions.   The patient was advised to call back or seek an in-person evaluation if the  symptoms worsen or if the condition fails to improve as anticipated.    Dale Moore, MD

## 2023-04-08 ENCOUNTER — Other Ambulatory Visit (INDEPENDENT_AMBULATORY_CARE_PROVIDER_SITE_OTHER): Payer: BC Managed Care – PPO

## 2023-04-08 DIAGNOSIS — E039 Hypothyroidism, unspecified: Secondary | ICD-10-CM

## 2023-04-08 DIAGNOSIS — Z13220 Encounter for screening for lipoid disorders: Secondary | ICD-10-CM

## 2023-04-08 LAB — HEPATIC FUNCTION PANEL
ALT: 32 U/L (ref 0–35)
AST: 16 U/L (ref 0–37)
Albumin: 4 g/dL (ref 3.5–5.2)
Alkaline Phosphatase: 69 U/L (ref 39–117)
Bilirubin, Direct: 0.1 mg/dL (ref 0.0–0.3)
Total Bilirubin: 0.6 mg/dL (ref 0.2–1.2)
Total Protein: 6.5 g/dL (ref 6.0–8.3)

## 2023-04-08 LAB — BASIC METABOLIC PANEL
BUN: 17 mg/dL (ref 6–23)
CO2: 23 mEq/L (ref 19–32)
Calcium: 8.9 mg/dL (ref 8.4–10.5)
Chloride: 108 mEq/L (ref 96–112)
Creatinine, Ser: 0.9 mg/dL (ref 0.40–1.20)
GFR: 72.19 mL/min (ref >60.00)
Glucose, Bld: 90 mg/dL (ref 70–99)
Potassium: 4.1 mEq/L (ref 3.5–5.1)
Sodium: 142 mEq/L (ref 135–145)

## 2023-04-08 LAB — LIPID PANEL
Cholesterol: 169 mg/dL (ref 0–200)
HDL: 48.7 mg/dL (ref >39.00)
LDL Cholesterol: 106 mg/dL — ABNORMAL HIGH (ref 0–99)
NonHDL: 120.59
Total CHOL/HDL Ratio: 3
Triglycerides: 74 mg/dL (ref 0.0–149.0)
VLDL: 14.8 mg/dL (ref 0.0–40.0)

## 2023-04-10 ENCOUNTER — Encounter: Payer: Self-pay | Admitting: Internal Medicine

## 2023-04-10 NOTE — Assessment & Plan Note (Signed)
Persistent symptoms as outlined.  Appear to be c/w sinus infection/URI. She is better.  Discussed possibly - partially treated.  Will extend out augmentin x 1 more week.  Prednisone taper as directed.  Continue albuterol inhaler.  Saline and steroid nasal spray.  Follow.  Call with update.

## 2023-06-09 ENCOUNTER — Other Ambulatory Visit: Payer: Self-pay | Admitting: Internal Medicine

## 2023-06-09 DIAGNOSIS — Z1231 Encounter for screening mammogram for malignant neoplasm of breast: Secondary | ICD-10-CM

## 2023-07-05 ENCOUNTER — Ambulatory Visit: Payer: BC Managed Care – PPO | Admitting: Dermatology

## 2023-07-05 ENCOUNTER — Encounter: Payer: Self-pay | Admitting: Dermatology

## 2023-07-05 VITALS — BP 112/77 | HR 84

## 2023-07-05 DIAGNOSIS — W908XXA Exposure to other nonionizing radiation, initial encounter: Secondary | ICD-10-CM | POA: Diagnosis not present

## 2023-07-05 DIAGNOSIS — L821 Other seborrheic keratosis: Secondary | ICD-10-CM

## 2023-07-05 DIAGNOSIS — D239 Other benign neoplasm of skin, unspecified: Secondary | ICD-10-CM

## 2023-07-05 DIAGNOSIS — D2372 Other benign neoplasm of skin of left lower limb, including hip: Secondary | ICD-10-CM

## 2023-07-05 DIAGNOSIS — D229 Melanocytic nevi, unspecified: Secondary | ICD-10-CM

## 2023-07-05 DIAGNOSIS — D2261 Melanocytic nevi of right upper limb, including shoulder: Secondary | ICD-10-CM

## 2023-07-05 DIAGNOSIS — L578 Other skin changes due to chronic exposure to nonionizing radiation: Secondary | ICD-10-CM

## 2023-07-05 DIAGNOSIS — R202 Paresthesia of skin: Secondary | ICD-10-CM | POA: Diagnosis not present

## 2023-07-05 DIAGNOSIS — L918 Other hypertrophic disorders of the skin: Secondary | ICD-10-CM

## 2023-07-05 DIAGNOSIS — Z1283 Encounter for screening for malignant neoplasm of skin: Secondary | ICD-10-CM

## 2023-07-05 DIAGNOSIS — D225 Melanocytic nevi of trunk: Secondary | ICD-10-CM

## 2023-07-05 DIAGNOSIS — D2362 Other benign neoplasm of skin of left upper limb, including shoulder: Secondary | ICD-10-CM

## 2023-07-05 DIAGNOSIS — L814 Other melanin hyperpigmentation: Secondary | ICD-10-CM

## 2023-07-05 DIAGNOSIS — Z86018 Personal history of other benign neoplasm: Secondary | ICD-10-CM

## 2023-07-05 NOTE — Progress Notes (Signed)
Follow-Up Visit   Subjective  Angela Pacheco is a 57 y.o. female who presents for the following: Skin Cancer Screening and Full Body Skin Exam hx of dysplastic nevi, hx of dermatofibromas. Itchy areas of back.   The patient presents for Total-Body Skin Exam (TBSE) for skin cancer screening and mole check. The patient has spots, moles and lesions to be evaluated, some may be new or changing and the patient has concerns that these could be cancer.    The following portions of the chart were reviewed this encounter and updated as appropriate: medications, allergies, medical history  Review of Systems:  No other skin or systemic complaints except as noted in HPI or Assessment and Plan.  Objective  Well appearing patient in no apparent distress; mood and affect are within normal limits.  A full examination was performed including scalp, head, eyes, ears, nose, lips, neck, chest, axillae, abdomen, back, buttocks, bilateral upper extremities, bilateral lower extremities, hands, feet, fingers, toes, fingernails, and toenails. All findings within normal limits unless otherwise noted below.   Relevant physical exam findings are noted in the Assessment and Plan.    Assessment & Plan   SKIN CANCER SCREENING PERFORMED TODAY.  ACTINIC DAMAGE - Chronic condition, secondary to cumulative UV/sun exposure - diffuse scaly erythematous macules with underlying dyspigmentation - Recommend daily broad spectrum sunscreen SPF 30+ to sun-exposed areas, reapply every 2 hours as needed.  - Staying in the shade or wearing long sleeves, sun glasses (UVA+UVB protection) and wide brim hats (4-inch brim around the entire circumference of the hat) are also recommended for sun protection.  - Call for new or changing lesions.  LENTIGINES, SEBORRHEIC KERATOSES, HEMANGIOMAS - Benign normal skin lesions - Benign-appearing - Call for any changes Lentigo vs nevus Left palm 3 mm light tan  macule Benign-appearing.  Observation.  Call clinic for new or changing lesions.  Recommend daily use of broad spectrum spf 30+ sunscreen to sun-exposed areas.    Acrochordons (Skin Tags) axillary - Fleshy, skin-colored pedunculated papules - Benign appearing.  - Observe. - If desired, they can be removed with an in office procedure that is not covered by insurance. - Please call the clinic if you notice any new or changing lesions.  MELANOCYTIC NEVI - Tan-brown and/or pink-flesh-colored symmetric macules and papules - Benign appearing on exam today, photos compared, no changes - Observation - Call clinic for new or changing moles - Recommend daily use of broad spectrum spf 30+ sunscreen to sun-exposed areas.  Nevus  Right Shoulder and back 5.0 x 3.0 mm medium brown macule with darker center and tail, photo compared, no changes  Benign-appearing. Stable compared to previous visit. Observation.  Call clinic for new or changing moles.  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 Condition is symptomatic/ bothersome to patient. Not currently at goal.   Chronic condition without cure secondary to pinched nerve along spine causing itching or sensation changes in an area of skin. Chronic rubbing or scratching causes darkening of the skin.  OTC treatments which can help with itch include numbing creams like pramoxine or lidocaine which temporarily reduce itch or Capsaicin-containing creams which cause a burning sensation but which sometimes over time will reset the nerves to stop producing itch.  If you choose to use Capsaicin cream, it is recommended to use it 5  times daily for 1 week followed by 3 times daily for 3-6 weeks. You may have to continue using it long-term.  If not doing well with OTC options,  could consider [facility] compounded prescription anti-itch cream with Amitriptyline 5% / Lidocaine 5% / Pramoxine 1% or Amitriptyline 5% / Gabapentin 10% / Lidocaine 5% Cream or other prescription cream or pill options.     DERMATOFIBROMA Left Posterior Shoulder, left lower pretibia/anterior ankle   Exam: Firm pink/brown papulenodule with dimple sign. Treatment Plan: A dermatofibroma is a benign growth possibly related to trauma, such as an insect bite, cut from shaving, or inflamed acne-type bump.  Treatment options to remove include shave or excision with resulting scar and risk of recurrence.  Since benign-appearing and not bothersome, will observe for now.     HISTORY OF DYSPLASTIC NEVUS See history  No evidence of recurrence today Recommend regular full body skin exams Recommend daily broad spectrum sunscreen SPF 30+ to sun-exposed areas, reapply every 2 hours as needed.  Call if any new or changing lesions are noted between office visits   Return in about 1 year (around 07/04/2024) for TBSE.  I, Asher Muir, CMA, am acting as scribe for Willeen Niece, MD.   Documentation: I have reviewed the above documentation for accuracy and completeness, and I agree with the above.  Willeen Niece, MD

## 2023-07-05 NOTE — Patient Instructions (Addendum)
Basic OTC daily skin care regimen to prevent photoaging:   Recommend facial moisturizer with sunscreen SPF 30 every morning (OTC brands include CeraVe AM, Neutrogena, Eucerin, Cetaphil, Aveeno, La Roche Posay).  Can also apply a topical Vit C serum which is an antioxidant (OTC brands include CeraVe, La Roche Posay, and The Ordinary) underneath sunscreen in morning. If you are outside during the day in the summer for extended periods, especially swimming and/or sweating, make sure you apply a water resistant facial sunscreen lotion spf 30 or higher.   At night recommend a cream with retinol (a vitamin A derivative which stimulates collagen production) like CeraVe skin renewing retinol serum or ROC retinol correxion cream or Neutrogena rapid wrinkle repair cream. Retinol may cause skin irritation in people with sensitive skin.  Can use it every other day and/or apply on top of a hyaluronic acid (HA) moisturizer/serum (Neutrogena Hydroboost water cream) if better tolerated that way.  Retinol may also help with lightening brown spots.   Our office sells high quality, medically tested skin care lines such as Elta MD sunscreens (with Zinc), and Alastin skin care products, which are very effective in treating photoaging. The Alastin line includes cosmeceutical grade Vit.C serum, HA serum, Elastin stimulating moisturizers/serums, lightening serum, and sunscreens.  If you want prescription treatment, then you would need an appointment (Rx tretinoin and fade creams, Botox, filler injections, laser treatments, etc.) These prescriptions and procedures are not covered by insurance but work very well.    For itchy areas on back  OTC treatments which can help with itch include numbing creams like pramoxine or lidocaine which temporarily reduce itch or Capsaicin-containing creams which cause a burning sensation but which sometimes over time will reset the nerves to stop producing itch.  If you choose to use  Capsaicin cream, it is recommended to use it 5 times daily for 1 week followed by 3 times daily for 3-6 weeks. You may have to continue using it long-term Recommend OTC Gold Bond Rapid Relief Anti-Itch cream (pramoxine + menthol), CeraVe Anti-itch cream or lotion (pramoxine), Sarna lotion (Original- menthol + camphor or Sensitive- pramoxine) or Eucerin 12 hour Itch Relief lotion (menthol) up to 3 times per day to areas on body that are itchy.    Melanoma ABCDEs  Melanoma is the most dangerous type of skin cancer, and is the leading cause of death from skin disease.  You are more likely to develop melanoma if you: Have light-colored skin, light-colored eyes, or red or blond hair Spend a lot of time in the sun Tan regularly, either outdoors or in a tanning bed Have had blistering sunburns, especially during childhood Have a close family member who has had a melanoma Have atypical moles or large birthmarks  Early detection of melanoma is key since treatment is typically straightforward and cure rates are extremely high if we catch it early.   The first sign of melanoma is often a change in a mole or a new dark spot.  The ABCDE system is a way of remembering the signs of melanoma.  A for asymmetry:  The two halves do not match. B for border:  The edges of the growth are irregular. C for color:  A mixture of colors are present instead of an even brown color. D for diameter:  Melanomas are usually (but not always) greater than 6mm - the size of a pencil eraser. E for evolution:  The spot keeps changing in size, shape, and color.  Please check your  skin once per month between visits. You can use a small mirror in front and a large mirror behind you to keep an eye on the back side or your body.   If you see any new or changing lesions before your next follow-up, please call to schedule a visit.  Please continue daily skin protection including broad spectrum sunscreen SPF 30+ to sun-exposed  areas, reapplying every 2 hours as needed when you're outdoors.   Staying in the shade or wearing long sleeves, sun glasses (UVA+UVB protection) and wide brim hats (4-inch brim around the entire circumference of the hat) are also recommended for sun protection.    Due to recent changes in healthcare laws, you may see results of your pathology and/or laboratory studies on MyChart before the doctors have had a chance to review them. We understand that in some cases there may be results that are confusing or concerning to you. Please understand that not all results are received at the same time and often the doctors may need to interpret multiple results in order to provide you with the best plan of care or course of treatment. Therefore, we ask that you please give [place] 2 business days to thoroughly review all your results before contacting the office for clarification. Should we see a critical lab result, you will be contacted sooner.   If You Need Anything After Your Visit  If you have any questions or concerns for your doctor, please call our main line at [phone] and press option 4 to reach your doctor's medical assistant. If no one answers, please leave a voicemail as directed and we will return your call as soon as possible. Messages left after 4 pm will be answered the following business day.   You may also send [place] a message via MyChart. We typically respond to MyChart messages within 1-2 business days.  For prescription refills, please ask your pharmacy to contact our office. Our fax number is [phone].  If you have an urgent issue when the clinic is closed that cannot wait until the next business day, you can page your doctor at the number below.    Please note that while we do our best to be available for urgent issues outside of office hours, we are not available 24/7.   If you have an urgent issue and are unable to reach [place], you may choose to seek medical care at your doctor's  office, retail clinic, urgent care center, or emergency room.  If you have a medical emergency, please immediately call 911 or go to the emergency department.  Pager Numbers  - Dr. Gwen Pounds: [phone]  - Dr. Neale Burly: [phone]  - Dr. Roseanne Reno: [phone]  In the event of inclement weather, please call our main line at [phone] for an update on the status of any delays or closures.  Dermatology Medication Tips: Please keep the boxes that topical medications come in in order to help keep track of the instructions about where and how to use these. Pharmacies typically print the medication instructions only on the boxes and not directly on the medication tubes.   If your medication is too expensive, please contact our office at [phone] option 4 or send [place] a message through MyChart.   We are unable to tell what your co-pay for medications will be in advance as this is different depending on your insurance coverage. However, we may be able to find a substitute medication at lower cost or fill out paperwork to get insurance  to cover a needed medication.   If a prior authorization is required to get your medication covered by your insurance company, please allow [place] 1-2 business days to complete this process.  Drug prices often vary depending on where the prescription is filled and some pharmacies may offer cheaper prices.  The website www.goodrx.com contains coupons for medications through different pharmacies. The prices here do not account for what the cost may be with help from insurance (it may be cheaper with your insurance), but the website can give you the price if you did not use any insurance.  - You can print the associated coupon and take it with your prescription to the pharmacy.  - You may also stop by our office during regular business hours and pick up a GoodRx coupon card.  - If you need your prescription sent electronically to a different pharmacy, notify our office  through [facility] or by phone at [phone] option 4.     Si Usted Necesita Algo Despus de Su Visita  Tambin puede enviarnos un mensaje a travs de Clinical cytogeneticist. Por lo general respondemos a los mensajes de MyChart en el transcurso de 1 a 2 das hbiles.  Para renovar recetas, por favor pida a su farmacia que se ponga en contacto con nuestra oficina. Annie Sable de fax es [place] [phone].  Si tiene un asunto urgente cuando la clnica est cerrada y que no puede esperar hasta el siguiente da hbil, puede llamar/localizar a su doctor(a) al nmero que aparece a continuacin.   Por favor, tenga en cuenta que aunque hacemos todo lo posible para estar disponibles para asuntos urgentes fuera del horario de [place], no estamos disponibles las 24 horas del da, los 7 [address]  [address] de la [place].   Si tiene un problema urgente y no puede comunicarse con nosotros, puede optar por buscar atencin mdica  en el consultorio de su doctor(a), en una clnica privada, en un centro de atencin urgente o en una sala de emergencias.  Si tiene Engineer, drilling, por favor llame inmediatamente al 911 o vaya a la sala de emergencias.  Nmeros de bper  - Dr. Gwen Pounds: [phone]  - Dra. Moye: [phone]  - Dra. Roseanne Reno: [phone]  En caso de inclemencias del [place], por favor llame a Lacy Duverney principal al [phone] para una actualizacin sobre el [place] de cualquier retraso o cierre.  Consejos para la medicacin en dermatologa: Por favor, guarde las cajas en las que vienen los medicamentos de uso tpico para ayudarle a seguir las instrucciones sobre dnde y cmo usarlos. Las farmacias generalmente imprimen las instrucciones del medicamento slo en las cajas y no directamente en los tubos del [place].   Si su medicamento es muy caro, por favor, pngase en contacto con Rolm Gala llamando al [phone] y presione la opcin 4 o envenos un mensaje a travs de Clinical cytogeneticist.   No  podemos decirle cul ser su copago por los medicamentos por adelantado ya que esto es diferente dependiendo de la cobertura de su seguro. Sin embargo, es posible que podamos encontrar un medicamento sustituto a Audiological scientist un formulario para que el seguro cubra el medicamento que se considera necesario.   Si se requiere una autorizacin previa para que su compaa de seguros [place] su medicamento, por favor permtanos de 1 a 2 das hbiles para completar [address]  Los precios de los medicamentos varan con frecuencia dependiendo del Environmental consultant de dnde se surte la receta y alguna farmacias pueden ofrecer precios  ms baratos.  El sitio web www.goodrx.com tiene cupones para medicamentos de Health and safety inspector. Los precios aqu no tienen en cuenta lo que podra costar con la ayuda del seguro (puede ser ms barato con su seguro), pero el sitio web puede darle el precio si no utiliz Tourist information centre manager.  - Puede imprimir el cupn correspondiente y llevarlo con su receta a la farmacia.  - Tambin puede pasar por nuestra oficina durante el horario de atencin regular y Education officer, museum una tarjeta de cupones de GoodRx.  - Si necesita que su receta se enve electrnicamente a una farmacia diferente, informe a nuestra oficina a travs de MyChart de [place] o por telfono llamando al [phone] y presione la opcin 4.

## 2023-07-25 ENCOUNTER — Ambulatory Visit
Admission: RE | Admit: 2023-07-25 | Discharge: 2023-07-25 | Disposition: A | Discharge status: Home / Self Care | Payer: BC Managed Care – PPO | Source: Ambulatory Visit | Attending: Internal Medicine | Admitting: Internal Medicine

## 2023-07-25 DIAGNOSIS — Z1231 Encounter for screening mammogram for malignant neoplasm of breast: Secondary | ICD-10-CM | POA: Insufficient documentation

## 2023-08-01 ENCOUNTER — Encounter: Payer: Self-pay | Admitting: Internal Medicine

## 2023-08-01 ENCOUNTER — Ambulatory Visit (INDEPENDENT_AMBULATORY_CARE_PROVIDER_SITE_OTHER): Payer: BC Managed Care – PPO | Admitting: Internal Medicine

## 2023-08-01 VITALS — BP 116/70 | HR 76 | Temp 97.7°F | Ht 61.0 in | Wt 140.4 lb

## 2023-08-01 DIAGNOSIS — Z0000 Encounter for general adult medical examination without abnormal findings: Secondary | ICD-10-CM | POA: Diagnosis not present

## 2023-08-01 DIAGNOSIS — J4520 Mild intermittent asthma, uncomplicated: Secondary | ICD-10-CM

## 2023-08-01 DIAGNOSIS — K219 Gastro-esophageal reflux disease without esophagitis: Secondary | ICD-10-CM

## 2023-08-01 DIAGNOSIS — G4733 Obstructive sleep apnea (adult) (pediatric): Secondary | ICD-10-CM

## 2023-08-01 DIAGNOSIS — Z13220 Encounter for screening for lipoid disorders: Secondary | ICD-10-CM

## 2023-08-01 DIAGNOSIS — F439 Reaction to severe stress, unspecified: Secondary | ICD-10-CM

## 2023-08-01 DIAGNOSIS — E039 Hypothyroidism, unspecified: Secondary | ICD-10-CM

## 2023-08-01 LAB — CBC WITH DIFFERENTIAL/PLATELET
Basophils Absolute: 0 10*3/uL (ref 0.0–0.1)
Basophils Relative: 0.5 % (ref 0.0–3.0)
Eosinophils Absolute: 0.3 10*3/uL (ref 0.0–0.7)
Eosinophils Relative: 4.6 % (ref 0.0–5.0)
HCT: 44.7 % (ref 36.0–46.0)
Hemoglobin: 15 g/dL (ref 12.0–15.0)
Lymphocytes Relative: 34.2 % (ref 12.0–46.0)
Lymphs Abs: 2.4 10*3/uL (ref 0.7–4.0)
MCHC: 33.6 g/dL (ref 30.0–36.0)
MCV: 91.8 fl (ref 78.0–100.0)
Monocytes Absolute: 0.6 10*3/uL (ref 0.1–1.0)
Monocytes Relative: 8.4 % (ref 3.0–12.0)
Neutro Abs: 3.7 10*3/uL (ref 1.4–7.7)
Neutrophils Relative %: 52.3 % (ref 43.0–77.0)
Platelets: 347 10*3/uL (ref 150.0–400.0)
RBC: 4.87 Mil/uL (ref 3.87–5.11)
RDW: 13.4 % (ref 11.5–15.5)
WBC: 7.1 10*3/uL (ref 4.0–10.5)

## 2023-08-01 LAB — BASIC METABOLIC PANEL
BUN: 18 mg/dL (ref 6–23)
CO2: 25 mEq/L (ref 19–32)
Calcium: 9.7 mg/dL (ref 8.4–10.5)
Chloride: 106 mEq/L (ref 96–112)
Creatinine, Ser: 0.98 mg/dL (ref 0.40–1.20)
GFR: 65.03 mL/min (ref >60.00)
Glucose, Bld: 89 mg/dL (ref 70–99)
Potassium: 4.2 mEq/L (ref 3.5–5.1)
Sodium: 141 mEq/L (ref 135–145)

## 2023-08-01 LAB — HEPATIC FUNCTION PANEL
ALT: 70 U/L — ABNORMAL HIGH (ref 0–35)
AST: 42 U/L — ABNORMAL HIGH (ref 0–37)
Albumin: 4.3 g/dL (ref 3.5–5.2)
Alkaline Phosphatase: 73 U/L (ref 39–117)
Bilirubin, Direct: 0.1 mg/dL (ref 0.0–0.3)
Total Bilirubin: 0.8 mg/dL (ref 0.2–1.2)
Total Protein: 7.2 g/dL (ref 6.0–8.3)

## 2023-08-01 LAB — LIPID PANEL
Cholesterol: 209 mg/dL — ABNORMAL HIGH (ref 0–200)
HDL: 59.7 mg/dL (ref >39.00)
LDL Cholesterol: 133 mg/dL — ABNORMAL HIGH (ref 0–99)
NonHDL: 148.99
Total CHOL/HDL Ratio: 3
Triglycerides: 80 mg/dL (ref 0.0–149.0)
VLDL: 16 mg/dL (ref 0.0–40.0)

## 2023-08-01 LAB — TSH: TSH: 1.52 u[IU]/mL (ref 0.35–5.50)

## 2023-08-01 MED ORDER — LEVOTHYROXINE SODIUM 100 MCG PO TABS: ORAL_TABLET | ORAL | 5 refills | Status: DC

## 2023-08-01 NOTE — Assessment & Plan Note (Signed)
Protonix.  Follow.

## 2023-08-01 NOTE — Assessment & Plan Note (Addendum)
Stress better currently. Has seen a therapist.  Still talks to her occasionally. Some family stress. Does not feel needs any further intervention at this time.  Follow.

## 2023-08-01 NOTE — Assessment & Plan Note (Signed)
Has seen ENT. Oral appliance.  Continue.  Follow.

## 2023-08-01 NOTE — Assessment & Plan Note (Signed)
Breathing stable.

## 2023-08-01 NOTE — Progress Notes (Signed)
Subjective:    Patient ID: Angela Pacheco, female    DOB: Jan 31, 1966, 57 y.o.   MRN: [id number]  Patient here for  Chief Complaint  Patient presents with   Annual Exam    HPI Here for a physical exam. Saw gyn 08/11/22 - PAP - negative with negative HPV. Refilled estradiol.  Did go to pelvic floor PT.  Helped.  Calcium score - 0.  Stays active.  Exercises.  No chest pain or sob reported.  No cough or congestion.  No abdominal pain or bowel changes. Stress better during the summer.  Discussed work - upcoming year.  Overall doing well.    Past Medical History:  Diagnosis Date   Allergy    cats trigger   Arthritis June 2022   Asthma    mild   B12 deficiency 05/06/2019   Complication of anesthesia    Dysplastic nevus 11/01/2017   Right middle shoulder, mod, excision by Dr Cheree Ditto   Dysplastic nevus 07/03/2020   L medial scapular back, lat to T5, moderate, excision by Dr Cheree Ditto   Dysplastic nevus 06/09/2020   L scapular back lateral, mild   Hypothyroidism    Oxygen deficiency 2023   Use mouth sleep appliance. Respire   Sleep apnea 2023   Thyroid disease    Past Surgical History:  Procedure Laterality Date   ABDOMINAL HYSTERECTOMY     cervix left in place   ANTERIOR CRUCIATE LIGAMENT REPAIR Left 2017   APPENDECTOMY  12/27/1989   CHOLECYSTECTOMY     COLONOSCOPY WITH PROPOFOL N/A 10/08/2019   Procedure: COLONOSCOPY WITH PROPOFOL;  Surgeon: Toney Reil, MD;  Location: [facility] ENDOSCOPY;  Service: Gastroenterology;  Laterality: N/A;   Family History  Problem Relation Age of Onset   Arthritis Mother    Hearing loss Mother    Cancer Father    Heart disease Father    Arthritis Sister    Hearing loss Sister    Asthma Daughter    Heart disease Brother    Breast cancer Neg Hx    Social History   Socioeconomic History   Marital status: Married    Spouse name: Not on file   Number of children: Not on file   Years of education: Not on file   Highest education  level: Bachelor's degree (e.g., BA, AB, BS)  Occupational History   Not on file  Tobacco Use   Smoking status: Never   Smokeless tobacco: Never  Substance and Sexual Activity   Alcohol use: Yes    Comment: Seltzer maybe 1-2 a week   Drug use: No   Sexual activity: Yes    Birth control/protection: Post-menopausal    Comment: N/A  Other Topics Concern   Not on file  Social History Narrative   Not on file   Social Determinants of Health   Financial Resource Strain: Low Risk  (03/29/2023)   Overall Financial Resource Strain (CARDIA)    Difficulty of Paying Living Expenses: Not very hard  Food Insecurity: No Food Insecurity (03/29/2023)   Hunger Vital Sign    Worried About Running Out of Food in the Last Year: Never true    Ran Out of Food in the Last Year: Never true  Transportation Needs: No Transportation Needs (03/29/2023)   PRAPARE - Administrator, Civil Service (Medical): No    Lack of Transportation (Non-Medical): No  Physical Activity: Insufficiently Active (03/29/2023)   Exercise Vital Sign    Days of Exercise per Week:  4 days    Minutes of Exercise per Session: 30 min  Stress: No Stress Concern Present (03/29/2023)   [employer] of Occupational Health - Occupational Stress Questionnaire    Feeling of Stress : Only a little  Social Connections: Socially Integrated (03/29/2023)   Social Connection and Isolation Panel [NHANES]    Frequency of Communication with Friends and Family: More than three times a week    Frequency of Social Gatherings with Friends and Family: Once a week    Attends Religious Services: More than 4 times per year    Active Member of [employer] or Organizations: Yes    Attends Engineer, structural: More than 4 times per year    Marital Status: Married     Review of Systems  Constitutional:  Negative for appetite change and unexpected weight change.  HENT:  Negative for congestion, sinus pressure and sore throat.   Eyes:  Negative  for pain and visual disturbance.  Respiratory:  Negative for cough, chest tightness and shortness of breath.   Cardiovascular:  Negative for chest pain, palpitations and leg swelling.  Gastrointestinal:  Negative for abdominal pain, diarrhea, nausea and vomiting.  Genitourinary:  Negative for difficulty urinating and dysuria.  Musculoskeletal:  Negative for joint swelling and myalgias.  Skin:  Negative for color change and rash.  Neurological:  Negative for dizziness and headaches.  Hematological:  Negative for adenopathy. Does not bruise/bleed easily.  Psychiatric/Behavioral:  Negative for agitation and dysphoric mood.        Objective:     BP 116/70   Pulse 76   Temp 97.7 F (36.5 C) (Oral)   Ht 5\' 1"  (1.549 m)   Wt 140 lb 6.4 oz (63.7 kg)   SpO2 98%   BMI 26.53 kg/m  Wt Readings from Last 3 Encounters:  08/01/23 140 lb 6.4 oz (63.7 kg)  04/07/23 138 lb (62.6 kg)  02/28/23 141 lb (64 kg)    Physical Exam Vitals reviewed.  Constitutional:      General: She is not in acute distress.    Appearance: Normal appearance.  HENT:     Head: Normocephalic and atraumatic.     Right Ear: External ear normal.     Left Ear: External ear normal.  Eyes:     General: No scleral icterus.       Right eye: No discharge.        Left eye: No discharge.     Conjunctiva/sclera: Conjunctivae normal.  Neck:     Thyroid: No thyromegaly.  Cardiovascular:     Rate and Rhythm: Normal rate and regular rhythm.  Pulmonary:     Effort: No respiratory distress.     Breath sounds: Normal breath sounds. No wheezing.     Comments: Breast exam - per gyn.  Abdominal:     General: Bowel sounds are normal.     Palpations: Abdomen is soft.     Tenderness: There is no abdominal tenderness.  Genitourinary:    Comments: Pap - per gyn Musculoskeletal:        General: No swelling or tenderness.     Cervical back: Neck supple. No tenderness.  Lymphadenopathy:     Cervical: No cervical adenopathy.   Skin:    Findings: No erythema or rash.  Neurological:     Mental Status: She is alert.  Psychiatric:        Mood and Affect: Mood normal.        Behavior: Behavior normal.  Outpatient Encounter Medications as of 08/01/2023  Medication Sig   albuterol (VENTOLIN HFA) 108 (90 Base) MCG/ACT inhaler INHALE 2 PUFFS INTO THE LUNGS EVERY 6 HOURS AS NEEDED FOR WHEEZING OR SHORTNESS OF BREATH   Calcium Carbonate-Vitamin D (CALTRATE 600+D PO) Take 1 tablet by mouth 2 (two) times daily.   cyanocobalamin (VITAMIN B12) 1000 MCG/ML injection INJECT 1 ML IN THE MUSCLE EVERY 30 DAYS   estradiol (ESTRACE) 0.1 MG/GM vaginal cream Place 1 Applicatorful vaginally once a week.   Magnesium 400 MG CAPS Take 1 tablet by mouth daily at 6 (six) AM.   pantoprazole (PROTONIX) 40 MG tablet Take 1 tablet (40 mg total) by mouth daily.   sucralfate (CARAFATE) 1 g tablet Take 1 g by mouth 2 (two) times daily.   SYRINGE-NEEDLE, DISP, 3 ML (BD INTEGRA SYRINGE) 25G X 1" 3 ML MISC Use as directed with B12 injections   Triamcinolone Acetonide (NASACORT AQ NA) Place into the nose.   vitamin C (ASCORBIC ACID) 500 MG tablet Take 500 mg by mouth daily.   fluconazole (DIFLUCAN) 150 MG tablet Take 1 tablet x 1.  May repeat x 1 if persistent symptoms after three days   levothyroxine (SYNTHROID) 100 MCG tablet TAKE 1 TABLET(100 MCG) BY MOUTH DAILY   [DISCONTINUED] levothyroxine (SYNTHROID) 100 MCG tablet TAKE 1 TABLET(100 MCG) BY MOUTH DAILY   No facility-administered encounter medications on file as of 08/01/2023.     Lab Results  Component Value Date   WBC 7.8 10/25/2022   HGB 14.4 10/25/2022   HCT 42.1 10/25/2022   PLT 320.0 10/25/2022   GLUCOSE 90 04/08/2023   CHOL 169 04/08/2023   TRIG 74.0 04/08/2023   HDL 48.70 04/08/2023   LDLCALC 106 (H) 04/08/2023   ALT 32 04/08/2023   AST 16 04/08/2023   NA 142 04/08/2023   K 4.1 04/08/2023   CL 108 04/08/2023   CREATININE 0.90 04/08/2023   BUN 17 04/08/2023   CO2 23  04/08/2023   TSH 0.72 10/25/2022    MM 3D SCREENING MAMMOGRAM BILATERAL BREAST  Result Date: 07/26/2023 CLINICAL DATA:  Screening. EXAM: DIGITAL SCREENING BILATERAL MAMMOGRAM WITH TOMOSYNTHESIS AND CAD TECHNIQUE: Bilateral screening digital craniocaudal and mediolateral oblique mammograms were obtained. Bilateral screening digital breast tomosynthesis was performed. The images were evaluated with computer-aided detection. COMPARISON:  Previous exam(s). ACR Breast Density Category b: There are scattered areas of fibroglandular density. FINDINGS: There are no findings suspicious for malignancy. IMPRESSION: No mammographic evidence of malignancy. A result letter of this screening mammogram will be mailed directly to the patient. RECOMMENDATION: Screening mammogram in one year. (Code:SM-B-01Y) BI-RADS CATEGORY  1: Negative. Electronically Signed   By: Edwin Cap M.D.   On: 07/26/2023 14:24       Assessment & Plan:  Routine general medical examination at a health care facility  Screening cholesterol level -     Lipid panel  Hypothyroidism, unspecified type Assessment & Plan: On thyroid replacement.  Follow tsh.   Orders: -     Basic metabolic panel -     Hepatic function panel -     TSH -     CBC with Differential/Platelet  Health care maintenance Assessment & Plan: Physical today 08/01/23.   Mammogram 07/25/23 - Birads I.  Colonoscopy 09/2019 - one polyp.  Recommended f/u in 7 years.  PAP gyn 08/11/22 - negative with negative HPV.    Mild intermittent asthma without complication Assessment & Plan: Breathing stable.    Gastroesophageal reflux disease, unspecified  whether esophagitis present Assessment & Plan: Protonix.  Follow.    OSA (obstructive sleep apnea) Assessment & Plan: Has seen ENT. Oral appliance.  Continue.  Follow.    Stress Assessment & Plan: Stress better currently. Has seen a therapist.  Still talks to her occasionally. Some family stress. Does not feel  needs any further intervention at this time.  Follow.    Other orders -     Levothyroxine Sodium; TAKE 1 TABLET(100 MCG) BY MOUTH DAILY  Dispense: 30 tablet; Refill: 5     Dale Kiana, MD

## 2023-08-01 NOTE — Assessment & Plan Note (Signed)
Physical today 08/01/23.   Mammogram 07/25/23 - Birads I.  Colonoscopy 09/2019 - one polyp.  Recommended f/u in 7 years.  PAP gyn 08/11/22 - negative with negative HPV.

## 2023-08-01 NOTE — Assessment & Plan Note (Signed)
On thyroid replacement.  Follow tsh.  

## 2023-08-02 ENCOUNTER — Other Ambulatory Visit: Payer: Self-pay

## 2023-08-02 DIAGNOSIS — R7989 Other specified abnormal findings of blood chemistry: Secondary | ICD-10-CM

## 2023-08-03 ENCOUNTER — Telehealth: Payer: Self-pay | Admitting: Internal Medicine

## 2023-08-03 DIAGNOSIS — R7989 Other specified abnormal findings of blood chemistry: Secondary | ICD-10-CM

## 2023-08-03 NOTE — Telephone Encounter (Signed)
Lab ordered.

## 2023-08-03 NOTE — Telephone Encounter (Signed)
Patient need lab orders.

## 2023-08-11 ENCOUNTER — Other Ambulatory Visit (INDEPENDENT_AMBULATORY_CARE_PROVIDER_SITE_OTHER): Payer: BC Managed Care – PPO

## 2023-08-11 DIAGNOSIS — R7989 Other specified abnormal findings of blood chemistry: Secondary | ICD-10-CM

## 2023-08-11 LAB — HEPATIC FUNCTION PANEL
ALT: 45 U/L — ABNORMAL HIGH (ref 0–35)
AST: 28 U/L (ref 0–37)
Albumin: 4.4 g/dL (ref 3.5–5.2)
Alkaline Phosphatase: 69 U/L (ref 39–117)
Bilirubin, Direct: 0.1 mg/dL (ref 0.0–0.3)
Total Bilirubin: 0.7 mg/dL (ref 0.2–1.2)
Total Protein: 6.9 g/dL (ref 6.0–8.3)

## 2023-08-12 ENCOUNTER — Other Ambulatory Visit: Payer: BC Managed Care – PPO

## 2023-08-15 ENCOUNTER — Other Ambulatory Visit: Payer: Self-pay

## 2023-08-15 DIAGNOSIS — R7989 Other specified abnormal findings of blood chemistry: Secondary | ICD-10-CM

## 2023-09-29 ENCOUNTER — Other Ambulatory Visit: Payer: Self-pay | Admitting: Internal Medicine

## 2023-10-03 ENCOUNTER — Telehealth: Payer: BC Managed Care – PPO | Admitting: Physician Assistant

## 2023-10-03 DIAGNOSIS — J019 Acute sinusitis, unspecified: Secondary | ICD-10-CM | POA: Diagnosis not present

## 2023-10-03 DIAGNOSIS — B9689 Other specified bacterial agents as the cause of diseases classified elsewhere: Secondary | ICD-10-CM | POA: Diagnosis not present

## 2023-10-03 MED ORDER — DOXYCYCLINE HYCLATE 100 MG PO TABS
100.0000 mg | ORAL_TABLET | Freq: Two times a day (BID) | ORAL | 0 refills | Status: DC
Start: 2023-10-03 — End: 2023-10-14

## 2023-10-03 NOTE — Progress Notes (Signed)
E-Visit for Sinus Problems  We are sorry that you are not feeling well.  Here is how we plan to help!  Based on what you have shared with me it looks like you have sinusitis.  Sinusitis is inflammation and infection in the sinus cavities of the head.  Based on your presentation I believe you most likely have Acute Bacterial Sinusitis.  This is an infection caused by bacteria and is treated with antibiotics. I have prescribed Doxycycline 100mg by mouth twice a day for 10 days. You may use an oral decongestant such as Mucinex D or if you have glaucoma or high blood pressure use plain Mucinex. Saline nasal spray help and can safely be used as often as needed for congestion.  If you develop worsening sinus pain, fever or notice severe headache and vision changes, or if symptoms are not better after completion of antibiotic, please schedule an appointment with a health care provider.    Sinus infections are not as easily transmitted as other respiratory infection, however we still recommend that you avoid close contact with loved ones, especially the very young and elderly.  Remember to wash your hands thoroughly throughout the day as this is the number one way to prevent the spread of infection!  Home Care: Only take medications as instructed by your medical team. Complete the entire course of an antibiotic. Do not take these medications with alcohol. A steam or ultrasonic humidifier can help congestion.  You can place a towel over your head and breathe in the steam from hot water coming from a faucet. Avoid close contacts especially the very young and the elderly. Cover your mouth when you cough or sneeze. Always remember to wash your hands.  Get Help Right Away If: You develop worsening fever or sinus pain. You develop a severe head ache or visual changes. Your symptoms persist after you have completed your treatment plan.  Make sure you Understand these instructions. Will watch your  condition. Will get help right away if you are not doing well or get worse.  Thank you for choosing an e-visit.  Your e-visit answers were reviewed by a board certified advanced clinical practitioner to complete your personal care plan. Depending upon the condition, your plan could have included both over the counter or prescription medications.  Please review your pharmacy choice. Make sure the pharmacy is open so you can pick up prescription now. If there is a problem, you may contact your provider through MyChart messaging and have the prescription routed to another pharmacy.  Your safety is important to us. If you have drug allergies check your prescription carefully.   For the next 24 hours you can use MyChart to ask questions about today's visit, request a non-urgent call back, or ask for a work or school excuse. You will get an email in the next two days asking about your experience. I hope that your e-visit has been valuable and will speed your recovery.  I have spent 5 minutes in review of e-visit questionnaire, review and updating patient chart, medical decision making and response to patient.   Shannon Kirkendall M Martika Egler, PA-C  

## 2023-10-12 ENCOUNTER — Encounter: Payer: Self-pay | Admitting: Internal Medicine

## 2023-10-12 NOTE — Telephone Encounter (Signed)
PATIENT SCHEDULED WITH DR Lorin Picket

## 2023-10-12 NOTE — Telephone Encounter (Signed)
Dr Lorin Picket- I scheduled her to see you Friday at 7 am. She did an evisit and was given doxy. Has had little to no improvement. I did confirm no sob, chest tightness, fever, etc. She was ok with visit. Wanted to come in. She was covid negative twice last week. Was advised to wear a mask

## 2023-10-12 NOTE — Telephone Encounter (Signed)
Agree with evaluation

## 2023-10-14 ENCOUNTER — Encounter: Payer: Self-pay | Admitting: Internal Medicine

## 2023-10-14 ENCOUNTER — Ambulatory Visit: Payer: BC Managed Care – PPO | Admitting: Internal Medicine

## 2023-10-14 VITALS — BP 114/70 | HR 72 | Temp 98.2°F | Resp 16 | Ht 61.0 in | Wt 130.8 lb

## 2023-10-14 DIAGNOSIS — F439 Reaction to severe stress, unspecified: Secondary | ICD-10-CM | POA: Diagnosis not present

## 2023-10-14 DIAGNOSIS — J329 Chronic sinusitis, unspecified: Secondary | ICD-10-CM | POA: Diagnosis not present

## 2023-10-14 MED ORDER — FLUCONAZOLE 150 MG PO TABS: ORAL_TABLET | ORAL | 0 refills | Status: DC

## 2023-10-14 MED ORDER — CEFDINIR 300 MG PO CAPS: 300.0000 mg | ORAL_CAPSULE | Freq: Two times a day (BID) | ORAL | 0 refills | Status: DC

## 2023-10-14 MED ORDER — PREDNISONE 10 MG PO TABS: ORAL_TABLET | ORAL | 0 refills | Status: DC

## 2023-10-14 NOTE — Assessment & Plan Note (Signed)
Symptoms and exam c/w sinus infection. Completing doxycycline.  Persistent symptoms as outlined.  Treat with omnicef as directed.  Prednisone taper.  Afrin nasal spray - 2 spray each nostril 2x/day for three days only.  Continue nasal sprays.  Hold zyrtec. Follow.  Call with update.

## 2023-10-14 NOTE — Progress Notes (Signed)
Subjective:    Patient ID: Estill Batten, female    DOB: [birth year], 57 y.o.   MRN: [id number]  Patient here for  Chief Complaint  Patient presents with   Sinusitis    HPI Work in appt.  Work in for sinus infection. Had E-visit 10/03/23 - diagnosed with sinus infection. Prescribed doxycycline. Tested for covid 10/02/23 and 09/29/23 - negative. Per note review, symptoms started 09/28/23 - nasal congestion and increased sinus pressure.  Described thick, yellow/green mucus.  Comes in today with persistent sinus symptoms. Headache is better.  Increase right side sinus pressure.  Nasal stuffiness. Right ear feels full.  No increased chest congestion.  Has had to use her inhaler this past week. Took another covid test yesterday.  Negative.  No vomiting or diarrhea.  Feels like typical sinus infection.     Past Medical History:  Diagnosis Date   Allergy    cats trigger   Arthritis June 2022   Asthma    mild   B12 deficiency 05/06/2019   Complication of anesthesia    Dysplastic nevus 11/01/2017   Right middle shoulder, mod, excision by Dr Cheree Ditto   Dysplastic nevus 07/03/2020   L medial scapular back, lat to T5, moderate, excision by Dr Cheree Ditto   Dysplastic nevus 06/09/2020   L scapular back lateral, mild   Hypothyroidism    Oxygen deficiency 2023   Use mouth sleep appliance. Respire   Sleep apnea 2023   Thyroid disease    Past Surgical History:  Procedure Laterality Date   ABDOMINAL HYSTERECTOMY     cervix left in place   ANTERIOR CRUCIATE LIGAMENT REPAIR Left 2017   APPENDECTOMY  12/27/1989   CHOLECYSTECTOMY     COLONOSCOPY WITH PROPOFOL N/A 10/08/2019   Procedure: COLONOSCOPY WITH PROPOFOL;  Surgeon: Toney Reil, MD;  Location: [facility] ENDOSCOPY;  Service: Gastroenterology;  Laterality: N/A;   Family History  Problem Relation Age of Onset   Arthritis Mother    Hearing loss Mother    Cancer Father    Heart disease Father    Arthritis Sister    Hearing loss  Sister    Asthma Daughter    Heart disease Brother    Breast cancer Neg Hx    Social History   Socioeconomic History   Marital status: Married    Spouse name: Not on file   Number of children: Not on file   Years of education: Not on file   Highest education level: Bachelor's degree (e.g., BA, AB, BS)  Occupational History   Not on file  Tobacco Use   Smoking status: Never   Smokeless tobacco: Never  Substance and Sexual Activity   Alcohol use: Yes    Comment: Seltzer maybe 1-2 a week   Drug use: No   Sexual activity: Yes    Birth control/protection: Post-menopausal    Comment: N/A  Other Topics Concern   Not on file  Social History Narrative   Not on file   Social Determinants of Health   Financial Resource Strain: Low Risk  (10/14/2023)   Overall Financial Resource Strain (CARDIA)    Difficulty of Paying Living Expenses: Not hard at all  Food Insecurity: No Food Insecurity (10/14/2023)   Hunger Vital Sign    Worried About Running Out of Food in the Last Year: Never true    Ran Out of Food in the Last Year: Never true  Transportation Needs: No Transportation Needs (10/14/2023)   PRAPARE - Transportation  Lack of Transportation (Medical): No    Lack of Transportation (Non-Medical): No  Physical Activity: Insufficiently Active (10/14/2023)   Exercise Vital Sign    Days of Exercise per Week: 4 days    Minutes of Exercise per Session: 30 min  Stress: No Stress Concern Present (10/14/2023)   [employer] of Occupational Health - Occupational Stress Questionnaire    Feeling of Stress : Only a little  Social Connections: Socially Integrated (10/14/2023)   Social Connection and Isolation Panel [NHANES]    Frequency of Communication with Friends and Family: More than three times a week    Frequency of Social Gatherings with Friends and Family: Once a week    Attends Religious Services: More than 4 times per year    Active Member of [employer] or Organizations: Yes     Attends Engineer, structural: More than 4 times per year    Marital Status: Married     Review of Systems  Constitutional:  Negative for appetite change and fever.  HENT:  Positive for congestion and sinus pressure.   Respiratory:  Negative for chest tightness.        No increased cough or sob.   Cardiovascular:  Negative for chest pain, palpitations and leg swelling.  Gastrointestinal:  Negative for abdominal pain, diarrhea, nausea and vomiting.  Genitourinary:  Negative for difficulty urinating and dysuria.  Musculoskeletal:  Negative for joint swelling and myalgias.  Skin:  Negative for color change and rash.  Neurological:  Negative for dizziness.       Headache better.    Psychiatric/Behavioral:  Negative for agitation and dysphoric mood.        Objective:     BP 114/70   Pulse 72   Temp 98.2 F (36.8 C)   Resp 16   Ht 5\' 1"  (1.549 m)   Wt 130 lb 12.8 oz (59.3 kg)   SpO2 98%   BMI 24.71 kg/m  Wt Readings from Last 3 Encounters:  10/14/23 130 lb 12.8 oz (59.3 kg)  08/01/23 140 lb 6.4 oz (63.7 kg)  04/07/23 138 lb (62.6 kg)    Physical Exam Vitals reviewed.  Constitutional:      General: She is not in acute distress.    Appearance: Normal appearance.  HENT:     Head: Normocephalic and atraumatic.     Comments: Increased sinus pressure to palpation - right frontal and right maxillary sinus pressure    Right Ear: Tympanic membrane, ear canal and external ear normal.     Left Ear: Tympanic membrane, ear canal and external ear normal.     Nose:     Comments: Right nares - erythematous turbinates.  Eyes:     General: No scleral icterus.       Right eye: No discharge.        Left eye: No discharge.     Conjunctiva/sclera: Conjunctivae normal.  Neck:     Thyroid: No thyromegaly.  Cardiovascular:     Rate and Rhythm: Normal rate and regular rhythm.  Pulmonary:     Effort: No respiratory distress.     Breath sounds: Normal breath sounds. No wheezing.   Abdominal:     General: Bowel sounds are normal.     Palpations: Abdomen is soft.     Tenderness: There is no abdominal tenderness.  Musculoskeletal:        General: No swelling or tenderness.     Cervical back: Neck supple. No tenderness.  Lymphadenopathy:  Cervical: No cervical adenopathy.  Skin:    Findings: No erythema or rash.  Neurological:     Mental Status: She is alert.  Psychiatric:        Mood and Affect: Mood normal.        Behavior: Behavior normal.      Outpatient Encounter Medications as of 10/14/2023  Medication Sig   cefdinir (OMNICEF) 300 MG capsule Take 1 capsule (300 mg total) by mouth 2 (two) times daily.   fluconazole (DIFLUCAN) 150 MG tablet Take one tablet x 1.  May repeat x 1 in three days if symptoms persist   predniSONE (DELTASONE) 10 MG tablet Take 4 tablets x 1 day and then decrease by 1/2 tablet per day until down to zero mg.   albuterol (VENTOLIN HFA) 108 (90 Base) MCG/ACT inhaler INHALE 2 PUFFS INTO THE LUNGS EVERY 6 HOURS AS NEEDED FOR WHEEZING OR SHORTNESS OF BREATH   Calcium Carbonate-Vitamin D (CALTRATE 600+D PO) Take 1 tablet by mouth 2 (two) times daily.   cyanocobalamin (VITAMIN B12) 1000 MCG/ML injection INJECT 1 ML IN THE MUSCLE EVERY 30 DAYS   estradiol (ESTRACE) 0.1 MG/GM vaginal cream Place 1 Applicatorful vaginally once a week.   levothyroxine (SYNTHROID) 100 MCG tablet TAKE 1 TABLET(100 MCG) BY MOUTH DAILY   Magnesium 400 MG CAPS Take 1 tablet by mouth daily at 6 (six) AM.   pantoprazole (PROTONIX) 40 MG tablet Take 1 tablet (40 mg total) by mouth daily.   sucralfate (CARAFATE) 1 g tablet Take 1 g by mouth 2 (two) times daily.   SYRINGE-NEEDLE, DISP, 3 ML (BD INTEGRA SYRINGE) 25G X 1" 3 ML MISC Use as directed with B12 injections   Triamcinolone Acetonide (NASACORT AQ NA) Place into the nose.   vitamin C (ASCORBIC ACID) 500 MG tablet Take 500 mg by mouth daily.   [DISCONTINUED] doxycycline (VIBRA-TABS) 100 MG tablet Take 1 tablet  (100 mg total) by mouth 2 (two) times daily.   No facility-administered encounter medications on file as of 10/14/2023.     Lab Results  Component Value Date   WBC 7.1 08/01/2023   HGB 15.0 08/01/2023   HCT 44.7 08/01/2023   PLT 347.0 08/01/2023   GLUCOSE 89 08/01/2023   CHOL 209 (H) 08/01/2023   TRIG 80.0 08/01/2023   HDL 59.70 08/01/2023   LDLCALC 133 (H) 08/01/2023   ALT 45 (H) 08/11/2023   AST 28 08/11/2023   NA 141 08/01/2023   K 4.2 08/01/2023   CL 106 08/01/2023   CREATININE 0.98 08/01/2023   BUN 18 08/01/2023   CO2 25 08/01/2023   TSH 1.52 08/01/2023    MM 3D SCREENING MAMMOGRAM BILATERAL BREAST  Result Date: 07/26/2023 CLINICAL DATA:  Screening. EXAM: DIGITAL SCREENING BILATERAL MAMMOGRAM WITH TOMOSYNTHESIS AND CAD TECHNIQUE: Bilateral screening digital craniocaudal and mediolateral oblique mammograms were obtained. Bilateral screening digital breast tomosynthesis was performed. The images were evaluated with computer-aided detection. COMPARISON:  Previous exam(s). ACR Breast Density Category b: There are scattered areas of fibroglandular density. FINDINGS: There are no findings suspicious for malignancy. IMPRESSION: No mammographic evidence of malignancy. A result letter of this screening mammogram will be mailed directly to the patient. RECOMMENDATION: Screening mammogram in one year. (Code:SM-B-01Y) BI-RADS CATEGORY  1: Negative. Electronically Signed   By: Edwin Cap M.D.   On: 07/26/2023 14:24       Assessment & Plan:  Sinusitis, unspecified chronicity, unspecified location Assessment & Plan: Symptoms and exam c/w sinus infection. Completing doxycycline.  Persistent  symptoms as outlined.  Treat with omnicef as directed.  Prednisone taper.  Afrin nasal spray - 2 spray each nostril 2x/day for three days only.  Continue nasal sprays.  Hold zyrtec. Follow.  Call with update.    Stress Assessment & Plan: Appears to be handling things well. Follow.    Other  orders -     predniSONE; Take 4 tablets x 1 day and then decrease by 1/2 tablet per day until down to zero mg.  Dispense: 18 tablet; Refill: 0 -     Cefdinir; Take 1 capsule (300 mg total) by mouth 2 (two) times daily.  Dispense: 20 capsule; Refill: 0 -     Fluconazole; Take one tablet x 1.  May repeat x 1 in three days if symptoms persist  Dispense: 2 tablet; Refill: 0     Dale Fostoria, MD

## 2023-10-14 NOTE — Assessment & Plan Note (Signed)
Appears to be handling things well.  Follow.  

## 2023-10-21 ENCOUNTER — Other Ambulatory Visit (INDEPENDENT_AMBULATORY_CARE_PROVIDER_SITE_OTHER): Payer: BC Managed Care – PPO

## 2023-10-21 DIAGNOSIS — R7989 Other specified abnormal findings of blood chemistry: Secondary | ICD-10-CM | POA: Diagnosis not present

## 2023-10-21 LAB — HEPATIC FUNCTION PANEL
ALT: 23 U/L (ref 0–35)
AST: 16 U/L (ref 0–37)
Albumin: 4.2 g/dL (ref 3.5–5.2)
Alkaline Phosphatase: 64 U/L (ref 39–117)
Bilirubin, Direct: 0.1 mg/dL (ref 0.0–0.3)
Total Bilirubin: 0.5 mg/dL (ref 0.2–1.2)
Total Protein: 7 g/dL (ref 6.0–8.3)

## 2023-12-04 ENCOUNTER — Telehealth: Payer: BC Managed Care – PPO | Admitting: Family Medicine

## 2023-12-04 DIAGNOSIS — B9689 Other specified bacterial agents as the cause of diseases classified elsewhere: Secondary | ICD-10-CM

## 2023-12-04 DIAGNOSIS — J019 Acute sinusitis, unspecified: Secondary | ICD-10-CM | POA: Diagnosis not present

## 2023-12-04 MED ORDER — PREDNISONE 20 MG PO TABS: 20.0000 mg | ORAL_TABLET | Freq: Two times a day (BID) | ORAL | 0 refills | Status: AC

## 2023-12-04 MED ORDER — DOXYCYCLINE HYCLATE 100 MG PO TABS: 100.0000 mg | ORAL_TABLET | Freq: Two times a day (BID) | ORAL | 0 refills | Status: AC

## 2023-12-04 NOTE — Progress Notes (Signed)
E-Visit for Sinus Problems  We are sorry that you are not feeling well.  Here is how we plan to help!  Based on what you have shared with me it looks like you have sinusitis.  Sinusitis is inflammation and infection in the sinus cavities of the head.  Based on your presentation I believe you most likely have Acute Bacterial Sinusitis.  This is an infection caused by bacteria and is treated with antibiotics. I have prescribed Doxycycline 100mg  by mouth twice a day for 10 days. You may use an oral decongestant such as Mucinex D or if you have glaucoma or high blood pressure use plain Mucinex. Saline nasal spray help and can safely be used as often as needed for congestion.  If you develop worsening sinus pain, fever or notice severe headache and vision changes, or if symptoms are not better after completion of antibiotic, please schedule an appointment with a health care provider.    Sinus infections are not as easily transmitted as other respiratory infection, however we still recommend that you avoid close contact with loved ones, especially the very young and elderly.  Remember to wash your hands thoroughly throughout the day as this is the number one way to prevent the spread of infection!  Prednisone also sent.   Home Care: Only take medications as instructed by your medical team. Complete the entire course of an antibiotic. Do not take these medications with alcohol. A steam or ultrasonic humidifier can help congestion.  You can place a towel over your head and breathe in the steam from hot water coming from a faucet. Avoid close contacts especially the very young and the elderly. Cover your mouth when you cough or sneeze. Always remember to wash your hands.  Get Help Right Away If: You develop worsening fever or sinus pain. You develop a severe head ache or visual changes. Your symptoms persist after you have completed your treatment plan.  Make sure you Understand these  instructions. Will watch your condition. Will get help right away if you are not doing well or get worse.  Thank you for choosing an e-visit.  Your e-visit answers were reviewed by a board certified advanced clinical practitioner to complete your personal care plan. Depending upon the condition, your plan could have included both over the counter or prescription medications.  Please review your pharmacy choice. Make sure the pharmacy is open so you can pick up prescription now. If there is a problem, you may contact your provider through [employer] and have the prescription routed to another pharmacy.  Your safety is important to [place]. If you have drug allergies check your prescription carefully.   For the next 24 hours you can use MyChart to ask questions about today's visit, request a non-urgent call back, or ask for a work or school excuse. You will get an email in the next two days asking about your experience. I hope that your e-visit has been valuable and will speed your recovery.    have provided 5 minutes of non face to face time during this encounter for chart review and documentation.

## 2023-12-14 ENCOUNTER — Encounter: Payer: Self-pay | Admitting: Internal Medicine

## 2023-12-14 ENCOUNTER — Other Ambulatory Visit: Payer: Self-pay | Admitting: Internal Medicine

## 2023-12-14 ENCOUNTER — Other Ambulatory Visit: Payer: Self-pay

## 2023-12-14 MED ORDER — "BD INTEGRA SYRINGE 25G X 1"" 3 ML MISC": 0 refills | Status: AC

## 2023-12-14 MED ORDER — CYANOCOBALAMIN 1000 MCG/ML IJ SOLN: INTRAMUSCULAR | 0 refills | Status: DC

## 2024-01-24 ENCOUNTER — Encounter: Payer: Self-pay | Admitting: Internal Medicine

## 2024-01-26 NOTE — Telephone Encounter (Signed)
Patient rescheduled.

## 2024-01-31 ENCOUNTER — Encounter: Payer: Self-pay | Admitting: Internal Medicine

## 2024-01-31 ENCOUNTER — Ambulatory Visit (INDEPENDENT_AMBULATORY_CARE_PROVIDER_SITE_OTHER): Payer: 59 | Admitting: Internal Medicine

## 2024-01-31 VITALS — BP 106/70 | HR 64 | Temp 98.2°F | Resp 16 | Ht 60.0 in | Wt 122.2 lb

## 2024-01-31 DIAGNOSIS — E039 Hypothyroidism, unspecified: Secondary | ICD-10-CM

## 2024-01-31 DIAGNOSIS — E7800 Pure hypercholesterolemia, unspecified: Secondary | ICD-10-CM

## 2024-01-31 DIAGNOSIS — F439 Reaction to severe stress, unspecified: Secondary | ICD-10-CM

## 2024-01-31 DIAGNOSIS — J4520 Mild intermittent asthma, uncomplicated: Secondary | ICD-10-CM

## 2024-01-31 DIAGNOSIS — G4733 Obstructive sleep apnea (adult) (pediatric): Secondary | ICD-10-CM

## 2024-01-31 DIAGNOSIS — E538 Deficiency of other specified B group vitamins: Secondary | ICD-10-CM

## 2024-01-31 DIAGNOSIS — K219 Gastro-esophageal reflux disease without esophagitis: Secondary | ICD-10-CM

## 2024-01-31 DIAGNOSIS — Z9109 Other allergy status, other than to drugs and biological substances: Secondary | ICD-10-CM

## 2024-01-31 HISTORY — DX: Pure hypercholesterolemia, unspecified: E78.00

## 2024-01-31 NOTE — Progress Notes (Signed)
 Subjective:    Patient ID: Angela Pacheco, female    DOB: 1966-08-23, 58 y.o.   MRN: [id number]  Patient here for  Chief Complaint  Patient presents with   Medical Management of Chronic Issues    HPI Here for a scheduled follow up - follow up regarding hypothyroidism, increased stress, GERD and OSA. She has adjusted her diet. Doing weight watchers. Walking/free weights. Increased water intake. Has lost weight. Has done well with diet and exercise. Continuing B12 injections. Can tell when due next B12 dose. More sluggish. Discussed checking B12 level today. Breathing stable. Acid reflux better. Has otc prilosec if needed. Breathing stable.    Past Medical History:  Diagnosis Date   Allergy     cats trigger   Arthritis June 2022   Asthma    mild   B12 deficiency 05/06/2019   Complication of anesthesia    Dysplastic nevus 11/01/2017   Right middle shoulder, mod, excision by Dr Arlyss   Dysplastic nevus 07/03/2020   L medial scapular back, lat to T5, moderate, excision by Dr Arlyss   Dysplastic nevus 06/09/2020   L scapular back lateral, mild   Hypercholesteremia 01/31/2024   Hypothyroidism    Oxygen deficiency 2023   Use mouth sleep appliance. Respire   Sleep apnea 2023   Thyroid  disease    Past Surgical History:  Procedure Laterality Date   ABDOMINAL HYSTERECTOMY     cervix left in place   ANTERIOR CRUCIATE LIGAMENT REPAIR Left 2017   APPENDECTOMY  12/27/1989   CHOLECYSTECTOMY     COLONOSCOPY WITH PROPOFOL  N/A 10/08/2019   Procedure: COLONOSCOPY WITH PROPOFOL ;  Surgeon: Unk Corinn Skiff, MD;  Location: [facility] END[facility]COPY;  Service: Gastroenterology;  Laterality: N/A;   Family History  Problem Relation Age of Onset   Arthritis Mother    Hearing loss Mother    Cancer Father    Heart disease Father    Arthritis Sister    Hearing loss Sister    Asthma Daughter    Heart disease Brother    Breast cancer Neg Hx    Social History   Socioeconomic History    Marital status: Married    Spouse name: Not on file   Number of children: Not on file   Years of education: Not on file   Highest education level: Bachelor's degree (e.g., BA, AB, BS)  Occupational History   Not on file  Tobacco Use   Smoking status: Never   Smokeless tobacco: Never  Substance and Sexual Activity   Alcohol use: Yes    Comment: Seltzer maybe 1-2 a week   Drug use: No   Sexual activity: Yes    Birth control/protection: Post-menopausal    Comment: N/A  Other Topics Concern   Not on file  Social History Narrative   Not on file   Social Drivers of Health   Financial Resource Strain: Low Risk  (01/31/2024)   Overall Financial Resource Strain (CARDIA)    Difficulty of Paying Living Expenses: Not hard at all  Food Insecurity: No Food Insecurity (01/31/2024)   Hunger Vital Sign    Worried About Running Out of Food in the Last Year: Never true    Ran Out of Food in the Last Year: Never true  Transportation Needs: No Transportation Needs (01/31/2024)   PRAPARE - Administrator, Civil Service (Medical): No    Lack of Transportation (Non-Medical): No  Physical Activity: Insufficiently Active (01/31/2024)   Exercise Vital Sign  Days of Exercise per Week: 3 days    Minutes of Exercise per Session: 30 min  Stress: No Stress Concern Present (01/31/2024)   [employer] of Occupational Health - Occupational Stress Questionnaire    Feeling of Stress : Only a little  Social Connections: Socially Integrated (01/31/2024)   Social Connection and Isolation Panel [NHANES]    Frequency of Communication with Friends and Family: More than three times a week    Frequency of Social Gatherings with Friends and Family: Once a week    Attends Religious Services: More than 4 times per year    Active Member of [employer] or Organizations: Yes    Attends Engineer, Structural: More than 4 times per year    Marital Status: Married     Review of Systems  Constitutional:   Negative for appetite change.       Has adjusted diet. Exercising. Lost weight.   HENT:  Negative for congestion and sinus pressure.   Respiratory:  Negative for cough, chest tightness and shortness of breath.   Cardiovascular:  Negative for chest pain and palpitations.  Gastrointestinal:  Negative for abdominal pain, diarrhea, nausea and vomiting.  Genitourinary:  Negative for difficulty urinating and dysuria.  Musculoskeletal:  Negative for joint swelling and myalgias.  Skin:  Negative for color change and rash.  Neurological:  Negative for dizziness and headaches.  Psychiatric/Behavioral:  Negative for agitation and dysphoric mood.        Objective:     BP 106/70   Pulse 64   Temp 98.2 F (36.8 C)   Resp 16   Ht 5' (1.524 m)   Wt 122 lb 3.2 oz (55.4 kg)   SpO2 97%   BMI 23.87 kg/m  Wt Readings from Last 3 Encounters:  01/31/24 122 lb 3.2 oz (55.4 kg)  10/14/23 130 lb 12.8 oz (59.3 kg)  08/01/23 140 lb 6.4 oz (63.7 kg)    Physical Exam Vitals reviewed.  Constitutional:      General: She is not in acute distress.    Appearance: Normal appearance.  HENT:     Head: Normocephalic and atraumatic.     Right Ear: External ear normal.     Left Ear: External ear normal.     Mouth/Throat:     Pharynx: No oropharyngeal exudate or posterior oropharyngeal erythema.  Eyes:     General: No scleral icterus.       Right eye: No discharge.        Left eye: No discharge.     Conjunctiva/sclera: Conjunctivae normal.  Neck:     Thyroid : No thyromegaly.  Cardiovascular:     Rate and Rhythm: Normal rate and regular rhythm.  Pulmonary:     Effort: No respiratory distress.     Breath sounds: Normal breath sounds. No wheezing.  Abdominal:     General: Bowel sounds are normal.     Palpations: Abdomen is soft.     Tenderness: There is no abdominal tenderness.  Musculoskeletal:        General: No swelling or tenderness.     Cervical back: Neck supple. No tenderness.   Lymphadenopathy:     Cervical: No cervical adenopathy.  Skin:    Findings: No erythema or rash.  Neurological:     Mental Status: She is alert.  Psychiatric:        Mood and Affect: Mood normal.        Behavior: Behavior normal.  Outpatient Encounter Medications as of 01/31/2024  Medication Sig   albuterol  (VENTOLIN  HFA) 108 (90 Base) MCG/ACT inhaler INHALE 2 PUFFS INTO THE LUNGS EVERY 6 HOURS AS NEEDED FOR WHEEZING OR SHORTNESS OF BREATH   Calcium Carbonate-Vitamin D (CALTRATE 600+D PO) Take 1 tablet by mouth 2 (two) times daily.   cyanocobalamin  (VITAMIN B12) 1000 MCG/ML injection INJECT 1 ML IN THE MUSCLE EVERY 30 DAYS   estradiol  (ESTRACE ) 0.1 MG/GM vaginal cream Place 1 Applicatorful vaginally once a week.   fluconazole  (DIFLUCAN ) 150 MG tablet Take one tablet x 1.  May repeat x 1 in three days if symptoms persist   levothyroxine  (SYNTHROID ) 100 MCG tablet TAKE 1 TABLET(100 MCG) BY MOUTH DAILY   Magnesium 400 MG CAPS Take 1 tablet by mouth daily at 6 (six) AM.   sucralfate (CARAFATE) 1 g tablet Take 1 g by mouth 2 (two) times daily.   SYRINGE-NEEDLE, DISP, 3 ML (BD INTEGRA SYRINGE) 25G X 1 3 ML MISC Use as directed with B12 injections   Triamcinolone Acetonide (NASACORT AQ NA) Place into the nose.   vitamin C (ASCORBIC ACID) 500 MG tablet Take 500 mg by mouth daily.   [DISCONTINUED] cefdinir  (OMNICEF ) 300 MG capsule Take 1 capsule (300 mg total) by mouth 2 (two) times daily.   [DISCONTINUED] pantoprazole  (PROTONIX ) 40 MG tablet Take 1 tablet (40 mg total) by mouth daily.   [DISCONTINUED] predniSONE  (DELTASONE ) 10 MG tablet Take 4 tablets x 1 day and then decrease by 1/2 tablet per day until down to zero mg.   No facility-administered encounter medications on file as of 01/31/2024.     Lab Results  Component Value Date   WBC 7.1 08/01/2023   HGB 15.0 08/01/2023   HCT 44.7 08/01/2023   PLT 347.0 08/01/2023   GLUCOSE 81 01/31/2024   CHOL 177 01/31/2024   TRIG 67.0  01/31/2024   HDL 59.20 01/31/2024   LDLCALC 105 (H) 01/31/2024   ALT 18 01/31/2024   AST 19 01/31/2024   NA 142 01/31/2024   K 3.9 01/31/2024   CL 106 01/31/2024   CREATININE 0.87 01/31/2024   BUN 20 01/31/2024   CO2 23 01/31/2024   TSH 4.19 01/31/2024    MM 3D SCREENING MAMMOGRAM BILATERAL BREAST Result Date: 07/26/2023 CLINICAL DATA:  Screening. EXAM: DIGITAL SCREENING BILATERAL MAMMOGRAM WITH TOMOSYNTHESIS AND CAD TECHNIQUE: Bilateral screening digital craniocaudal and mediolateral oblique mammograms were obtained. Bilateral screening digital breast tomosynthesis was performed. The images were evaluated with computer-aided detection. COMPARISON:  Previous exam(s). ACR Breast Density Category b: There are scattered areas of fibroglandular density. FINDINGS: There are no findings suspicious for malignancy. IMPRESSION: No mammographic evidence of malignancy. A result letter of this screening mammogram will be mailed directly to the patient. RECOMMENDATION: Screening mammogram in one year. (Code:SM-B-01Y) BI-RADS CATEGORY  1: Negative. Electronically Signed   By: Delon Music M.D.   On: 07/26/2023 14:24       Assessment & Plan:  Hypothyroidism, unspecified type Assessment & Plan: On thyroid  replacement.  Follow tsh.   Orders: -     TSH  Hypercholesteremia Assessment & Plan: Low cholesterol diet and exercise.  Follow lipid panel. Has adjusted diet. Lost weight.   Orders: -     Basic metabolic panel -     Hepatic function panel -     Lipid panel  B12 deficiency Assessment & Plan: Taking b12 injections currently. Will check B12 today. Can tell when due injection.   Orders: -  Vitamin B12  Stress Assessment & Plan: Appears to be handling things well. Follow.    OSA (obstructive sleep apnea) Assessment & Plan: Has seen ENT. Oral appliance.  Continue.  Follow.    Gastroesophageal reflux disease, unspecified whether esophagitis present Assessment & Plan: Doing  well since adjust diet and lost weight.    Environmental allergies Assessment & Plan: Antihistamine, steroid nasal spray and saline - as needed.    Mild intermittent asthma without complication Assessment & Plan: Breathing stable.       Allena Hamilton, MD

## 2024-02-01 ENCOUNTER — Ambulatory Visit: Payer: BC Managed Care – PPO | Admitting: Internal Medicine

## 2024-02-01 LAB — LIPID PANEL
Cholesterol: 177 mg/dL (ref 0–200)
HDL: 59.2 mg/dL (ref >39.00)
LDL Cholesterol: 105 mg/dL — ABNORMAL HIGH (ref 0–99)
NonHDL: 117.96
Total CHOL/HDL Ratio: 3
Triglycerides: 67 mg/dL (ref 0.0–149.0)
VLDL: 13.4 mg/dL (ref 0.0–40.0)

## 2024-02-01 LAB — VITAMIN B12: Vitamin B-12: 578 pg/mL (ref 211–911)

## 2024-02-01 LAB — HEPATIC FUNCTION PANEL
ALT: 18 U/L (ref 0–35)
AST: 19 U/L (ref 0–37)
Albumin: 4.3 g/dL (ref 3.5–5.2)
Alkaline Phosphatase: 59 U/L (ref 39–117)
Bilirubin, Direct: 0.1 mg/dL (ref 0.0–0.3)
Total Bilirubin: 0.5 mg/dL (ref 0.2–1.2)
Total Protein: 6.9 g/dL (ref 6.0–8.3)

## 2024-02-01 LAB — BASIC METABOLIC PANEL
BUN: 20 mg/dL (ref 6–23)
CO2: 23 meq/L (ref 19–32)
Calcium: 9.1 mg/dL (ref 8.4–10.5)
Chloride: 106 meq/L (ref 96–112)
Creatinine, Ser: 0.87 mg/dL (ref 0.40–1.20)
GFR: 74.76 mL/min (ref >60.00)
Glucose, Bld: 81 mg/dL (ref 70–99)
Potassium: 3.9 meq/L (ref 3.5–5.1)
Sodium: 142 meq/L (ref 135–145)

## 2024-02-01 LAB — TSH: TSH: 4.19 u[IU]/mL (ref 0.35–5.50)

## 2024-02-02 ENCOUNTER — Encounter: Payer: Self-pay | Admitting: Internal Medicine

## 2024-02-05 ENCOUNTER — Encounter: Payer: Self-pay | Admitting: Internal Medicine

## 2024-02-05 NOTE — Assessment & Plan Note (Signed)
 Taking b12 injections currently. Will check B12 today. Can tell when due injection.

## 2024-02-05 NOTE — Assessment & Plan Note (Signed)
 Low cholesterol diet and exercise.  Follow lipid panel. Has adjusted diet. Lost weight.

## 2024-02-05 NOTE — Assessment & Plan Note (Signed)
 Breathing stable.

## 2024-02-05 NOTE — Assessment & Plan Note (Signed)
Appears to be handling things well.  Follow.  

## 2024-02-05 NOTE — Assessment & Plan Note (Signed)
Has seen ENT. Oral appliance.  Continue.  Follow.

## 2024-02-05 NOTE — Assessment & Plan Note (Signed)
 On thyroid replacement.  Follow tsh.

## 2024-02-05 NOTE — Assessment & Plan Note (Signed)
 Antihistamine, steroid nasal spray and saline - as needed.

## 2024-02-05 NOTE — Assessment & Plan Note (Signed)
 Doing well since adjust diet and lost weight.

## 2024-03-05 ENCOUNTER — Encounter: Payer: Self-pay | Admitting: Internal Medicine

## 2024-03-06 ENCOUNTER — Other Ambulatory Visit: Payer: Self-pay

## 2024-03-06 NOTE — Progress Notes (Signed)
 Entered in error

## 2024-03-06 NOTE — Telephone Encounter (Signed)
 Notify - ok to change to B12 injection q month

## 2024-03-06 NOTE — Telephone Encounter (Signed)
 Ok to start back on monthly b12 injections?

## 2024-04-16 ENCOUNTER — Encounter: Payer: Self-pay | Admitting: Internal Medicine

## 2024-04-16 NOTE — Telephone Encounter (Signed)
 Noted.

## 2024-04-19 ENCOUNTER — Ambulatory Visit: Admitting: Internal Medicine

## 2024-04-20 ENCOUNTER — Telehealth (INDEPENDENT_AMBULATORY_CARE_PROVIDER_SITE_OTHER): Admitting: Internal Medicine

## 2024-04-20 VITALS — Ht 60.0 in | Wt 116.0 lb

## 2024-04-20 DIAGNOSIS — M25511 Pain in right shoulder: Secondary | ICD-10-CM | POA: Diagnosis not present

## 2024-04-20 DIAGNOSIS — H9190 Unspecified hearing loss, unspecified ear: Secondary | ICD-10-CM | POA: Diagnosis not present

## 2024-04-20 NOTE — Progress Notes (Signed)
 Patient ID: Angela Pacheco, female   DOB: February 10, 1966, 58 y.o.   MRN: [id number]   Virtual Visit via video Note  I connected with Terral Ferrari by a video enabled telemedicine application and verified that I am speaking with the correct person using two identifiers. Location patient: home Location provider: work Persons participating in the virtual visit: patient, provider  The limitations, risks, security and privacy concerns of performing an evaluation and management service by video and the availability of in person appointments have been discussed. It has also been discussed with the patient that there may be a patient responsible charge related to this service. The patient expressed understanding and agreed to proceed.   Reason for visit: work in appt  HPI: Work in with concerns regarding pain - shoulder/ribs and back. Also request a referral to ENT for hearing evaluation. States that approximately one month ago, she was working outside. Noticed some increased symptoms the following day. Noticed pain - right mid scapula. Pain right side/scapula/ribs - right shoulder. Has been taking tylenol. Applying ice and heat. Has been stretching. Using TENS unit. Standing and walking helps. Persistent problems. Discussed referral to ortho, given persistence and given not responding to above measures. Also request referral for hearing evaluation.    ROS: See pertinent positives and negatives per HPI.  Past Medical History:  Diagnosis Date   Allergy     cats trigger   Arthritis June 2022   Asthma    mild   B12 deficiency 05/06/2019   Complication of anesthesia    Dysplastic nevus 11/01/2017   Right middle shoulder, mod, excision by Dr Tyrone Gallop   Dysplastic nevus 07/03/2020   L medial scapular back, lat to T5, moderate, excision by Dr Tyrone Gallop   Dysplastic nevus 06/09/2020   L scapular back lateral, mild   Hypercholesteremia 01/31/2024   Hypothyroidism    Oxygen deficiency 2023   Use  mouth sleep appliance. Respire   Sleep apnea 2023   Thyroid  disease     Past Surgical History:  Procedure Laterality Date   ABDOMINAL HYSTERECTOMY     cervix left in place   ANTERIOR CRUCIATE LIGAMENT REPAIR Left 2017   APPENDECTOMY  12/27/1989   CHOLECYSTECTOMY     COLONOSCOPY WITH PROPOFOL  N/A 10/08/2019   Procedure: COLONOSCOPY WITH PROPOFOL ;  Surgeon: Selena Daily, MD;  Location: [facility];  Service: Gastroenterology;  Laterality: N/A;    Family History  Problem Relation Age of Onset   Arthritis Mother    Hearing loss Mother    Cancer Father    Heart disease Father    Arthritis Sister    Hearing loss Sister    Asthma Daughter    Heart disease Brother    Breast cancer Neg Hx     SOCIAL HX: reviewed.    Current Outpatient Medications:    albuterol  (VENTOLIN  HFA) 108 (90 Base) MCG/ACT inhaler, INHALE 2 PUFFS INTO THE LUNGS EVERY 6 HOURS AS NEEDED FOR WHEEZING OR SHORTNESS OF BREATH, Disp: 18 g, Rfl: 1   Calcium Carbonate-Vitamin D (CALTRATE 600+D PO), Take 1 tablet by mouth 2 (two) times daily., Disp: , Rfl:    cyanocobalamin  (VITAMIN B12) 1000 MCG/ML injection, INJECT 1 ML IN THE MUSCLE EVERY 30 DAYS, Disp: 10 mL, Rfl: 0   estradiol (ESTRACE) 0.1 MG/GM vaginal cream, Place 1 Applicatorful vaginally once a week., Disp: , Rfl:    fluconazole  (DIFLUCAN ) 150 MG tablet, Take one tablet x 1.  May repeat x 1 in three days if symptoms  persist, Disp: 2 tablet, Rfl: 0   levothyroxine  (SYNTHROID ) 100 MCG tablet, TAKE 1 TABLET(100 MCG) BY MOUTH DAILY, Disp: 30 tablet, Rfl: 5   Magnesium 400 MG CAPS, Take 1 tablet by mouth daily at 6 (six) AM., Disp: , Rfl:    sucralfate (CARAFATE) 1 g tablet, Take 1 g by mouth 2 (two) times daily., Disp: , Rfl:    SYRINGE-NEEDLE, DISP, 3 ML (BD INTEGRA SYRINGE) 25G X 1" 3 ML MISC, Use as directed with B12 injections, Disp: 100 each, Rfl: 0   Triamcinolone Acetonide (NASACORT AQ NA), Place into the nose., Disp: , Rfl:    vitamin C  (ASCORBIC ACID) 500 MG tablet, Take 500 mg by mouth daily., Disp: , Rfl:   EXAM:  GENERAL: alert, oriented, appears well and in no acute distress  HEENT: atraumatic, conjunttiva clear, no obvious abnormalities on inspection of external nose and ears  NECK: normal movements of the head and neck. Does report increased discomfort with looking to the right.   LUNGS: on inspection no signs of respiratory distress, breathing rate appears normal, no obvious gross SOB, gasping or wheezing  CV: no obvious cyanosis  MS: looking up and to the right - increased pulling/pain sensation.   PSYCH/NEURO: pleasant and cooperative, no obvious depression or anxiety, speech and thought processing grossly intact  ASSESSMENT AND PLAN:  Discussed the following assessment and plan:  Problem List Items Addressed This Visit     Decreased hearing   Has seen ENT previously. 02/2022 - audiogram (sensorineural hearing loss). Request referral to ENT for hearing reevaluation.       Relevant Orders   Ambulatory referral to ENT   Right shoulder pain - Primary   S/p surgery. Having increased pain - right scapular/back/shoulder. Has tried tylenol, ice, heat, stretching and exercise and TENS. Still persistent problems. Discussed ortho referral given not responding to conservative measures.       Relevant Orders   Ambulatory referral to Orthopedic Surgery    Return if symptoms worsen or fail to improve.   I discussed the assessment and treatment plan with the patient. The patient was provided an opportunity to ask questions and all were answered. The patient agreed with the plan and demonstrated an understanding of the instructions.   The patient was advised to call back or seek an in-person evaluation if the symptoms worsen or if the condition fails to improve as anticipated.    Dellar Fenton, MD

## 2024-04-22 ENCOUNTER — Encounter: Payer: Self-pay | Admitting: Internal Medicine

## 2024-04-22 NOTE — Assessment & Plan Note (Signed)
 S/p surgery. Having increased pain - right scapular/back/shoulder. Has tried tylenol, ice, heat, stretching and exercise and TENS. Still persistent problems. Discussed ortho referral given not responding to conservative measures.

## 2024-04-22 NOTE — Assessment & Plan Note (Signed)
 Has seen ENT previously. 02/2022 - audiogram (sensorineural hearing loss). Request referral to ENT for hearing reevaluation.

## 2024-04-23 ENCOUNTER — Ambulatory Visit: Admitting: Internal Medicine

## 2024-06-21 ENCOUNTER — Other Ambulatory Visit: Payer: Self-pay | Admitting: Internal Medicine

## 2024-06-21 DIAGNOSIS — Z1231 Encounter for screening mammogram for malignant neoplasm of breast: Secondary | ICD-10-CM

## 2024-07-02 ENCOUNTER — Encounter: Payer: Self-pay | Admitting: Internal Medicine

## 2024-07-11 ENCOUNTER — Encounter: Payer: Self-pay | Admitting: Internal Medicine

## 2024-07-11 DIAGNOSIS — M2550 Pain in unspecified joint: Secondary | ICD-10-CM

## 2024-07-12 NOTE — Telephone Encounter (Signed)
 Please call her and thank her for the update. I am ok with a referral to [facility] or [facility] - just let me know which one she prefers.

## 2024-07-13 NOTE — Telephone Encounter (Signed)
 Discussed with patient . Referral placed to [facility].

## 2024-07-19 ENCOUNTER — Encounter: Payer: Self-pay | Admitting: Internal Medicine

## 2024-07-19 DIAGNOSIS — M2550 Pain in unspecified joint: Secondary | ICD-10-CM

## 2024-07-19 NOTE — Telephone Encounter (Signed)
 Copied from CRM #[id number]. Topic: Clinical - Medical Advice >> Jul 19, 2024 12:58 PM Harlene ORN wrote: Reason for CRM:  for sueanne, RN ws helping her with a referral to Rheumatology. please call the patient back about the referral.  Phone: [phone]

## 2024-07-20 NOTE — Telephone Encounter (Signed)
 Ok with referral to [facility]

## 2024-07-20 NOTE — Telephone Encounter (Signed)
 Order placed for rheumatology referral - [facility].

## 2024-07-20 NOTE — Telephone Encounter (Signed)
 See other note

## 2024-07-20 NOTE — Telephone Encounter (Signed)
 Noted

## 2024-07-20 NOTE — Telephone Encounter (Signed)
 [facility] denied referral. Ok to send to [facility]?

## 2024-07-24 ENCOUNTER — Ambulatory Visit: Payer: BC Managed Care – PPO | Admitting: Dermatology

## 2024-07-24 ENCOUNTER — Encounter: Payer: Self-pay | Admitting: Dermatology

## 2024-07-24 DIAGNOSIS — L578 Other skin changes due to chronic exposure to nonionizing radiation: Secondary | ICD-10-CM

## 2024-07-24 DIAGNOSIS — L814 Other melanin hyperpigmentation: Secondary | ICD-10-CM

## 2024-07-24 DIAGNOSIS — Z808 Family history of malignant neoplasm of other organs or systems: Secondary | ICD-10-CM | POA: Diagnosis not present

## 2024-07-24 DIAGNOSIS — D239 Other benign neoplasm of skin, unspecified: Secondary | ICD-10-CM

## 2024-07-24 DIAGNOSIS — D229 Melanocytic nevi, unspecified: Secondary | ICD-10-CM

## 2024-07-24 DIAGNOSIS — Z1283 Encounter for screening for malignant neoplasm of skin: Secondary | ICD-10-CM | POA: Diagnosis not present

## 2024-07-24 DIAGNOSIS — L821 Other seborrheic keratosis: Secondary | ICD-10-CM

## 2024-07-24 DIAGNOSIS — D1801 Hemangioma of skin and subcutaneous tissue: Secondary | ICD-10-CM

## 2024-07-24 DIAGNOSIS — Z86018 Personal history of other benign neoplasm: Secondary | ICD-10-CM

## 2024-07-24 DIAGNOSIS — W908XXA Exposure to other nonionizing radiation, initial encounter: Secondary | ICD-10-CM

## 2024-07-24 DIAGNOSIS — D2262 Melanocytic nevi of left upper limb, including shoulder: Secondary | ICD-10-CM

## 2024-07-24 NOTE — Patient Instructions (Signed)

## 2024-07-24 NOTE — Telephone Encounter (Signed)
 Patient following up on Rheumatology referral.

## 2024-07-24 NOTE — Progress Notes (Signed)
 Follow-Up Visit   Subjective  Angela Pacheco is a 58 y.o. female who presents for the following: Skin Cancer Screening and Full Body Skin Exam. Hx of dysplastic nevi. Family Hx of MM, father.   States she has developed allergy  to sunscreens. Started a couple of years ago. Causes hives. States mineral sunscreens are worse. Blue Lizard caused hives while in [place] .   The patient presents for Total-Body Skin Exam (TBSE) for skin cancer screening and mole check. The patient has spots, moles and lesions to be evaluated, some may be new or changing and the patient may have concern these could be cancer.  Hx of autoimmune thyroiditis. Hx of + ANA. No Dx of lupus. C/O redness and pain at joints. Does have referral to rheumatology.   The following portions of the chart were reviewed this encounter and updated as appropriate: medications, allergies, medical history  Review of Systems:  No other skin or systemic complaints except as noted in HPI or Assessment and Plan.  Objective  Well appearing patient in no apparent distress; mood and affect are within normal limits.  A full examination was performed including scalp, head, eyes, ears, nose, lips, neck, chest, axillae, abdomen, back, buttocks, bilateral upper extremities, bilateral lower extremities, hands, feet, fingers, toes, fingernails, and toenails. All findings within normal limits unless otherwise noted below.   Relevant physical exam findings are noted in the Assessment and Plan.  Exam of nails limited by presence of nail polish.    Assessment & Plan   SKIN CANCER SCREENING PERFORMED TODAY.  HISTORY OF DYSPLASTIC NEVI No evidence of recurrence today Recommend regular full body skin exams Recommend daily broad spectrum sunscreen SPF 30+ to sun-exposed areas, reapply every 2 hours as needed.  Call if any new or changing lesions are noted between office visits  FAMILY HISTORY OF SKIN CANCER What type(s): Melanoma Who  affected: Father  ACTINIC DAMAGE - Chronic condition, secondary to cumulative UV/sun exposure - diffuse scaly erythematous macules with underlying dyspigmentation - Recommend daily broad spectrum sunscreen SPF 30+ to sun-exposed areas, reapply every 2 hours as needed.  - Staying in the shade or wearing long sleeves, sun glasses (UVA+UVB protection) and wide brim hats (4-inch brim around the entire circumference of the hat) are also recommended for sun protection.  - Call for new or changing lesions.  LENTIGINES, SEBORRHEIC KERATOSES, HEMANGIOMAS - Benign normal skin lesions - Benign-appearing - Call for any changes  MELANOCYTIC NEVI - Tan-brown and/or pink-flesh-colored symmetric macules and papules - 5 x 3 mm medium dark brown macule with darker center and tail at right shoulder stable when compared to photo. - tan-brown macules and papules at back, base of neck/hairline  - tan-brown macule at medial left index finger - Benign-appearing. Stable compared to previous visit/photos. Observation.  Call clinic for new or changing moles.  Recommend daily use of broad spectrum spf 30+ sunscreen to sun-exposed areas.    Lentigo vs nevus Exam: Left palm 3 mm light tan macule  Benign-appearing. Stable compared to previous visit. Observation.  Call clinic for new or changing moles.  Recommend daily use of broad spectrum spf 30+ sunscreen to sun-exposed areas.    DERMATOFIBROMA Exam: Firm pink/brown papulenodule with dimple sign at left posterior shoulder and left lower pretibia/anterior ankle. Treatment Plan: A dermatofibroma is a benign growth possibly related to trauma, such as an insect bite, cut from shaving, or inflamed acne-type bump.  Treatment options to remove include shave or excision with resulting scar and  risk of recurrence.  Since benign-appearing and not bothersome, will observe for now.     Return in about 1 year (around 07/24/2025) for TBSE, HxDN.  I, Jill Parcell, CMA, am  acting as scribe for Rexene Rattler, MD.   Documentation: I have reviewed the above documentation for accuracy and completeness, and I agree with the above.  Rexene Rattler, MD

## 2024-07-25 ENCOUNTER — Ambulatory Visit
Admission: RE | Admit: 2024-07-25 | Discharge: 2024-07-25 | Disposition: A | Discharge status: Home / Self Care | Source: Ambulatory Visit | Attending: Internal Medicine | Admitting: Internal Medicine

## 2024-07-25 DIAGNOSIS — Z1231 Encounter for screening mammogram for malignant neoplasm of breast: Secondary | ICD-10-CM | POA: Diagnosis present

## 2024-07-27 ENCOUNTER — Ambulatory Visit: Payer: Self-pay | Admitting: Internal Medicine

## 2024-07-27 ENCOUNTER — Other Ambulatory Visit: Payer: Self-pay | Admitting: Internal Medicine

## 2024-07-27 DIAGNOSIS — R928 Other abnormal and inconclusive findings on diagnostic imaging of breast: Secondary | ICD-10-CM

## 2024-08-01 ENCOUNTER — Ambulatory Visit (INDEPENDENT_AMBULATORY_CARE_PROVIDER_SITE_OTHER): Payer: 59 | Admitting: Internal Medicine

## 2024-08-01 ENCOUNTER — Encounter: Payer: Self-pay | Admitting: Internal Medicine

## 2024-08-01 VITALS — BP 110/78 | HR 71 | Temp 98.7°F | Resp 20 | Ht 59.75 in | Wt 118.2 lb

## 2024-08-01 DIAGNOSIS — E7800 Pure hypercholesterolemia, unspecified: Secondary | ICD-10-CM | POA: Diagnosis not present

## 2024-08-01 DIAGNOSIS — Z0000 Encounter for general adult medical examination without abnormal findings: Secondary | ICD-10-CM

## 2024-08-01 DIAGNOSIS — M2550 Pain in unspecified joint: Secondary | ICD-10-CM

## 2024-08-01 DIAGNOSIS — F439 Reaction to severe stress, unspecified: Secondary | ICD-10-CM

## 2024-08-01 DIAGNOSIS — E039 Hypothyroidism, unspecified: Secondary | ICD-10-CM | POA: Diagnosis not present

## 2024-08-01 DIAGNOSIS — G4733 Obstructive sleep apnea (adult) (pediatric): Secondary | ICD-10-CM

## 2024-08-01 LAB — BASIC METABOLIC PANEL WITH GFR
BUN: 20 mg/dL (ref 6–23)
CO2: 29 meq/L (ref 19–32)
Calcium: 9.7 mg/dL (ref 8.4–10.5)
Chloride: 102 meq/L (ref 96–112)
Creatinine, Ser: 0.9 mg/dL (ref 0.40–1.20)
GFR: 71.53 mL/min (ref >60.00)
Glucose, Bld: 98 mg/dL (ref 70–99)
Potassium: 4 meq/L (ref 3.5–5.1)
Sodium: 139 meq/L (ref 135–145)

## 2024-08-01 LAB — HEPATIC FUNCTION PANEL
ALT: 21 U/L (ref 0–35)
AST: 15 U/L (ref 0–37)
Albumin: 4.3 g/dL (ref 3.5–5.2)
Alkaline Phosphatase: 50 U/L (ref 39–117)
Bilirubin, Direct: 0.1 mg/dL (ref 0.0–0.3)
Total Bilirubin: 0.6 mg/dL (ref 0.2–1.2)
Total Protein: 7.1 g/dL (ref 6.0–8.3)

## 2024-08-01 LAB — CBC WITH DIFFERENTIAL/PLATELET
Basophils Absolute: 0 K/uL (ref 0.0–0.1)
Basophils Relative: 0.3 % (ref 0.0–3.0)
Eosinophils Absolute: 0.1 K/uL (ref 0.0–0.7)
Eosinophils Relative: 0.6 % (ref 0.0–5.0)
HCT: 43.8 % (ref 36.0–46.0)
Hemoglobin: 14.8 g/dL (ref 12.0–15.0)
Lymphocytes Relative: 20 % (ref 12.0–46.0)
Lymphs Abs: 1.9 K/uL (ref 0.7–4.0)
MCHC: 33.8 g/dL (ref 30.0–36.0)
MCV: 90.6 fl (ref 78.0–100.0)
Monocytes Absolute: 0.5 K/uL (ref 0.1–1.0)
Monocytes Relative: 4.9 % (ref 3.0–12.0)
Neutro Abs: 7 K/uL (ref 1.4–7.7)
Neutrophils Relative %: 74.2 % (ref 43.0–77.0)
Platelets: 338 K/uL (ref 150.0–400.0)
RBC: 4.83 Mil/uL (ref 3.87–5.11)
RDW: 13 % (ref 11.5–15.5)
WBC: 9.4 K/uL (ref 4.0–10.5)

## 2024-08-01 LAB — LIPID PANEL
Cholesterol: 208 mg/dL — ABNORMAL HIGH (ref 0–200)
HDL: 64.4 mg/dL (ref >39.00)
LDL Cholesterol: 124 mg/dL — ABNORMAL HIGH (ref 0–99)
NonHDL: 143.92
Total CHOL/HDL Ratio: 3
Triglycerides: 99 mg/dL (ref 0.0–149.0)
VLDL: 19.8 mg/dL (ref 0.0–40.0)

## 2024-08-01 LAB — TSH: TSH: 1.67 u[IU]/mL (ref 0.35–5.50)

## 2024-08-01 NOTE — Progress Notes (Signed)
 Subjective:    Patient ID: Angela Pacheco, female    DOB: 07/13/1966, 58 y.o.   MRN: [id number]  Patient here for  Chief Complaint  Patient presents with   Annual Exam    HPI Here for a physical exam. Last visit, decreased hearing. Referred to ENT. Also - right shoulder pain. Saw ortho 06/08/24 - recommended PT. Saw ortho 07/25/24 - joint pain and persistent neck/shoulder pain. Described - hurts to write. Unable to open jars. Limits her activity. Recommended refer back to PT. Placed on prednisone . With persistent fatigue and joint pains. Prednisone  taper did help. Labs ordered - rheum panel. Lab work unrevealing. Has appt with rheumatology 09/13/24. Also scheduled to have f/u views mammogram this week.    Past Medical History:  Diagnosis Date   Allergy     cats trigger   Arthritis June 2022   Asthma Years ago   mild   B12 deficiency 05/06/2019   Complication of anesthesia    Dysplastic nevus 11/01/2017   Right middle shoulder, mod, excision by Dr Arlyss   Dysplastic nevus 07/03/2020   L medial scapular back, lat to T5, moderate, excision by Dr Arlyss   Dysplastic nevus 06/09/2020   L scapular back lateral, mild   Hypercholesteremia 01/31/2024   Hypothyroidism    Oxygen deficiency 2023   Use mouth sleep appliance. Respire   Sleep apnea 2023   Thyroid  disease 1998   Past Surgical History:  Procedure Laterality Date   ABDOMINAL HYSTERECTOMY     cervix left in place   ANTERIOR CRUCIATE LIGAMENT REPAIR Left 2017   APPENDECTOMY  1991   BREAST BIOPSY Left 08/03/2024   US  LT BREAST BX W LOC DEV 1ST LESION IMG BX SPEC US  GUIDE 08/03/2024 [facility]   CHOLECYSTECTOMY     COLONOSCOPY WITH PROPOFOL  N/A 10/08/2019   Procedure: COLONOSCOPY WITH PROPOFOL ;  Surgeon: Unk Corinn Skiff, MD;  Location: [facility];  Service: Gastroenterology;  Laterality: N/A;   Family History  Problem Relation Age of Onset   Arthritis Mother    Hearing loss Mother    Cancer Father     Heart disease Father    Arthritis Sister    Hearing loss Sister    Asthma Daughter    Heart disease Brother    Clotting disorder Brother    Breast cancer Neg Hx    Social History   Socioeconomic History   Marital status: Married    Spouse name: Not on file   Number of children: Not on file   Years of education: Not on file   Highest education level: Bachelor's degree (e.g., BA, AB, BS)  Occupational History   Not on file  Tobacco Use   Smoking status: Never   Smokeless tobacco: Never  Substance and Sexual Activity   Alcohol use: Yes    Comment: Seltzer maybe 1-2 a week   Drug use: No   Sexual activity: Yes    Birth control/protection: Post-menopausal    Comment: N/A  Other Topics Concern   Not on file  Social History Narrative   Not on file   Social Drivers of Health   Financial Resource Strain: Low Risk  (06/08/2024)   Received from [facility]   Overall Financial Resource Strain (CARDIA)    Difficulty of Paying Living Expenses: Not hard at all  Food Insecurity: No Food Insecurity (06/08/2024)   [facility] from [facility]   Hunger Vital Sign    Within the past 12 months,  you worried that your food would run out before you got the money to buy more.: Never true    Within the past 12 months, the food you bought just didn't last and you didn't have money to get more.: Never true  Transportation Needs: No Transportation Needs (06/08/2024)   Received from [facility] - [facility]    In the past 12 months, has lack of transportation kept you from medical appointments or from getting medications?: No    Lack of Transportation (Non-Medical): No  Physical Activity: Insufficiently Active (01/31/2024)   Exercise Vital Sign    Days of Exercise per Week: 3 days    Minutes of Exercise per Session: 30 min  Stress: No Stress Concern Present (01/31/2024)   [employer] of Occupational Health - Occupational Stress  Questionnaire    Feeling of Stress : Only a little  Social Connections: Socially Integrated (01/31/2024)   Social Connection and Isolation Panel    Frequency of Communication with Friends and Family: More than three times a week    Frequency of Social Gatherings with Friends and Family: Once a week    Attends Religious Services: More than 4 times per year    Active Member of [employer] or Organizations: Yes    Attends Engineer, structural: More than 4 times per year    Marital Status: Married     Review of Systems  Constitutional:  Negative for appetite change and unexpected weight change.  HENT:  Negative for congestion, sinus pressure and sore throat.   Eyes:  Negative for pain and visual disturbance.  Respiratory:  Negative for cough, chest tightness and shortness of breath.   Cardiovascular:  Negative for chest pain, palpitations and leg swelling.  Gastrointestinal:  Negative for abdominal pain, diarrhea, nausea and vomiting.  Genitourinary:  Negative for difficulty urinating and dysuria.  Musculoskeletal:  Positive for neck pain.       Joint pains as outlined.   Skin:  Negative for color change and rash.  Neurological:  Negative for dizziness and headaches.  Hematological:  Negative for adenopathy. Does not bruise/bleed easily.  Psychiatric/Behavioral:  Negative for agitation and dysphoric mood.        Objective:     BP 110/78   Pulse 71   Temp 98.7 F (37.1 C)   Resp 20   Ht 4' 11.75 (1.518 m)   Wt 118 lb 4 oz (53.6 kg)   SpO2 99%   BMI 23.29 kg/m  Wt Readings from Last 3 Encounters:  08/01/24 118 lb 4 oz (53.6 kg)  04/20/24 116 lb (52.6 kg)  01/31/24 122 lb 3.2 oz (55.4 kg)    Physical Exam Vitals reviewed.  Constitutional:      General: She is not in acute distress.    Appearance: Normal appearance.  HENT:     Head: Normocephalic and atraumatic.     Right Ear: External ear normal.     Left Ear: External ear normal.     Mouth/Throat:     Pharynx:  No oropharyngeal exudate or posterior oropharyngeal erythema.  Eyes:     General: No scleral icterus.       Right eye: No discharge.        Left eye: No discharge.     Conjunctiva/sclera: Conjunctivae normal.  Neck:     Thyroid : No thyromegaly.  Cardiovascular:     Rate and Rhythm: Normal rate and regular rhythm.  Pulmonary:     Effort: No  respiratory distress.     Breath sounds: Normal breath sounds. No wheezing.  Abdominal:     General: Bowel sounds are normal.     Palpations: Abdomen is soft.     Tenderness: There is no abdominal tenderness.  Musculoskeletal:        General: No swelling or tenderness.     Cervical back: Neck supple. No tenderness.  Lymphadenopathy:     Cervical: No cervical adenopathy.  Skin:    Findings: No erythema or rash.  Neurological:     Mental Status: She is alert.  Psychiatric:        Mood and Affect: Mood normal.        Behavior: Behavior normal.         Outpatient Encounter Medications as of 08/01/2024  Medication Sig   albuterol  (VENTOLIN  HFA) 108 (90 Base) MCG/ACT inhaler INHALE 2 PUFFS INTO THE LUNGS EVERY 6 HOURS AS NEEDED FOR WHEEZING OR SHORTNESS OF BREATH   Calcium Carbonate-Vitamin D (CALTRATE 600+D PO) Take 1 tablet by mouth 2 (two) times daily.   cyanocobalamin  (VITAMIN B12) 1000 MCG/ML injection INJECT 1 ML IN THE MUSCLE EVERY 30 DAYS   estradiol  (ESTRACE ) 0.1 MG/GM vaginal cream Place 1 Applicatorful vaginally once a week.   levothyroxine  (SYNTHROID ) 100 MCG tablet TAKE 1 TABLET(100 MCG) BY MOUTH DAILY   Magnesium 400 MG CAPS Take 1 tablet by mouth daily at 6 (six) AM.   SYRINGE-NEEDLE, DISP, 3 ML (BD INTEGRA SYRINGE) 25G X 1 3 ML MISC Use as directed with B12 injections   Triamcinolone Acetonide (NASACORT AQ NA) Place into the nose.   vitamin C (ASCORBIC ACID) 500 MG tablet Take 500 mg by mouth daily.   [DISCONTINUED] fluconazole  (DIFLUCAN ) 150 MG tablet Take one tablet x 1.  May repeat x 1 in three days if symptoms persist    [DISCONTINUED] sucralfate (CARAFATE) 1 g tablet Take 1 g by mouth 2 (two) times daily.   No facility-administered encounter medications on file as of 08/01/2024.     Lab Results  Component Value Date   WBC 9.4 08/01/2024   HGB 14.8 08/01/2024   HCT 43.8 08/01/2024   PLT 338.0 08/01/2024   GLUCOSE 98 08/01/2024   CHOL 208 (H) 08/01/2024   TRIG 99.0 08/01/2024   HDL 64.40 08/01/2024   LDLCALC 124 (H) 08/01/2024   ALT 21 08/01/2024   AST 15 08/01/2024   NA 139 08/01/2024   K 4.0 08/01/2024   CL 102 08/01/2024   CREATININE 0.90 08/01/2024   BUN 20 08/01/2024   CO2 29 08/01/2024   TSH 1.67 08/01/2024    MM 3D SCREENING MAMMOGRAM BILATERAL BREAST Result Date: 07/27/2024 CLINICAL DATA:  Screening. EXAM: DIGITAL SCREENING BILATERAL MAMMOGRAM WITH TOMOSYNTHESIS AND CAD TECHNIQUE: Bilateral screening digital craniocaudal and mediolateral oblique mammograms were obtained. Bilateral screening digital breast tomosynthesis was performed. The images were evaluated with computer-aided detection. COMPARISON:  Previous exam(s). ACR Breast Density Category b: There are scattered areas of fibroglandular density. FINDINGS: In the left breast, possible distortion and a separate possible asymmetry warrants further evaluation. In the right breast, no findings suspicious for malignancy. IMPRESSION: Further evaluation is suggested for possible distortion and a possible asymmetry in the left breast. RECOMMENDATION: Diagnostic mammogram and possibly ultrasound of the left breast. (Code:[id number]) The patient will be contacted regarding the findings, and additional imaging will be scheduled. BI-RADS CATEGORY  0: Incomplete: Need additional imaging evaluation. Electronically Signed   By: Dirk Arrant M.D.   On: 07/27/2024 09:29  Assessment & Plan:  Hypercholesteremia Assessment & Plan: Low cholesterol diet and exercise. Follow lipid panel.   Orders: -     CBC with Differential/Platelet -     Basic  metabolic panel with GFR -     Hepatic function panel -     Lipid panel  Hypothyroidism, unspecified type Assessment & Plan: On thyroi replacement. Check tsh.   Orders: -     TSH  Health care maintenance Assessment & Plan: Physical today 08/01/24.   Mammogram 07/25/24 - Birads0. Recommended f/u views.   Colonoscopy 09/2019 - one polyp.  Recommended f/u in 7 years.  PAP gyn 08/11/22 - negative with negative HPV.    Stress Assessment & Plan: Increased stress related to current symptoms. Follow. Planning PT and f/u with rheumatology.    OSA (obstructive sleep apnea) Assessment & Plan: Has seen ENT. Oral appliance. Needs to continue. Follow.    Arthralgia, unspecified joint Assessment & Plan: Symptoms as outlined. Prednisone  did help. Labs unrevealing. Saw ortho. Has appt with rheumatology 09/13/24.       Allena Hamilton, MD

## 2024-08-01 NOTE — Assessment & Plan Note (Signed)
 Physical today 08/01/24.   Mammogram 07/25/24 - Birads0. Recommended f/u views.   Colonoscopy 09/2019 - one polyp.  Recommended f/u in 7 years.  PAP gyn 08/11/22 - negative with negative HPV.

## 2024-08-02 ENCOUNTER — Ambulatory Visit
Admission: RE | Admit: 2024-08-02 | Discharge: 2024-08-02 | Disposition: A | Discharge status: Home / Self Care | Source: Ambulatory Visit | Attending: Internal Medicine | Admitting: Internal Medicine

## 2024-08-02 ENCOUNTER — Ambulatory Visit: Payer: Self-pay | Admitting: Internal Medicine

## 2024-08-02 DIAGNOSIS — R928 Other abnormal and inconclusive findings on diagnostic imaging of breast: Secondary | ICD-10-CM | POA: Insufficient documentation

## 2024-08-03 ENCOUNTER — Ambulatory Visit
Admission: RE | Admit: 2024-08-03 | Discharge: 2024-08-03 | Disposition: A | Discharge status: Home / Self Care | Source: Ambulatory Visit | Attending: Internal Medicine | Admitting: Internal Medicine

## 2024-08-03 DIAGNOSIS — C50412 Malignant neoplasm of upper-outer quadrant of left female breast: Secondary | ICD-10-CM | POA: Insufficient documentation

## 2024-08-03 DIAGNOSIS — R928 Other abnormal and inconclusive findings on diagnostic imaging of breast: Secondary | ICD-10-CM

## 2024-08-03 HISTORY — PX: BREAST BIOPSY: SHX20

## 2024-08-03 MED ORDER — LIDOCAINE-EPINEPHRINE 1 %-1:100000 IJ SOLN
8.0000 mL | Freq: Once | INTRAMUSCULAR | Status: AC
  Administered 2024-08-03: 8 mL
  Filled 2024-08-03: qty 8

## 2024-08-03 MED ORDER — LIDOCAINE 1 % OPTIME INJ - NO CHARGE
2.0000 mL | Freq: Once | INTRAMUSCULAR | Status: AC
  Administered 2024-08-03: 2 mL
  Filled 2024-08-03: qty 2

## 2024-08-04 ENCOUNTER — Ambulatory Visit: Payer: Self-pay | Admitting: Internal Medicine

## 2024-08-05 ENCOUNTER — Encounter: Payer: Self-pay | Admitting: Internal Medicine

## 2024-08-05 NOTE — Assessment & Plan Note (Signed)
 Has seen ENT. Oral appliance. Needs to continue. Follow.

## 2024-08-05 NOTE — Assessment & Plan Note (Signed)
 Symptoms as outlined. Prednisone  did help. Labs unrevealing. Saw ortho. Has appt with rheumatology 09/13/24.

## 2024-08-05 NOTE — Assessment & Plan Note (Signed)
 On thyroi replacement. Check tsh.

## 2024-08-05 NOTE — Assessment & Plan Note (Signed)
 Increased stress related to current symptoms. Follow. Planning PT and f/u with rheumatology.

## 2024-08-05 NOTE — Assessment & Plan Note (Signed)
 Low cholesterol diet and exercise.  Follow lipid panel.

## 2024-08-06 ENCOUNTER — Encounter: Payer: Self-pay | Admitting: *Deleted

## 2024-08-06 DIAGNOSIS — C50912 Malignant neoplasm of unspecified site of left female breast: Secondary | ICD-10-CM

## 2024-08-06 LAB — SURGICAL PATHOLOGY

## 2024-08-06 NOTE — Progress Notes (Signed)
 Received referral for newly diagnosed breast cancer from [facility].  Navigation initiated.  Referral placed for [facility], their office will call with the appt.   She will see Dr. Melanee on Monday 8/18 at 10:45.

## 2024-08-07 ENCOUNTER — Ambulatory Visit: Payer: Self-pay | Admitting: Internal Medicine

## 2024-08-08 ENCOUNTER — Telehealth: Payer: Self-pay | Admitting: Internal Medicine

## 2024-08-08 NOTE — Telephone Encounter (Signed)
 My chart message sent about rheumatology appt.

## 2024-08-13 ENCOUNTER — Encounter: Payer: Self-pay | Admitting: *Deleted

## 2024-08-13 ENCOUNTER — Inpatient Hospital Stay

## 2024-08-13 ENCOUNTER — Other Ambulatory Visit: Payer: Self-pay | Admitting: Surgery

## 2024-08-13 ENCOUNTER — Encounter: Payer: Self-pay | Admitting: Oncology

## 2024-08-13 ENCOUNTER — Other Ambulatory Visit: Payer: Self-pay

## 2024-08-13 ENCOUNTER — Encounter: Payer: Self-pay | Admitting: Surgery

## 2024-08-13 ENCOUNTER — Ambulatory Visit: Payer: Self-pay | Admitting: Surgery

## 2024-08-13 ENCOUNTER — Inpatient Hospital Stay: Attending: Oncology | Admitting: Oncology

## 2024-08-13 VITALS — BP 118/75 | HR 84 | Temp 98.6°F | Ht 60.0 in | Wt 117.2 lb

## 2024-08-13 VITALS — BP 118/93 | HR 71 | Temp 97.0°F | Resp 18 | Ht 59.0 in | Wt 117.3 lb

## 2024-08-13 DIAGNOSIS — C50412 Malignant neoplasm of upper-outer quadrant of left female breast: Secondary | ICD-10-CM | POA: Diagnosis not present

## 2024-08-13 DIAGNOSIS — D171 Benign lipomatous neoplasm of skin and subcutaneous tissue of trunk: Secondary | ICD-10-CM | POA: Diagnosis not present

## 2024-08-13 DIAGNOSIS — Z803 Family history of malignant neoplasm of breast: Secondary | ICD-10-CM | POA: Insufficient documentation

## 2024-08-13 DIAGNOSIS — Z90710 Acquired absence of both cervix and uterus: Secondary | ICD-10-CM | POA: Diagnosis not present

## 2024-08-13 DIAGNOSIS — Z170 Estrogen receptor positive status [ER+]: Secondary | ICD-10-CM

## 2024-08-13 DIAGNOSIS — C50912 Malignant neoplasm of unspecified site of left female breast: Secondary | ICD-10-CM

## 2024-08-13 DIAGNOSIS — Z808 Family history of malignant neoplasm of other organs or systems: Secondary | ICD-10-CM | POA: Diagnosis not present

## 2024-08-13 DIAGNOSIS — Z5189 Encounter for other specified aftercare: Secondary | ICD-10-CM | POA: Diagnosis not present

## 2024-08-13 DIAGNOSIS — Z7189 Other specified counseling: Secondary | ICD-10-CM | POA: Diagnosis not present

## 2024-08-13 DIAGNOSIS — Z800 Family history of malignant neoplasm of digestive organs: Secondary | ICD-10-CM | POA: Insufficient documentation

## 2024-08-13 HISTORY — DX: Estrogen receptor positive status (ER+): Z17.0

## 2024-08-13 NOTE — H&P (View-Only) (Signed)
 08/13/2024  Reason for Visit:  Left breast cancer  Requesting Provider:  Allena Hamilton, MD  History of Present Illness: Angela Pacheco is a 57 y.o. female presenting for evaluation of newly diagnosed left breast cancer.  The patient had her routine screening mammogram on 07/25/24 which showed an area of distortion in the left breast.  Diagnostic mammogram and ultrasound on 08/02/24 showed a 5 x 5 x 4 mm mass in the left breast, 1:30 position, 6 CMFN.  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  Prior to her mammograms, she did not feel any masses or noted any skin changes.  Prior mammograms had noted a different area of stable distortion since 2020.  She has a history of breast cancer in her paternal cousin.  Menses started at age 85.  She has had two pregnancies, first at age 48.  Has taken birth control in the past.  She had a hysterectomy at age 7.  She has met with Dr. Melanee earlier today.  Separately, the patient also reports having a small mass in the lateral aspect of her right chest wall and is wondering if that is a lipoma.  Past Medical History: Past Medical History:  Diagnosis Date   Allergy     cats trigger   Arthritis June 2022   Asthma Years ago   mild   B12 deficiency 05/06/2019   Complication of anesthesia    Dysplastic nevus 11/01/2017   Right middle shoulder, mod, excision by Dr Arlyss   Dysplastic nevus 07/03/2020   L medial scapular back, lat to T5, moderate, excision by Dr Arlyss   Dysplastic nevus 06/09/2020   L scapular back lateral, mild   Hypercholesteremia 01/31/2024   Hypothyroidism    Oxygen deficiency 2023   Use mouth sleep appliance. Respire   Sleep apnea 2023   Thyroid  disease 1998     Past Surgical History: Past Surgical History:  Procedure Laterality Date   ABDOMINAL HYSTERECTOMY     cervix left in place   ANTERIOR CRUCIATE LIGAMENT REPAIR Left 2017   APPENDECTOMY  1991   BREAST BIOPSY  Left 08/03/2024   US  LT BREAST BX W LOC DEV 1ST LESION IMG BX SPEC US  GUIDE 08/03/2024 [facility]   CHOLECYSTECTOMY     COLONOSCOPY WITH PROPOFOL  N/A 10/08/2019   Procedure: COLONOSCOPY WITH PROPOFOL ;  Surgeon: Unk Corinn Skiff, MD;  Location: [facility];  Service: Gastroenterology;  Laterality: N/A;    Home Medications: Prior to Admission medications   Medication Sig Start Date End Date Taking? Authorizing Provider  albuterol  (VENTOLIN  HFA) 108 (90 Base) MCG/ACT inhaler INHALE 2 PUFFS INTO THE LUNGS EVERY 6 HOURS AS NEEDED FOR WHEEZING OR SHORTNESS OF BREATH 04/09/22   Hamilton Allena, MD  Calcium Carbonate-Vitamin D (CALTRATE 600+D PO) Take 1 tablet by mouth 2 (two) times daily.    [provider]  cyanocobalamin  (VITAMIN B12) 1000 MCG/ML injection INJECT 1 ML IN THE MUSCLE EVERY 30 DAYS 12/14/23   Hamilton Allena, MD  estradiol  (ESTRACE ) 0.1 MG/GM vaginal cream Place 1 Applicatorful vaginally once a week. Patient not taking: Reported on 08/13/2024 03/18/23   [provider]  levothyroxine  (SYNTHROID ) 100 MCG tablet TAKE 1 TABLET(100 MCG) BY MOUTH DAILY 09/29/23   Hamilton Allena, MD  Magnesium 400 MG CAPS Take 1 tablet by mouth daily at 6 (six) AM.    [provider]  omega-3 acid ethyl esters (LOVAZA) 1 g capsule Take by mouth daily.  [provider]  SYRINGE-NEEDLE, DISP, 3 ML (BD INTEGRA SYRINGE) 25G X 1 3 ML MISC Use as directed with B12 injections 12/14/23   Glendia Shad, MD  Triamcinolone Acetonide (NASACORT AQ NA) Place into the nose.    [provider]  vitamin C (ASCORBIC ACID) 500 MG tablet Take 500 mg by mouth daily.    [provider]    Allergies: Allergies  Allergen Reactions   Wound Dressing Adhesive Rash    BAND-AIDS    Social History:  reports that she has never smoked. She has never used smokeless tobacco. She reports current alcohol use. She reports that she does not use drugs.   Family  History: Family History  Problem Relation Age of Onset   Arthritis Mother    Hearing loss Mother    Cancer Father        Stage 4 Melanoma   Heart disease Father    Arthritis Sister    Hearing loss Sister    Heart disease Brother    Clotting disorder Brother    Colon cancer Maternal Grandmother    Colon cancer Maternal Grandfather    Asthma Daughter    Autoimmune disease Daughter    Breast cancer Paternal Cousin    Colon cancer Maternal Uncle        in polyp    Review of Systems: Review of Systems  Constitutional:  Negative for chills and fever.  Respiratory:  Negative for shortness of breath.   Cardiovascular:  Negative for chest pain.  Gastrointestinal:  Negative for nausea and vomiting.  Genitourinary:  Negative for dysuria.  Musculoskeletal:  Negative for myalgias.  Skin:  Negative for rash.  Neurological:  Negative for dizziness.  Psychiatric/Behavioral:  Negative for depression.     Physical Exam BP 118/75   Pulse 84   Temp 98.6 F (37 C) (Oral)   Ht 5' (1.524 m)   Wt 117 lb 3.2 oz (53.2 kg)   SpO2 96%   BMI 22.89 kg/m  CONSTITUTIONAL: No acute distress, well nourished. HEENT:  Normocephalic, atraumatic, extraocular motion intact. NECK: Trachea is midline, and there is no jugular venous distension.  RESPIRATORY:  Lungs are clear, and breath sounds are equal bilaterally. Normal respiratory effort without pathologic use of accessory muscles. CARDIOVASCULAR: Heart is regular without murmurs, gallops, or rubs. BREAST:  Left breast with healing biopsy site and resolving minimal ecchymosis.  No palpable masses, other skin changes, or nipple changes.  No left axillary lymphadenopathy.  Right breast without any palpable masses, skin changes, or nipple changes.  No right axillary lymphadenopathy. MUSCULOSKELETAL:  Normal muscle strength and tone in all four extremities.  No peripheral edema or cyanosis. SKIN: The patient has a 2 cm lipoma in the right lateral chest  wall.  It is soft, mobile, non-tender, without any erythema. NEUROLOGIC:  Motor and sensation is grossly normal.  Cranial nerves are grossly intact. PSYCH:  Alert and oriented to person, place and time. Affect is normal.  Laboratory Analysis: Labs from 08/01/24: Na 139, K 4, Cl 102, CO2 29, BUN 20, Cr 0.9.  LFTs within normal.  WBC 9.4, Hgb 14.8, Hct 43.8, Plt 338.  Left breast biopsy on 08/03/24: FINAL DIAGNOSIS       1. Breast, left, needle core biopsy, 1:30 6cmfn, coil :       - INVASIVE DUCTAL CARCINOMA, SEE NOTE       - DUCTAL CARCINOMA IN SITU, INTERMEDIATE GRADE       - TUBULE FORMATION: SCORE  1       - NUCLEAR PLEOMORPHISM: SCORE 2       - MITOTIC COUNT: SCORE 1       - TOTAL SCORE: 4       - OVERALL GRADE: 1       - LYMPHOVASCULAR INVASION: NOT IDENTIFIED       - CANCER LENGTH: 0.5 CM       - CALCIFICATIONS: NOT IDENTIFIED       - OTHER FINDINGS: NONE   Imaging: Mammogram and U/S on 08/02/24: FINDINGS: Spot compression tomosynthesis views demonstrate a persistent area of architectural distortion in the LEFT upper outer breast at middle depth. It is best seen on ML slice 47, spot CC slice 26 and spot MLO volume 1 slice 39. A second questioned asymmetry resolves with additional views and assumes a configuration stable dating back to at least 2020, consistent with a benign etiology.   On physical exam, no discrete mass is appreciated.   Targeted ultrasound was performed LEFT upper outer breast. At 1:30 6 cm from nipple there is an irregular hypoechoic mass with irregular margins. This measures 5 x 5 x 4 mm. This corresponds well to the site of mammographic concern.   Targeted ultrasound was performed of the LEFT axilla. No suspicious axillary lymph nodes are visualized.   IMPRESSION: 1. There is a suspicious 5 mm mass at the site of mammographic concern with associated architectural distortion. Recommend ultrasound-guided biopsy for definitive characterization.  2. No suspicious  LEFT axillary adenopathy.   RECOMMENDATION: LEFT breast ultrasound-guided biopsy x1   I have discussed the findings and recommendations with the patient. The biopsy procedure was discussed with the patient and questions were answered. Patient expressed their understanding of the biopsy recommendation. Patient will be scheduled for biopsy at her earliest convenience by the schedulers. Ordering provider will be notified. If applicable, a reminder letter will be sent to the patient regarding the next appointment.   BI-RADS CATEGORY  4: Suspicious.  Assessment and Plan: This is a 58 y.o. female with newly diagnosed left breast cancer and right chest wall lipoma.  --Discussed with the patient the findings on her imaging and biopsy.  Discussed with her the possible treatment options including lumpectomy followed by radiation vs mastectomy, the possibility for reconstruction, that either option includes a sentinel lymph node biopsy to evaluate for any possible spread of her cancer.  Discussed the post-op recovery for both, and she has opted to proceed with lumpectomy. --Discussed with her the the plan for a left breast tag localized lumpectomy and sentinel lymph node biopsy and excision of right chest wall lipoma.  Reviewed the surgery at length with her including the preoperative SAVI tag placement, planned incisions, use of blue dye and radiotracer to evaluate the sentinel lymph nodes, risks of bleeding, infection, injury to surrounding structures, lymphedema, that this would be an outpatient surgery, post-operative use of a breast binder, activity restrictions, pain control, and she's willing to proceed. --Will schedule her for surgery on 08/23/24.  All of her questions have been answered.  I spent 55 minutes dedicated to the care of this patient on the date of this encounter to include pre-visit review of records, face-to-face time with the patient discussing diagnosis and management, and any post-visit  coordination of care.   Aloysius Sheree Plant, MD [facility]

## 2024-08-13 NOTE — Progress Notes (Signed)
 Hematology/Oncology Consult note [facility] [phone] [phone]  Patient Care Team: Glendia Shad, MD as PCP - General (Internal Medicine) Georgina Shasta POUR, RN as Oncology Nurse Navigator   Name of the patient: Angela Pacheco  [id number]  09/15/1966    Reason for referral-new diagnosis of breast cancer   Referring physician-Dr. Shad Glendia  Date of visit: 08/13/24   History of presenting illness-patient is a 58 yr old Female with a past medical history significant for hypothyroidism who underwent a routine screening mammogram in July 2025 which showed possible abnormality in her left breast.  This was followed by diagnostic mammogram and an ultrasound which showed 5 x 5 x 4 mm irregular hypoechoic mass in the left upper outer breast at the 1:30 position 6 cm from the nipple.  No suspicious left axillary lymph nodes.  This was biopsied and was consistent with invasive mammary carcinoma grade 1 ER 95% positive, PR negative and HER2 negative +1.  Patient will be meeting with Dr. Desiderio today to discuss lumpectomy and possible sentinel lymph node biopsy.  Family history significant for breast cancer in her paternal cousin.  Dad had melanoma and maternal grandparents had colon cancer.  Menarche at the age of 71.  G2, P2.  Age at first birth 71.  She has taken birth control in the past.  She underwent hysterectomy at the age of 84 for endometriosis but still has ovaries in situ.  ECOG PS- 0  Pain scale- 0   Review of systems- Review of Systems  Constitutional:  Negative for chills, fever, malaise/fatigue and weight loss.  HENT:  Negative for congestion, ear discharge and nosebleeds.   Eyes:  Negative for blurred vision.  Respiratory:  Negative for cough, hemoptysis, sputum production, shortness of breath and wheezing.   Cardiovascular:  Negative for chest pain, palpitations, orthopnea and claudication.  Gastrointestinal:  Negative for  abdominal pain, blood in stool, constipation, diarrhea, heartburn, melena, nausea and vomiting.  Genitourinary:  Negative for dysuria, flank pain, frequency, hematuria and urgency.  Musculoskeletal:  Negative for back pain, joint pain and myalgias.  Skin:  Negative for rash.  Neurological:  Negative for dizziness, tingling, focal weakness, seizures, weakness and headaches.  Endo/Heme/Allergies:  Does not bruise/bleed easily.  Psychiatric/Behavioral:  Negative for depression and suicidal ideas. The patient does not have insomnia.     Allergies  Allergen Reactions   Wound Dressing Adhesive Rash    BAND-AIDS    Patient Active Problem List   Diagnosis Date Noted   Hypercholesteremia 01/31/2024   Overactive bladder 10/31/2022   Abnormal liver function test 06/22/2022   OSA (obstructive sleep apnea) 05/06/2022   Dizziness 04/18/2022   Daytime sleepiness 03/15/2022   Epigastric pain 02/26/2022   Decreased hearing 01/24/2022   Fatigue 01/24/2022   GERD (gastroesophageal reflux disease) 01/24/2022   Status post shoulder surgery 11/24/2021   Stress 10/11/2021   Right foot pain 04/03/2021   Impingement syndrome of left shoulder region 09/22/2020   Tingling in extremities 09/14/2020   Axillary fullness 06/18/2020   Right shoulder pain 01/01/2020   B12 deficiency 05/06/2019   Neck fullness 01/20/2019   Family history of lupus erythematosus 10/04/2018   Positive ANA (antinuclear antibody) 10/04/2018   Joint pain 09/15/2018   Immunity to measles determined by serologic test 06/30/2018   Abnormal mammogram 07/07/2016   Health care maintenance 04/05/2015   Environmental allergies 01/17/2015   Asthma 01/17/2015   Menopausal symptoms 01/17/2015   Endometriosis 01/17/2015   Hypothyroidism  01/14/2015     Past Medical History:  Diagnosis Date   Allergy     cats trigger   Arthritis June 2022   Asthma Years ago   mild   B12 deficiency 05/06/2019   Complication of anesthesia     Dysplastic nevus 11/01/2017   Right middle shoulder, mod, excision by Dr Arlyss   Dysplastic nevus 07/03/2020   L medial scapular back, lat to T5, moderate, excision by Dr Arlyss   Dysplastic nevus 06/09/2020   L scapular back lateral, mild   Hypercholesteremia 01/31/2024   Hypothyroidism    Oxygen deficiency 2023   Use mouth sleep appliance. Respire   Sleep apnea 2023   Thyroid  disease 1998     Past Surgical History:  Procedure Laterality Date   ABDOMINAL HYSTERECTOMY     cervix left in place   ANTERIOR CRUCIATE LIGAMENT REPAIR Left 2017   APPENDECTOMY  1991   BREAST BIOPSY Left 08/03/2024   US  LT BREAST BX W LOC DEV 1ST LESION IMG BX SPEC US  GUIDE 08/03/2024 [facility]   CHOLECYSTECTOMY     COLONOSCOPY WITH PROPOFOL  N/A 10/08/2019   Procedure: COLONOSCOPY WITH PROPOFOL ;  Surgeon: Unk Corinn Skiff, MD;  Location: [facility];  Service: Gastroenterology;  Laterality: N/A;    Social History   Socioeconomic History   Marital status: Married    Spouse name: Franky   Number of children: 1   Years of education: Not on file   Highest education level: Bachelor's degree (e.g., BA, AB, BS)  Occupational History   Not on file  Tobacco Use   Smoking status: Never   Smokeless tobacco: Never  Vaping Use   Vaping status: Never Used  Substance and Sexual Activity   Alcohol use: Yes    Comment: Seltzer maybe 1-2 a week   Drug use: No   Sexual activity: Yes    Birth control/protection: Post-menopausal    Comment: N/A  Other Topics Concern   Not on file  Social History Narrative   Not on file   Social Drivers of Health   Financial Resource Strain: Low Risk  (06/08/2024)   Received from [facility]   Overall Financial Resource Strain (CARDIA)    Difficulty of Paying Living Expenses: Not hard at all  Food Insecurity: No Food Insecurity (08/13/2024)   Hunger Vital Sign    Worried About Running Out of Food in the Last Year: Never true    Ran Out of  Food in the Last Year: Never true  Transportation Needs: No Transportation Needs (08/13/2024)   PRAPARE - Administrator, Civil Service (Medical): No    Lack of Transportation (Non-Medical): No  Physical Activity: Insufficiently Active (01/31/2024)   Exercise Vital Sign    Days of Exercise per Week: 3 days    Minutes of Exercise per Session: 30 min  Stress: No Stress Concern Present (01/31/2024)   [employer] of Occupational Health - Occupational Stress Questionnaire    Feeling of Stress : Only a little  Social Connections: Socially Integrated (01/31/2024)   Social Connection and Isolation Panel    Frequency of Communication with Friends and Family: More than three times a week    Frequency of Social Gatherings with Friends and Family: Once a week    Attends Religious Services: More than 4 times per year    Active Member of [employer] or Organizations: Yes    Attends Banker Meetings: More than 4 times per year    Marital  Status: Married  Catering manager Violence: Not At Risk (08/13/2024)   Humiliation, Afraid, Rape, and Kick questionnaire    Fear of Current or Ex-Partner: No    Emotionally Abused: No    Physically Abused: No    Sexually Abused: No     Family History  Problem Relation Age of Onset   Arthritis Mother    Hearing loss Mother    Cancer Father        Stage 4 Melanoma   Heart disease Father    Arthritis Sister    Hearing loss Sister    Heart disease Brother    Clotting disorder Brother    Colon cancer Maternal Grandmother    Colon cancer Maternal Grandfather    Asthma Daughter    Autoimmune disease Daughter    Breast cancer Paternal Cousin    Colon cancer Maternal Uncle        in polyp     Current Outpatient Medications:    albuterol  (VENTOLIN  HFA) 108 (90 Base) MCG/ACT inhaler, INHALE 2 PUFFS INTO THE LUNGS EVERY 6 HOURS AS NEEDED FOR WHEEZING OR SHORTNESS OF BREATH, Disp: 18 g, Rfl: 1   Calcium Carbonate-Vitamin D (CALTRATE 600+D PO),  Take 1 tablet by mouth 2 (two) times daily., Disp: , Rfl:    cyanocobalamin  (VITAMIN B12) 1000 MCG/ML injection, INJECT 1 ML IN THE MUSCLE EVERY 30 DAYS, Disp: 10 mL, Rfl: 0   levothyroxine  (SYNTHROID ) 100 MCG tablet, TAKE 1 TABLET(100 MCG) BY MOUTH DAILY, Disp: 30 tablet, Rfl: 5   Magnesium 400 MG CAPS, Take 1 tablet by mouth daily at 6 (six) AM., Disp: , Rfl:    omega-3 acid ethyl esters (LOVAZA) 1 g capsule, Take by mouth daily., Disp: , Rfl:    SYRINGE-NEEDLE, DISP, 3 ML (BD INTEGRA SYRINGE) 25G X 1 3 ML MISC, Use as directed with B12 injections, Disp: 100 each, Rfl: 0   Triamcinolone Acetonide (NASACORT AQ NA), Place into the nose., Disp: , Rfl:    vitamin C (ASCORBIC ACID) 500 MG tablet, Take 500 mg by mouth daily., Disp: , Rfl:    Physical exam:  Vitals:   08/13/24 1056  BP: (!) 118/93  Pulse: 71  Resp: 18  Temp: (!) 97 F (36.1 C)  TempSrc: Tympanic  SpO2: 100%  Weight: 117 lb 4.8 oz (53.2 kg)  Height: 4' 11 (1.499 m)   Physical Exam Cardiovascular:     Rate and Rhythm: Normal rate and regular rhythm.     Heart sounds: Normal heart sounds.  Pulmonary:     Effort: Pulmonary effort is normal.     Breath sounds: Normal breath sounds.  Abdominal:     General: Bowel sounds are normal.     Palpations: Abdomen is soft.  Skin:    General: Skin is warm and dry.  Neurological:     Mental Status: She is alert and oriented to person, place, and time.           Latest Ref Rng & Units 08/01/2024   10:50 AM  CMP  Glucose 70 - 99 mg/dL 98   BUN 6 - 23 mg/dL 20   Creatinine 9.59 - 1.20 mg/dL 9.09   Sodium 864 - 854 mEq/L 139   Potassium 3.5 - 5.1 mEq/L 4.0   Chloride 96 - 112 mEq/L 102   CO2 19 - 32 mEq/L 29   Calcium 8.4 - 10.5 mg/dL 9.7   Total Protein 6.0 - 8.3 g/dL 7.1   Total Bilirubin 0.2 -  1.2 mg/dL 0.6   Alkaline Phos 39 - 117 U/L 50   AST 0 - 37 U/L 15   ALT 0 - 35 U/L 21       Latest Ref Rng & Units 08/01/2024   10:50 AM  CBC  WBC 4.0 - 10.5 K/uL 9.4    Hemoglobin 12.0 - 15.0 g/dL 85.1   Hematocrit 63.9 - 46.0 % 43.8   Platelets 150.0 - 400.0 K/uL 338.0     No images are attached to the encounter.  US  LT BREAST BX W LOC DEV 1ST LESION IMG BX SPEC US  GUIDE Addendum Date: 08/06/2024 ADDENDUM REPORT: 08/06/2024 14:54 ADDENDUM: PATHOLOGY revealed: 1. Breast, left, needle core biopsy, 1:30 6 cmfn, coil : - INVASIVE DUCTAL CARCINOMA, SEE NOTE - DUCTAL CARCINOMA IN SITU, INTERMEDIATE GRADE - OVERALL GRADE: 1 - LYMPHOVASCULAR INVASION: NOT IDENTIFIED - CANCER LENGTH: 0.5 CM - CALCIFICATIONS: NOT IDENTIFIED. Pathology results are CONCORDANT with imaging findings, per Dr. Corean Salter. Pathology results and recommendations were discussed with patient via telephone on 08/06/2024 by Rock Hover RN. Patient reported biopsy site doing well with no adverse symptoms, and slight tenderness at the site. Post biopsy care instructions were reviewed, questions were answered and my direct phone number was provided. Patient was instructed to call [facility] for any additional questions or concerns related to biopsy site. RECOMMENDATIONS: 1. Surgical and oncological consultation. Request for surgical and oncological consultation relayed to Shasta Ada RN at [facility] by Rock Hover RN on 08/06/2024. 2. Consider pretreatment bilateral breast MRI with and without contrast to evaluate extent of breast disease. This addendum is signed by Dr. Norleen Croak in Dr. Kerin absence. Pathology results reported by Rock Hover RN on 08/06/2024. Electronically Signed   By: Norleen Croak M.D.   On: 08/06/2024 14:54   Result Date: 08/06/2024 CLINICAL DATA:  Indeterminate architectural distortion with associated mass EXAM: ULTRASOUND GUIDED LEFT BREAST CORE NEEDLE BIOPSY COMPARISON:  Previous exam(s). PROCEDURE: I met with the patient and we discussed the procedure of ultrasound-guided biopsy, including benefits and alternatives. We discussed the high likelihood  of a successful procedure. We discussed the risks of the procedure, including infection, bleeding, tissue injury, clip migration, and inadequate sampling. Informed written consent was given. The usual time-out protocol was performed immediately prior to the procedure. Lesion quadrant: Upper outer quadrant Using sterile technique and 1% lidocaine  and 1% lidocaine  with epinephrine  as local anesthetic, under direct ultrasound visualization, a 14 gauge spring-loaded device was used to perform biopsy of a mass at 1:30 6 cm from the nipple using a inferolateral approach. At the conclusion of the procedure a COIL shaped tissue marker clip was deployed into the biopsy cavity. Follow up 2 view mammogram was performed and dictated separately. IMPRESSION: Ultrasound guided biopsy of a LEFT breast mass. No apparent complications. Electronically Signed: By: Corean Salter M.D. On: 08/03/2024 10:05   MM CLIP PLACEMENT LEFT Result Date: 08/03/2024 CLINICAL DATA:  Status post ultrasound-guided biopsy EXAM: 3D DIAGNOSTIC LEFT MAMMOGRAM POST ULTRASOUND BIOPSY COMPARISON:  Previous exam(s). ACR Breast Density Category b: There are scattered areas of fibroglandular density. FINDINGS: 3D Mammographic images were obtained following ultrasound guided biopsy of a LEFT breast mass. The COIL biopsy marking clip is in expected position at the site of biopsy. This is at the site of architectural distortion concern. IMPRESSION: Appropriate positioning of the COIL shaped biopsy marking clip at the site of biopsy in the upper outer breast. Final Assessment: Post Procedure Mammograms for  Marker Placement Electronically Signed   By: Corean Salter M.D.   On: 08/03/2024 10:04   MM 3D DIAGNOSTIC MAMMOGRAM UNILATERAL LEFT BREAST Result Date: 08/02/2024 CLINICAL DATA:  LEFT breast callback EXAM: DIGITAL DIAGNOSTIC UNILATERAL LEFT MAMMOGRAM WITH TOMOSYNTHESIS AND CAD; ULTRASOUND LEFT BREAST LIMITED TECHNIQUE: Left digital diagnostic  mammography and breast tomosynthesis was performed. The images were evaluated with computer-aided detection. ; Targeted ultrasound examination of the left breast was performed. COMPARISON:  Previous exam(s). ACR Breast Density Category b: There are scattered areas of fibroglandular density. FINDINGS: Spot compression tomosynthesis views demonstrate a persistent area of architectural distortion in the LEFT upper outer breast at middle depth. It is best seen on ML slice 47, spot CC slice 26 and spot MLO volume 1 slice 39. A second questioned asymmetry resolves with additional views and assumes a configuration stable dating back to at least 2020, consistent with a benign etiology. On physical exam, no discrete mass is appreciated. Targeted ultrasound was performed LEFT upper outer breast. At 1:30 6 cm from nipple there is an irregular hypoechoic mass with irregular margins. This measures 5 x 5 x 4 mm. This corresponds well to the site of mammographic concern. Targeted ultrasound was performed of the LEFT axilla. No suspicious axillary lymph nodes are visualized. IMPRESSION: 1. There is a suspicious 5 mm mass at the site of mammographic concern with associated architectural distortion. Recommend ultrasound-guided biopsy for definitive characterization. 2. No suspicious LEFT axillary adenopathy. RECOMMENDATION: LEFT breast ultrasound-guided biopsy x1 I have discussed the findings and recommendations with the patient. The biopsy procedure was discussed with the patient and questions were answered. Patient expressed their understanding of the biopsy recommendation. Patient will be scheduled for biopsy at her earliest convenience by the schedulers. Ordering provider will be notified. If applicable, a reminder letter will be sent to the patient regarding the next appointment. BI-RADS CATEGORY  4: Suspicious. Electronically Signed   By: Corean Salter M.D.   On: 08/02/2024 10:07   US  LIMITED ULTRASOUND INCLUDING AXILLA  LEFT BREAST  Result Date: 08/02/2024 CLINICAL DATA:  LEFT breast callback EXAM: DIGITAL DIAGNOSTIC UNILATERAL LEFT MAMMOGRAM WITH TOMOSYNTHESIS AND CAD; ULTRASOUND LEFT BREAST LIMITED TECHNIQUE: Left digital diagnostic mammography and breast tomosynthesis was performed. The images were evaluated with computer-aided detection. ; Targeted ultrasound examination of the left breast was performed. COMPARISON:  Previous exam(s). ACR Breast Density Category b: There are scattered areas of fibroglandular density. FINDINGS: Spot compression tomosynthesis views demonstrate a persistent area of architectural distortion in the LEFT upper outer breast at middle depth. It is best seen on ML slice 47, spot CC slice 26 and spot MLO volume 1 slice 39. A second questioned asymmetry resolves with additional views and assumes a configuration stable dating back to at least 2020, consistent with a benign etiology. On physical exam, no discrete mass is appreciated. Targeted ultrasound was performed LEFT upper outer breast. At 1:30 6 cm from nipple there is an irregular hypoechoic mass with irregular margins. This measures 5 x 5 x 4 mm. This corresponds well to the site of mammographic concern. Targeted ultrasound was performed of the LEFT axilla. No suspicious axillary lymph nodes are visualized. IMPRESSION: 1. There is a suspicious 5 mm mass at the site of mammographic concern with associated architectural distortion. Recommend ultrasound-guided biopsy for definitive characterization. 2. No suspicious LEFT axillary adenopathy. RECOMMENDATION: LEFT breast ultrasound-guided biopsy x1 I have discussed the findings and recommendations with the patient. The biopsy procedure was discussed with the patient and questions were  answered. Patient expressed their understanding of the biopsy recommendation. Patient will be scheduled for biopsy at her earliest convenience by the schedulers. Ordering provider will be notified. If applicable, a reminder  letter will be sent to the patient regarding the next appointment. BI-RADS CATEGORY  4: Suspicious. Electronically Signed   By: Corean Salter M.D.   On: 08/02/2024 10:07   MM 3D SCREENING MAMMOGRAM BILATERAL BREAST Result Date: 07/27/2024 CLINICAL DATA:  Screening. EXAM: DIGITAL SCREENING BILATERAL MAMMOGRAM WITH TOMOSYNTHESIS AND CAD TECHNIQUE: Bilateral screening digital craniocaudal and mediolateral oblique mammograms were obtained. Bilateral screening digital breast tomosynthesis was performed. The images were evaluated with computer-aided detection. COMPARISON:  Previous exam(s). ACR Breast Density Category b: There are scattered areas of fibroglandular density. FINDINGS: In the left breast, possible distortion and a separate possible asymmetry warrants further evaluation. In the right breast, no findings suspicious for malignancy. IMPRESSION: Further evaluation is suggested for possible distortion and a possible asymmetry in the left breast. RECOMMENDATION: Diagnostic mammogram and possibly ultrasound of the left breast. (Code:[id number]-[id number]) The patient will be contacted regarding the findings, and additional imaging will be scheduled. BI-RADS CATEGORY  0: Incomplete: Need additional imaging evaluation. Electronically Signed   By: Dirk Arrant M.D.   On: 07/27/2024 09:29    Assessment and plan- Patient is a 59 y.o. female with newly diagnosed invasive mammary carcinoma of the left breast clinical prognostic stage Ia T1a N0 M0 ER positive PR positive HER2 negative here to discuss further management  Patient has a small 5 mm biologically favorable subtype of breast cancer that was node-negative.  ER 95% positive PR negative and HER2 negative.  Ki-67 less than 1%.  I would recommend upfront lumpectomy and consideration for sentinel lymph node biopsy for her.  If her final pathology shows grade 1 tumor greater than 1 cm or grade 2 tumor or grade 3 tumor more than 5 mm we will consider Oncotype testing.   Discussed what Oncotype testing is and how the results are interpreted.  Patient is unlikely to require chemotherapy but that will be determined after surgery.  I am also referring her to radiation oncology for consideration of adjuvant radiation upon completion of lumpectomy.  Patient does have ER-positive disease and would benefit from adjuvant endocrine therapy which I will discuss with her in greater detail when I see her back.  I am obtaining baseline FSH and estradiol  levels today given that she has undergone hysterectomy in her 30s.  Treatment will be given with curative intent.  We are also checking blood draw for genetic testing today   Cancer Staging  Malignant neoplasm of upper-outer quadrant of left breast in female, estrogen receptor positive (HCC) Staging form: Breast, AJCC 8th Edition - Clinical stage from 08/13/2024: Stage IA (cT1a, cN0, cM0, G1, ER+, PR-, HER2-) - Signed by Melanee Annah BROCKS, MD on 08/13/2024 Stage prefix: Initial diagnosis Histologic grading system: 3 grade system     Thank you for this kind referral and the opportunity to participate in the care of this patient   Visit Diagnosis 1. Malignant neoplasm of upper-outer quadrant of left breast in female, estrogen receptor positive (HCC)   2. Goals of care, counseling/discussion     Dr. Annah Melanee, MD, MPH [facility] [phone] 08/13/2024

## 2024-08-13 NOTE — Patient Instructions (Signed)
 We have spoken today about removing a lump in your breast. This will be done by Dr. Mauri Sous at [facility].  If you are on any injectable weight loss medication, you will need to stop taking your GLP-1 injectable (weight loss) medications 8 days before your surgery to avoid any complications with anesthesia.   You will most likely be able to leave the hospital several hours after your surgery. Rarely, a patient needs to stay over night but this is a possibility.  Plan to tentatively be off work for 1-2 weeks following the surgery and may return with approximately 2 more weeks of a lifting restriction, no greater than 15 lbs.  Please see your Blue surgery sheet for more information. Our surgery scheduler will call you to look at surgery dates and to go over information.   If you have FMLA or Disability paperwork that needs to be filled out, please have your company fax your paperwork to [phone] or you may drop this by either office. This paperwork will be filled out within 3 days after your surgery has been completed.    Lumpectomy A lumpectomy is a form of breast conserving or breast preservation surgery. It may also be referred to as a partial mastectomy. During a lumpectomy, the portion of the breast that contains the cancerous tumor or breast mass (the lump) is removed. Some normal tissue around the lump may also be removed to make sure all of the tumor has been removed.  LET [facility] CARE PROVIDER KNOW ABOUT: Any allergies you have. All medicines you are taking, including vitamins, herbs, eye drops, creams, and over-the-counter medicines. Previous problems you or members of your family have had with the use of anesthetics. Any blood disorders you have. Previous surgeries you have had. Medical conditions you have. RISKS AND COMPLICATIONS Generally, this is a safe procedure. However, problems can occur and include: Bleeding. Infection. Pain. Temporary swelling. Change in the  shape of the breast, particularly if a large portion is removed. BEFORE THE PROCEDURE Ask your health care provider about changing or stopping your regular medicines. This is especially important if you are taking diabetes medicines or blood thinners. Do not eat or drink anything after midnight on the night before the procedure or as directed by your health care provider. Ask your health care provider if you can take a sip of water with any approved medicines. On the day of surgery, your health care provider will use a mammogram or ultrasound to locate and mark the tumor in your breast. These markings on your breast will show where the cut (incision) will be made.   PROCEDURE  An IV tube will be put into one of your veins. You may be given medicine to help you relax before the surgery (sedative). You will be given one of the following: A medicine that numbs the area (local anesthetic). A medicine that makes you fall asleep (general anesthetic). Your health care provider will use a kind of electric scalpel that uses heat to minimize bleeding (electrocautery knife). A curved incision (like a smile or frown) that follows the natural curve of your breast is made, to allow for minimal scarring and better healing. The tumor will be removed with some of the surrounding tissue. This will be sent to the lab for analysis. Your health care provider may also remove your lymph nodes at this time if needed. Sometimes, but not always, a rubber tube called a drain will be surgically inserted into your breast area or  armpit to collect excess fluid that may accumulate in the space where the tumor was. This drain is connected to a plastic bulb on the outside of your body. This drain creates suction to help remove the fluid. The incisions will be closed with stitches (sutures). A bandage may be placed over the incisions. AFTER THE PROCEDURE You will be taken to the recovery area. You will be given medicine for  pain. A small rubber drain may be placed in the breast for 2-3 days to prevent a collection of blood (hematoma) from developing in the breast. You will be given instructions on caring for the drain before you go home. A pressure bandage (dressing) will be applied for 1-2 days to prevent bleeding. Ask your health care provider how to care for your bandage at home.   This information is not intended to replace advice given to you by your health care provider. Make sure you discuss any questions you have with your health care provider.   Document Released: 01/24/2007 Document Revised: 01/03/2015 Document Reviewed: 05/18/2013 Elsevier Interactive Patient Education [employer]

## 2024-08-13 NOTE — Progress Notes (Signed)
 Accompanied patient and family to initial medical oncology appointment.   Reviewed Breast Cancer treatment handbook.   Care plan summary given to patient.   Reviewed outreach programs and cancer center services.

## 2024-08-13 NOTE — Progress Notes (Signed)
 08/13/2024  Reason for Visit:  Left breast cancer  Requesting Provider:  Allena Hamilton, MD  History of Present Illness: Angela Pacheco is a 58 y.o. female presenting for evaluation of newly diagnosed left breast cancer.  The patient had her routine screening mammogram on 07/25/24 which showed an area of distortion in the left breast.  Diagnostic mammogram and ultrasound on 08/02/24 showed a 5 x 5 x 4 mm mass in the left breast, 1:30 position, 6 CMFN.  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  Prior to her mammograms, she did not feel any masses or noted any skin changes.  Prior mammograms had noted a different area of stable distortion since 2020.  She has a history of breast cancer in her paternal cousin.  Menses started at age 85.  She has had two pregnancies, first at age 48.  Has taken birth control in the past.  She had a hysterectomy at age 7.  She has met with Dr. Melanee earlier today.  Separately, the patient also reports having a small mass in the lateral aspect of her right chest wall and is wondering if that is a lipoma.  Past Medical History: Past Medical History:  Diagnosis Date   Allergy     cats trigger   Arthritis June 2022   Asthma Years ago   mild   B12 deficiency 05/06/2019   Complication of anesthesia    Dysplastic nevus 11/01/2017   Right middle shoulder, mod, excision by Dr Arlyss   Dysplastic nevus 07/03/2020   L medial scapular back, lat to T5, moderate, excision by Dr Arlyss   Dysplastic nevus 06/09/2020   L scapular back lateral, mild   Hypercholesteremia 01/31/2024   Hypothyroidism    Oxygen deficiency 2023   Use mouth sleep appliance. Respire   Sleep apnea 2023   Thyroid  disease 1998     Past Surgical History: Past Surgical History:  Procedure Laterality Date   ABDOMINAL HYSTERECTOMY     cervix left in place   ANTERIOR CRUCIATE LIGAMENT REPAIR Left 2017   APPENDECTOMY  1991   BREAST BIOPSY  Left 08/03/2024   US  LT BREAST BX W LOC DEV 1ST LESION IMG BX SPEC US  GUIDE 08/03/2024 [facility]   CHOLECYSTECTOMY     COLONOSCOPY WITH PROPOFOL  N/A 10/08/2019   Procedure: COLONOSCOPY WITH PROPOFOL ;  Surgeon: Unk Corinn Skiff, MD;  Location: [facility] ENDOSCOPY;  Service: Gastroenterology;  Laterality: N/A;    Home Medications: Prior to Admission medications   Medication Sig Start Date End Date Taking? Authorizing Provider  albuterol  (VENTOLIN  HFA) 108 (90 Base) MCG/ACT inhaler INHALE 2 PUFFS INTO THE LUNGS EVERY 6 HOURS AS NEEDED FOR WHEEZING OR SHORTNESS OF BREATH 04/09/22   Hamilton Allena, MD  Calcium Carbonate-Vitamin D (CALTRATE 600+D PO) Take 1 tablet by mouth 2 (two) times daily.    [provider]  cyanocobalamin  (VITAMIN B12) 1000 MCG/ML injection INJECT 1 ML IN THE MUSCLE EVERY 30 DAYS 12/14/23   Hamilton Allena, MD  estradiol  (ESTRACE ) 0.1 MG/GM vaginal cream Place 1 Applicatorful vaginally once a week. Patient not taking: Reported on 08/13/2024 03/18/23   [provider]  levothyroxine  (SYNTHROID ) 100 MCG tablet TAKE 1 TABLET(100 MCG) BY MOUTH DAILY 09/29/23   Hamilton Allena, MD  Magnesium 400 MG CAPS Take 1 tablet by mouth daily at 6 (six) AM.    [provider]  omega-3 acid ethyl esters (LOVAZA) 1 g capsule Take by mouth daily.  [provider]  SYRINGE-NEEDLE, DISP, 3 ML (BD INTEGRA SYRINGE) 25G X 1 3 ML MISC Use as directed with B12 injections 12/14/23   Glendia Shad, MD  Triamcinolone Acetonide (NASACORT AQ NA) Place into the nose.    [provider]  vitamin C (ASCORBIC ACID) 500 MG tablet Take 500 mg by mouth daily.    [provider]    Allergies: Allergies  Allergen Reactions   Wound Dressing Adhesive Rash    BAND-AIDS    Social History:  reports that she has never smoked. She has never used smokeless tobacco. She reports current alcohol use. She reports that she does not use drugs.   Family  History: Family History  Problem Relation Age of Onset   Arthritis Mother    Hearing loss Mother    Cancer Father        Stage 4 Melanoma   Heart disease Father    Arthritis Sister    Hearing loss Sister    Heart disease Brother    Clotting disorder Brother    Colon cancer Maternal Grandmother    Colon cancer Maternal Grandfather    Asthma Daughter    Autoimmune disease Daughter    Breast cancer Paternal Cousin    Colon cancer Maternal Uncle        in polyp    Review of Systems: Review of Systems  Constitutional:  Negative for chills and fever.  Respiratory:  Negative for shortness of breath.   Cardiovascular:  Negative for chest pain.  Gastrointestinal:  Negative for nausea and vomiting.  Genitourinary:  Negative for dysuria.  Musculoskeletal:  Negative for myalgias.  Skin:  Negative for rash.  Neurological:  Negative for dizziness.  Psychiatric/Behavioral:  Negative for depression.     Physical Exam BP 118/75   Pulse 84   Temp 98.6 F (37 C) (Oral)   Ht 5' (1.524 m)   Wt 117 lb 3.2 oz (53.2 kg)   SpO2 96%   BMI 22.89 kg/m  CONSTITUTIONAL: No acute distress, well nourished. HEENT:  Normocephalic, atraumatic, extraocular motion intact. NECK: Trachea is midline, and there is no jugular venous distension.  RESPIRATORY:  Lungs are clear, and breath sounds are equal bilaterally. Normal respiratory effort without pathologic use of accessory muscles. CARDIOVASCULAR: Heart is regular without murmurs, gallops, or rubs. BREAST:  Left breast with healing biopsy site and resolving minimal ecchymosis.  No palpable masses, other skin changes, or nipple changes.  No left axillary lymphadenopathy.  Right breast without any palpable masses, skin changes, or nipple changes.  No right axillary lymphadenopathy. MUSCULOSKELETAL:  Normal muscle strength and tone in all four extremities.  No peripheral edema or cyanosis. SKIN: The patient has a 2 cm lipoma in the right lateral chest  wall.  It is soft, mobile, non-tender, without any erythema. NEUROLOGIC:  Motor and sensation is grossly normal.  Cranial nerves are grossly intact. PSYCH:  Alert and oriented to person, place and time. Affect is normal.  Laboratory Analysis: Labs from 08/01/24: Na 139, K 4, Cl 102, CO2 29, BUN 20, Cr 0.9.  LFTs within normal.  WBC 9.4, Hgb 14.8, Hct 43.8, Plt 338.  Left breast biopsy on 08/03/24: FINAL DIAGNOSIS       1. Breast, left, needle core biopsy, 1:30 6cmfn, coil :       - INVASIVE DUCTAL CARCINOMA, SEE NOTE       - DUCTAL CARCINOMA IN SITU, INTERMEDIATE GRADE       - TUBULE FORMATION: SCORE  1       - NUCLEAR PLEOMORPHISM: SCORE 2       - MITOTIC COUNT: SCORE 1       - TOTAL SCORE: 4       - OVERALL GRADE: 1       - LYMPHOVASCULAR INVASION: NOT IDENTIFIED       - CANCER LENGTH: 0.5 CM       - CALCIFICATIONS: NOT IDENTIFIED       - OTHER FINDINGS: NONE   Imaging: Mammogram and U/S on 08/02/24: FINDINGS: Spot compression tomosynthesis views demonstrate a persistent area of architectural distortion in the LEFT upper outer breast at middle depth. It is best seen on ML slice 47, spot CC slice 26 and spot MLO volume 1 slice 39. A second questioned asymmetry resolves with additional views and assumes a configuration stable dating back to at least 2020, consistent with a benign etiology.   On physical exam, no discrete mass is appreciated.   Targeted ultrasound was performed LEFT upper outer breast. At 1:30 6 cm from nipple there is an irregular hypoechoic mass with irregular margins. This measures 5 x 5 x 4 mm. This corresponds well to the site of mammographic concern.   Targeted ultrasound was performed of the LEFT axilla. No suspicious axillary lymph nodes are visualized.   IMPRESSION: 1. There is a suspicious 5 mm mass at the site of mammographic concern with associated architectural distortion. Recommend ultrasound-guided biopsy for definitive characterization.  2. No suspicious  LEFT axillary adenopathy.   RECOMMENDATION: LEFT breast ultrasound-guided biopsy x1   I have discussed the findings and recommendations with the patient. The biopsy procedure was discussed with the patient and questions were answered. Patient expressed their understanding of the biopsy recommendation. Patient will be scheduled for biopsy at her earliest convenience by the schedulers. Ordering provider will be notified. If applicable, a reminder letter will be sent to the patient regarding the next appointment.   BI-RADS CATEGORY  4: Suspicious.  Assessment and Plan: This is a 58 y.o. female with newly diagnosed left breast cancer and right chest wall lipoma.  --Discussed with the patient the findings on her imaging and biopsy.  Discussed with her the possible treatment options including lumpectomy followed by radiation vs mastectomy, the possibility for reconstruction, that either option includes a sentinel lymph node biopsy to evaluate for any possible spread of her cancer.  Discussed the post-op recovery for both, and she has opted to proceed with lumpectomy. --Discussed with her the the plan for a left breast tag localized lumpectomy and sentinel lymph node biopsy and excision of right chest wall lipoma.  Reviewed the surgery at length with her including the preoperative SAVI tag placement, planned incisions, use of blue dye and radiotracer to evaluate the sentinel lymph nodes, risks of bleeding, infection, injury to surrounding structures, lymphedema, that this would be an outpatient surgery, post-operative use of a breast binder, activity restrictions, pain control, and she's willing to proceed. --Will schedule her for surgery on 08/23/24.  All of her questions have been answered.  I spent 55 minutes dedicated to the care of this patient on the date of this encounter to include pre-visit review of records, face-to-face time with the patient discussing diagnosis and management, and any post-visit  coordination of care.   Aloysius Sheree Plant, MD [facility]

## 2024-08-14 ENCOUNTER — Other Ambulatory Visit: Payer: Self-pay | Admitting: *Deleted

## 2024-08-14 ENCOUNTER — Telehealth: Payer: Self-pay

## 2024-08-14 ENCOUNTER — Encounter: Payer: Self-pay | Admitting: *Deleted

## 2024-08-14 DIAGNOSIS — Z9189 Other specified personal risk factors, not elsewhere classified: Secondary | ICD-10-CM

## 2024-08-14 DIAGNOSIS — C50912 Malignant neoplasm of unspecified site of left female breast: Secondary | ICD-10-CM

## 2024-08-14 LAB — ESTRADIOL: Estradiol: <5 pg/mL

## 2024-08-14 LAB — FSH/LH
FSH: 84.6 m[IU]/mL
LH: 33 m[IU]/mL

## 2024-08-14 NOTE — Telephone Encounter (Signed)
 Message left for the patient about surgery  Patient has been advised of Pre-Admission date/time, and Surgery date/time at [facility].  Surgery Date: 08/23/24 Preadmission Testing Date: 08/17/24 (phone 1P-4P) Surgery Arrival time: 12:30 pm enter in through the Medical Mall entrance of the hospital.

## 2024-08-14 NOTE — Progress Notes (Signed)
 Lumpectomy is scheduled for 8/28.   She will see Dr. Melanee and Dr. Lenn back on 9/17.

## 2024-08-15 ENCOUNTER — Ambulatory Visit
Admission: RE | Admit: 2024-08-15 | Discharge: 2024-08-15 | Disposition: A | Discharge status: Home / Self Care | Source: Ambulatory Visit | Attending: Surgery | Admitting: Surgery

## 2024-08-15 ENCOUNTER — Other Ambulatory Visit

## 2024-08-15 DIAGNOSIS — C50912 Malignant neoplasm of unspecified site of left female breast: Secondary | ICD-10-CM

## 2024-08-15 HISTORY — PX: BREAST BIOPSY: SHX20

## 2024-08-15 MED ORDER — LIDOCAINE HCL 1 % IJ SOLN
5.0000 mL | Freq: Once | INTRAMUSCULAR | Status: AC
  Administered 2024-08-15: 5 mL
  Filled 2024-08-15: qty 5

## 2024-08-17 ENCOUNTER — Encounter
Admission: RE | Admit: 2024-08-17 | Discharge: 2024-08-17 | Disposition: A | Discharge status: Home / Self Care | Source: Ambulatory Visit | Attending: Surgery | Admitting: Surgery

## 2024-08-17 ENCOUNTER — Other Ambulatory Visit: Payer: Self-pay

## 2024-08-17 HISTORY — DX: Other specified postprocedural states: Z98.890

## 2024-08-17 HISTORY — DX: Nausea with vomiting, unspecified: R11.2

## 2024-08-17 NOTE — Patient Instructions (Addendum)
 Your procedure is scheduled on:   THURSDAY AUGUST 28  Report to the Registration Desk on the 1st floor of the [facility] To find out your arrival time, please call [phone] between 1PM - 3PM on:   [facility]  AUGUST 27  If your arrival time is 6:00 am, do not arrive before that time as the Medical Mall entrance doors do not open until 6:00 am.  REMEMBER: Instructions that are not followed completely may result in serious medical risk, up to and including death; or upon the discretion of your surgeon and anesthesiologist your surgery may need to be rescheduled.  Do not eat food after midnight the night before surgery.  No gum chewing or hard candies.  You may however, drink CLEAR liquids up to 2 hours before you are scheduled to arrive for your surgery. Do not drink anything within 2 hours of your scheduled arrival time.  Clear liquids include: - water  - apple juice without pulp - gatorade (not RED colors) - black coffee or tea (Do NOT add milk or creamers to the coffee or tea) Do NOT drink anything that is not on this list.   One week prior to surgery: Stop Anti-inflammatories (NSAIDS) such as Advil, Aleve, Ibuprofen, Motrin, Naproxen, Naprosyn and Aspirin based products such as Excedrin, Goody's Powder, BC Powder. Stop ANY OVER THE COUNTER supplements until after surgery. Calcium Carbonate-Vit D-Min  cyanocobalamin  (VITAMIN B12)  Magnesium  Omega-3 Fatty Acids (OMEGA 3 FISH OIL PO)  vitamin C (ASCORBIC ACID)   You may however, continue to take Tylenol if needed for pain up until the day of surgery.  Continue taking all of your other prescription medications up until the day of surgery.  ON THE DAY OF SURGERY ONLY TAKE THESE MEDICATIONS WITH SIPS OF WATER:  levothyroxine  (SYNTHROID )  omeprazole (PRILOSEC OTC)   No Alcohol for 24 hours before or after surgery.  Do not use any recreational drugs for at least a week (preferably 2 weeks) before your surgery.  Please  be advised that the combination of cocaine and anesthesia may have negative outcomes, up to and including death. If you test positive for cocaine, your surgery will be cancelled.  On the morning of surgery brush your teeth with toothpaste and water, you may rinse your mouth with mouthwash if you wish. Do not swallow any toothpaste or mouthwash.  Use CHG Soap as directed on instruction sheet.  Do not wear jewelry, make-up, hairpins, clips or nail polish.  For welded (permanent) jewelry: bracelets, anklets, waist bands, etc.  Please have this removed prior to surgery.  If it is not removed, there is a chance that hospital personnel will need to cut it off on the day of surgery.  Do not wear lotions, powders, or perfumes.   Do not shave body hair from the neck down 48 hours before surgery.  Contact lenses, hearing aids and dentures may not be worn into surgery.  Do not bring valuables to the hospital. [facility] is not responsible for any missing/lost belongings or valuables.   Notify your doctor if there is any change in your medical condition (cold, fever, infection).  Wear comfortable clothing (specific to your surgery type) to the hospital.  After surgery, you can help prevent lung complications by doing breathing exercises.  Take deep breaths and cough every 1-2 hours.  If you are being discharged the day of surgery, you will not be allowed to drive home. You will need a responsible individual to drive  you home and stay with you for 24 hours after surgery.   If you are taking public transportation, you will need to have a responsible individual with you.  Please call the Pre-admissions Testing Dept. at [phone] if you have any questions about these instructions.  Surgery Visitation Policy:  Patients having surgery or a procedure may have two visitors.  Children under the age of 72 must have an adult with them who is not the patient.  Merchandiser, retail to  address health-related social needs:  [email or web address]                                                                                                                   Preparing for Surgery with CHLORHEXIDINE GLUCONATE (CHG) Soap  Chlorhexidine Gluconate (CHG) Soap  o An antiseptic cleaner that kills germs and bonds with the skin to continue killing germs even after washing  o Used for showering the night before surgery and morning of surgery  Before surgery, you can play an important role by reducing the number of germs on your skin.  CHG (Chlorhexidine gluconate) soap is an antiseptic cleanser which kills germs and bonds with the skin to continue killing germs even after washing.  Please do not use if you have an allergy  to CHG or antibacterial soaps. If your skin becomes reddened/irritated stop using the CHG.  1. Shower the NIGHT BEFORE SURGERY and the MORNING OF SURGERY with CHG soap.  2. If you choose to wash your hair, wash your hair first as usual with your normal shampoo.  3. After shampooing, rinse your hair and body thoroughly to remove the shampoo.  4. Use CHG as you would any other liquid soap. You can apply CHG directly to the skin and wash gently with a scrungie or a clean washcloth.  5. Apply the CHG soap to your body only from the neck down. Do not use on open wounds or open sores. Avoid contact with your eyes, ears, mouth, and genitals (private parts). Wash face and genitals (private parts) with your normal soap.  6. Wash thoroughly, paying special attention to the area where your surgery will be performed.  7. Thoroughly rinse your body with warm water.  8. Do not shower/wash with your normal soap after using and rinsing off the CHG soap.  9. Pat yourself dry with a clean towel.  10. Wear clean pajamas to bed the night before surgery.  11. Place clean sheets on your bed the night of your first shower and do not sleep with pets.  12.  Shower again with the CHG soap on the day of surgery prior to arriving at the hospital.  13. Do not apply any deodorants/lotions/powders.  14. Please wear clean clothes to the hospital.

## 2024-08-18 ENCOUNTER — Encounter: Payer: Self-pay | Admitting: Surgery

## 2024-08-20 ENCOUNTER — Inpatient Hospital Stay

## 2024-08-20 ENCOUNTER — Other Ambulatory Visit: Payer: Self-pay

## 2024-08-20 LAB — GENETIC SCREENING ORDER

## 2024-08-20 MED ORDER — LIDOCAINE-PRILOCAINE 2.5-2.5 % EX CREA
TOPICAL_CREAM | CUTANEOUS | 0 refills | Status: DC
Start: 2024-08-20 — End: 2024-09-05

## 2024-08-20 NOTE — Progress Notes (Signed)
 Tumor Board Documentation  Angela Pacheco was presented by Dr. Melanee at our Tumor Board on 08/20/2024, which included representatives from medical oncology, radiation oncology, surgical, radiology, pathology, navigation.  Angela Pacheco currently presents as a current patient with history of the following treatments:  .  Additionally, we reviewed previous medical and familial history, history of present illness, and recent lab results along with all available histopathologic and imaging studies. The tumor board considered available treatment options and made the following recommendations: Surgery, Adjuvant radiation, Hormonal therapy    The following procedures/referrals were also placed: No orders of the defined types were placed in this encounter.   Clinical Trial Status:     Staging used:    National site-specific guidelines   were discussed with respect to the case.  Tumor board is a meeting of clinicians from various specialty areas who evaluate and discuss patients for whom a multidisciplinary approach is being considered. Final determinations in the plan of care are those of the provider(s). The responsibility for follow up of recommendations given during tumor board is that of the provider.   Today's extended care, comprehensive team conference, [place] was not present for the discussion and was not examined.   Multidisciplinary Tumor Board is a multidisciplinary case peer review process.  Decisions discussed in the Multidisciplinary Tumor Board reflect the opinions of the specialists present at the conference without having examined the patient.  Ultimately, treatment and diagnostic decisions rest with the primary provider(s) and the patient.

## 2024-08-21 ENCOUNTER — Telehealth: Payer: Self-pay | Admitting: Licensed Clinical Social Worker

## 2024-08-21 ENCOUNTER — Encounter: Payer: Self-pay | Admitting: Licensed Clinical Social Worker

## 2024-08-21 NOTE — Telephone Encounter (Signed)
 I contacted Ms. Sorrels to discuss her genetic testing results. No pathogenic variants were identified in the 40 genes analyzed. Detailed clinic note to follow.   The test report has been scanned into EPIC and is located under the Molecular Pathology section of the Results Review tab.  A portion of the result report is included below for reference.      Dena Cary, MS, [facility] Genetic Counselor [email or web address] Phone: [phone]

## 2024-08-22 ENCOUNTER — Encounter: Payer: Self-pay | Admitting: Licensed Clinical Social Worker

## 2024-08-22 ENCOUNTER — Ambulatory Visit: Payer: Self-pay | Admitting: Licensed Clinical Social Worker

## 2024-08-22 DIAGNOSIS — Z800 Family history of malignant neoplasm of digestive organs: Secondary | ICD-10-CM

## 2024-08-22 DIAGNOSIS — Z170 Estrogen receptor positive status [ER+]: Secondary | ICD-10-CM

## 2024-08-22 DIAGNOSIS — Z801 Family history of malignant neoplasm of trachea, bronchus and lung: Secondary | ICD-10-CM

## 2024-08-22 DIAGNOSIS — Z1379 Encounter for other screening for genetic and chromosomal anomalies: Secondary | ICD-10-CM

## 2024-08-22 DIAGNOSIS — Z808 Family history of malignant neoplasm of other organs or systems: Secondary | ICD-10-CM

## 2024-08-22 DIAGNOSIS — Z803 Family history of malignant neoplasm of breast: Secondary | ICD-10-CM

## 2024-08-22 NOTE — Progress Notes (Signed)
 HISTORY OF PRESENT ILLNESS:   Angela Pacheco, a 58 y.o. female, was seen for a [facility] cancer genetics consultation at the request of Dr. Melanee due to a personal and family history of cancer.  Angela Pacheco presents to clinic today to discuss the possibility of a hereditary predisposition to cancer, genetic testing, and to further clarify her future cancer risks, as well as potential cancer risks for family members.   CANCER HISTORY:  Oncology History  Malignant neoplasm of upper-outer quadrant of left breast in female, estrogen receptor positive (HCC)  08/13/2024 Initial Diagnosis   Malignant neoplasm of upper-outer quadrant of left breast in female, estrogen receptor positive (HCC)   08/13/2024 Cancer Staging   Staging form: Breast, AJCC 8th Edition - Clinical stage from 08/13/2024: Stage IA (cT1a, cN0, cM0, G1, ER+, PR-, HER2-) - Signed by Melanee Annah BROCKS, MD on 08/13/2024 Stage prefix: Initial diagnosis Histologic grading system: 3 grade system   08/21/2024 Genetic Testing   Negative genetic testing. No pathogenic variants identified on the [employer] + CancerNext+RNA Panel. The report date is 08/21/2024.  The [employer] Panel includes sequencing, rearrangement analysis, and RNA analysis for the following 13 genes: ATM, BARD1, BRCA1, BRCA2, CDH1, CHEK2, NF1, PALB2, PTEN, RAD51C, RAD51D, STK11 and TP53 (sequencing and deletion/duplication).  The [employer] CancerNext+RNAinsight Panel includes sequencing, rearrangement analysis, and RNA analysis for the following 40 genes: APC, ATM, BAP1, BARD1, BMPR1A, BRCA1, BRCA2, BRIP1, CDH1, CDKN2A, CHEK2, FH, FLCN, MET, MLH1, MSH2, MSH6, MUTYH, NF1, NTHL1, PALB2, PMS2, PTEN, RAD51C, RAD51D, RPS20, SMAD4, STK11, TP53, TSC1, TSC2, and VHL (sequencing and deletion/duplication); AXIN2, HOXB13, MBD4, MSH3, POLD1 and POLE (sequencing only); EPCAM and GREM1 (deletion/duplication only).    In 2025, at the age of 42, Angela Pacheco was diagnosed with left breast  cancer, ER+, PR-, HER2-. The treatment plan includes lumpectomy which is scheduled for 08/23/2024, possible Oncotype testing, adjuvant radiation therapy and adjuvant endocrine therapy.  Menarche: 12 FLB: 25 Hysterectomy at 32, has ovaries.  Past Medical History:  Diagnosis Date   Allergy     cats trigger   Arthritis June 2022   Asthma Years ago   mild   B12 deficiency 05/06/2019   Complication of anesthesia    Dysplastic nevus 11/01/2017   Right middle shoulder, mod, excision by Dr Arlyss   Dysplastic nevus 07/03/2020   L medial scapular back, lat to T5, moderate, excision by Dr Arlyss   Dysplastic nevus 06/09/2020   L scapular back lateral, mild   Hypercholesteremia 01/31/2024   Hypothyroidism    Malignant neoplasm of upper-outer quadrant of left breast in female, estrogen receptor positive (HCC) 08/13/2024   Oxygen deficiency 2023   Use mouth sleep appliance. Respire   PONV (postoperative nausea and vomiting)    Sleep apnea 2023   Thyroid  disease 1998    Past Surgical History:  Procedure Laterality Date   ABDOMINAL HYSTERECTOMY     cervix left in place   ANTERIOR CRUCIATE LIGAMENT REPAIR Left 2017   APPENDECTOMY  1991   BREAST BIOPSY Left 08/03/2024   US  LT BREAST BX W LOC DEV 1ST LESION IMG BX SPEC US  GUIDE 08/03/2024 [facility]   BREAST BIOPSY Left 08/15/2024   US  LT BREAST SAVI/RF TAG 1ST LESION US  GUIDE 08/15/2024 [facility]   CHOLECYSTECTOMY     COLONOSCOPY WITH PROPOFOL  N/A 10/08/2019   Procedure: COLONOSCOPY WITH PROPOFOL ;  Surgeon: Unk Corinn Skiff, MD;  Location: [facility];  Service: Gastroenterology;  Laterality: N/A;    FAMILY HISTORY:  We obtained a  detailed, 4-generation family history.  Significant diagnoses are listed below: Family History  Problem Relation Age of Onset   Arthritis Mother    Hearing loss Mother    Melanoma Father        stage IV   Heart disease Father    Arthritis Sister    Hearing loss Sister    Heart disease  Brother    Clotting disorder Brother    Lung cancer Maternal Aunt    Colon cancer Maternal Uncle        in polyp   Colon cancer Maternal Grandmother 39   Colon cancer Maternal Grandfather 106   Asthma Daughter    Autoimmune disease Daughter    Breast cancer Paternal Cousin    Angela Pacheco has 1 brother and 1 sister. She has 1 daughter and 1 son, no cancers.  Angela Pacheco's mother is living at 28. Patient has 2 maternal aunts, 1 uncle. An aunt died of lung cancer. A maternal cousin died of lung cancer. Her uncle had colon cancer in a polyp. Maternal and paternal grandparents had colon cancer in their 67s.   Angela Pacheco's father has stage IV melanoma and is living at 33. A paternal cousin had breast cancer.  Angela Pacheco is unaware of previous family history of genetic testing for hereditary cancer risks. There is no reported Ashkenazi Jewish ancestry. There is no known consanguinity.    GENETIC COUNSELING ASSESSMENT: Angela Pacheco is a 58 y.o. female with a personal and family history of cancer and negative genetic testing. We, therefore, discussed and recommended the following at today's visit.   DISCUSSION: We discussed that approximately 10% of breast cancer is hereditary. Most cases of hereditary breast cancer are associated with BRCA1/BRCA2 genes, although there are other genes associated with hereditary  cancer as well. Cancers and risks are gene specific. We discussed that testing is beneficial for several reasons including knowing about cancer risks, identifying potential screening and risk-reduction options that may be appropriate, and to understand if other family members could be at risk for cancer and allow them to undergo genetic testing.   We reviewed the characteristics, features and inheritance patterns of hereditary cancer syndromes. We also discussed genetic testing, including the appropriate family members to test, the process of testing, insurance coverage and  turn-around-time for results. We discussed the implications of a negative, positive and/or variant of uncertain significant result. We recommended Angela Pacheco pursue genetic testing for the [employer] + CancerNext+RNA gene panel, which she had her blood drawn for last week and results are back.   GENETIC TEST RESULTS:  The [employer] + CancerNext+RNA Panel found no pathogenic mutations.   The [employer] Panel includes sequencing, rearrangement analysis, and RNA analysis for the following 13 genes: ATM, BARD1, BRCA1, BRCA2, CDH1, CHEK2, NF1, PALB2, PTEN, RAD51C, RAD51D, STK11 and TP53 (sequencing and deletion/duplication).  The [employer] CancerNext+RNAinsight Panel includes sequencing, rearrangement analysis, and RNA analysis for the following 40 genes: APC, ATM, BAP1, BARD1, BMPR1A, BRCA1, BRCA2, BRIP1, CDH1, CDKN2A, CHEK2, FH, FLCN, MET, MLH1, MSH2, MSH6, MUTYH, NF1, NTHL1, PALB2, PMS2, PTEN, RAD51C, RAD51D, RPS20, SMAD4, STK11, TP53, TSC1, TSC2, and VHL (sequencing and deletion/duplication); AXIN2, HOXB13, MBD4, MSH3, POLD1 and POLE (sequencing only); EPCAM and GREM1 (deletion/duplication only).   The test report has been scanned into EPIC and is located under the Molecular Pathology section of the Results Review tab.  A portion of the result report is included below for reference. Genetic testing reported out on 08/20/2024.  Even though a pathogenic variant was not identified, possible explanations for the cancer in the family may include: There may be no hereditary risk for cancer in the family. The cancers in Angela Pacheco and/or her family may be sporadic/familial or due to other genetic and environmental factors. There may be a gene mutation in one of these genes that current testing methods cannot detect but that chance is small. There could be another gene that has not yet been discovered, or that we have not yet tested, that is responsible for the cancer diagnoses in the  family.  It is also possible there is a hereditary cause for the cancer in the family that Angela Pacheco did not inherit.  Therefore, it is important to remain in touch with cancer genetics in the future so that we can continue to offer Angela Pacheco the most up to date genetic testing.   ADDITIONAL GENETIC TESTING:  We discussed with Angela Pacheco that her genetic testing was fairly extensive.  If there are additional relevant genes identified to increase cancer risk that can be analyzed in the future, we would be happy to discuss and coordinate this testing at that time.    CANCER SCREENING RECOMMENDATIONS:  Angela Pacheco's test result is considered negative (normal).  This means that we have not identified a hereditary cause for her personal and family history of cancer at this time.   An individual's cancer risk and medical management are not determined by genetic test results alone. Overall cancer risk assessment incorporates additional factors, including personal medical history, family history, and any available genetic information that may result in a personalized plan for cancer prevention and surveillance. Therefore, it is recommended she continue to follow the cancer management and screening guidelines provided by her oncology and primary healthcare provider.  RECOMMENDATIONS FOR FAMILY MEMBERS:   Since she did not inherit a identifiable mutation in a cancer predisposition gene included on this panel, her children could not have inherited a known mutation from her in one of these genes. Individuals in this family might be at some increased risk of developing cancer, over the general population risk, due to the family history of cancer.  Individuals in the family should notify their providers of the family history of cancer. We recommend women in this family have a yearly mammogram beginning at age 51, or 14 years younger than the earliest onset of cancer, an annual clinical breast exam, and  perform monthly breast self-exams.  Family members should have colonoscopies by at age 52, or earlier, as recommended by their providers.  FOLLOW-UP:  Lastly, we discussed with Angela Pacheco that cancer genetics is a rapidly advancing field and it is possible that new genetic tests will be appropriate for her and/or her family members in the future. We encouraged her to remain in contact with cancer genetics on an annual basis so we can update her personal and family histories and let her know of advances in cancer genetics that may benefit this family.   Our contact number was provided. Angela Pacheco's questions were answered to her satisfaction, and she knows she is welcome to call us  at anytime with additional questions or concerns.    Dena Cary, MS, [facility] Genetic Counselor [email or web address] Phone: [phone]

## 2024-08-23 ENCOUNTER — Other Ambulatory Visit: Payer: Self-pay

## 2024-08-23 ENCOUNTER — Ambulatory Visit
Admission: RE | Admit: 2024-08-23 | Discharge: 2024-08-23 | Disposition: A | Discharge status: Home / Self Care | Attending: Surgery | Admitting: Surgery

## 2024-08-23 ENCOUNTER — Ambulatory Visit
Admission: RE | Admit: 2024-08-23 | Discharge: 2024-08-23 | Disposition: A | Discharge status: Home / Self Care | Source: Ambulatory Visit | Attending: Surgery | Admitting: Surgery

## 2024-08-23 ENCOUNTER — Ambulatory Visit: Payer: Self-pay

## 2024-08-23 ENCOUNTER — Encounter
Admission: RE | Disposition: A | Discharge status: Home / Self Care | Payer: Self-pay | Source: Home / Self Care | Attending: Surgery

## 2024-08-23 ENCOUNTER — Encounter
Admission: RE | Admit: 2024-08-23 | Discharge: 2024-08-23 | Discharge status: Home / Self Care | Source: Ambulatory Visit | Attending: Surgery

## 2024-08-23 ENCOUNTER — Encounter: Payer: Self-pay | Admitting: Surgery

## 2024-08-23 DIAGNOSIS — C50912 Malignant neoplasm of unspecified site of left female breast: Secondary | ICD-10-CM

## 2024-08-23 DIAGNOSIS — Z79890 Hormone replacement therapy (postmenopausal): Secondary | ICD-10-CM | POA: Diagnosis not present

## 2024-08-23 DIAGNOSIS — J45909 Unspecified asthma, uncomplicated: Secondary | ICD-10-CM | POA: Diagnosis not present

## 2024-08-23 DIAGNOSIS — G4730 Sleep apnea, unspecified: Secondary | ICD-10-CM | POA: Diagnosis not present

## 2024-08-23 DIAGNOSIS — Z90710 Acquired absence of both cervix and uterus: Secondary | ICD-10-CM | POA: Insufficient documentation

## 2024-08-23 DIAGNOSIS — D0512 Intraductal carcinoma in situ of left breast: Secondary | ICD-10-CM | POA: Insufficient documentation

## 2024-08-23 DIAGNOSIS — D171 Benign lipomatous neoplasm of skin and subcutaneous tissue of trunk: Secondary | ICD-10-CM | POA: Insufficient documentation

## 2024-08-23 DIAGNOSIS — Z803 Family history of malignant neoplasm of breast: Secondary | ICD-10-CM | POA: Insufficient documentation

## 2024-08-23 DIAGNOSIS — E039 Hypothyroidism, unspecified: Secondary | ICD-10-CM | POA: Diagnosis not present

## 2024-08-23 DIAGNOSIS — Z170 Estrogen receptor positive status [ER+]: Secondary | ICD-10-CM

## 2024-08-23 DIAGNOSIS — C50412 Malignant neoplasm of upper-outer quadrant of left female breast: Secondary | ICD-10-CM

## 2024-08-23 HISTORY — PX: BREAST LUMPECTOMY WITH RADIO FREQUENCY LOCALIZER: SHX6897

## 2024-08-23 HISTORY — PX: MASS EXCISION: SHX2000

## 2024-08-23 HISTORY — PX: AXILLARY SENTINEL NODE BIOPSY: SHX5738

## 2024-08-23 SURGERY — BREAST LUMPECTOMY WITH RADIO FREQUENCY LOCALIZER
Anesthesia: General | Laterality: Right

## 2024-08-23 MED ORDER — PROPOFOL 500 MG/50ML IV EMUL
INTRAVENOUS | Status: DC | PRN
  Administered 2024-08-23: 150 ug/kg/min via INTRAVENOUS

## 2024-08-23 MED ORDER — KETOROLAC TROMETHAMINE 30 MG/ML IJ SOLN
INTRAMUSCULAR | Status: DC | PRN
  Administered 2024-08-23: 15 mg via INTRAVENOUS

## 2024-08-23 MED ORDER — MIDAZOLAM HCL 2 MG/2ML IJ SOLN
INTRAMUSCULAR | Status: DC | PRN
Start: 2024-08-23 — End: 2024-08-23
  Administered 2024-08-23: 2 mg via INTRAVENOUS

## 2024-08-23 MED ORDER — GABAPENTIN 300 MG PO CAPS
ORAL_CAPSULE | ORAL | Status: AC
Start: 2024-08-23 — End: 2024-08-23
  Filled 2024-08-23: qty 1

## 2024-08-23 MED ORDER — DEXAMETHASONE SODIUM PHOSPHATE 10 MG/ML IJ SOLN
INTRAMUSCULAR | Status: DC | PRN
  Administered 2024-08-23: 10 mg via INTRAVENOUS

## 2024-08-23 MED ORDER — SODIUM CHLORIDE (PF) 0.9 % IJ SOLN
INTRAMUSCULAR | Status: AC
  Filled 2024-08-23: qty 10

## 2024-08-23 MED ORDER — CHLORHEXIDINE GLUCONATE CLOTH 2 % EX PADS: 6.0000 | MEDICATED_PAD | Freq: Once | CUTANEOUS | Status: DC

## 2024-08-23 MED ORDER — DEXAMETHASONE SODIUM PHOSPHATE 10 MG/ML IJ SOLN
INTRAMUSCULAR | Status: AC
  Filled 2024-08-23: qty 1

## 2024-08-23 MED ORDER — ACETAMINOPHEN 500 MG PO TABS
1000.0000 mg | ORAL_TABLET | ORAL | Status: AC
  Administered 2024-08-23: 1000 mg via ORAL

## 2024-08-23 MED ORDER — OXYCODONE HCL 5 MG PO TABS: 5.0000 mg | ORAL_TABLET | Freq: Once | ORAL | Status: DC | PRN

## 2024-08-23 MED ORDER — ACETAMINOPHEN 500 MG PO TABS
ORAL_TABLET | ORAL | Status: AC
  Filled 2024-08-23: qty 2

## 2024-08-23 MED ORDER — OXYCODONE HCL 5 MG PO TABS
ORAL_TABLET | ORAL | Status: AC
Start: 2024-08-23 — End: 2024-08-23
  Filled 2024-08-23: qty 1

## 2024-08-23 MED ORDER — OXYCODONE HCL 5 MG PO TABS: 5.0000 mg | ORAL_TABLET | ORAL | 0 refills | Status: DC | PRN

## 2024-08-23 MED ORDER — ONDANSETRON HCL 4 MG/2ML IJ SOLN
INTRAMUSCULAR | Status: AC
Start: 2024-08-23 — End: 2024-08-23
  Filled 2024-08-23: qty 2

## 2024-08-23 MED ORDER — IBUPROFEN 600 MG PO TABS
600.0000 mg | ORAL_TABLET | Freq: Three times a day (TID) | ORAL | 1 refills | Status: AC | PRN
Start: 2024-08-23 — End: ?

## 2024-08-23 MED ORDER — FENTANYL CITRATE (PF) 100 MCG/2ML IJ SOLN
INTRAMUSCULAR | Status: AC
  Filled 2024-08-23: qty 2

## 2024-08-23 MED ORDER — CEFAZOLIN SODIUM-DEXTROSE 2-4 GM/100ML-% IV SOLN
2.0000 g | INTRAVENOUS | Status: AC
  Administered 2024-08-23: 2 g via INTRAVENOUS

## 2024-08-23 MED ORDER — DEXMEDETOMIDINE HCL IN NACL 80 MCG/20ML IV SOLN
INTRAVENOUS | Status: DC | PRN
  Administered 2024-08-23: 4 ug via INTRAVENOUS
  Administered 2024-08-23: 8 ug via INTRAVENOUS

## 2024-08-23 MED ORDER — CHLORHEXIDINE GLUCONATE 0.12 % MT SOLN
15.0000 mL | Freq: Once | OROMUCOSAL | Status: AC
  Administered 2024-08-23: 15 mL via OROMUCOSAL

## 2024-08-23 MED ORDER — CHLORHEXIDINE GLUCONATE CLOTH 2 % EX PADS
6.0000 | MEDICATED_PAD | Freq: Once | CUTANEOUS | Status: AC
  Administered 2024-08-23: 6 via TOPICAL

## 2024-08-23 MED ORDER — MIDAZOLAM HCL 2 MG/2ML IJ SOLN
INTRAMUSCULAR | Status: AC
  Filled 2024-08-23: qty 2

## 2024-08-23 MED ORDER — PROPOFOL 10 MG/ML IV BOLUS
INTRAVENOUS | Status: AC
Start: 2024-08-23 — End: 2024-08-23
  Filled 2024-08-23: qty 20

## 2024-08-23 MED ORDER — CEFAZOLIN SODIUM-DEXTROSE 2-4 GM/100ML-% IV SOLN
INTRAVENOUS | Status: AC
Start: 2024-08-23 — End: 2024-08-23
  Filled 2024-08-23: qty 100

## 2024-08-23 MED ORDER — FENTANYL CITRATE (PF) 100 MCG/2ML IJ SOLN
INTRAMUSCULAR | Status: AC
Start: 2024-08-23 — End: 2024-08-23
  Filled 2024-08-23: qty 2

## 2024-08-23 MED ORDER — KETOROLAC TROMETHAMINE 30 MG/ML IJ SOLN
INTRAMUSCULAR | Status: AC
Start: 2024-08-23 — End: 2024-08-23
  Filled 2024-08-23: qty 1

## 2024-08-23 MED ORDER — TECHNETIUM TC 99M TILMANOCEPT KIT
1.0800 | PACK | Freq: Once | INTRAVENOUS | Status: AC | PRN
  Administered 2024-08-23: 1.08 via INTRADERMAL

## 2024-08-23 MED ORDER — BUPIVACAINE-EPINEPHRINE (PF) 0.25% -1:200000 IJ SOLN
INTRAMUSCULAR | Status: AC
  Filled 2024-08-23: qty 30

## 2024-08-23 MED ORDER — ACETAMINOPHEN 10 MG/ML IV SOLN
1000.0000 mg | Freq: Once | INTRAVENOUS | Status: DC | PRN
Start: 2024-08-23 — End: 2024-08-23

## 2024-08-23 MED ORDER — CELECOXIB 200 MG PO CAPS: 200.0000 mg | ORAL_CAPSULE | Freq: Once | ORAL | Status: DC

## 2024-08-23 MED ORDER — PROPOFOL 1000 MG/100ML IV EMUL
INTRAVENOUS | Status: AC
  Filled 2024-08-23: qty 100

## 2024-08-23 MED ORDER — METHYLENE BLUE (ANTIDOTE) 1 % IV SOLN
INTRAVENOUS | Status: DC | PRN
  Administered 2024-08-23: 5 mL via INTRADERMAL

## 2024-08-23 MED ORDER — GABAPENTIN 300 MG PO CAPS
300.0000 mg | ORAL_CAPSULE | ORAL | Status: AC
  Administered 2024-08-23: 300 mg via ORAL

## 2024-08-23 MED ORDER — DROPERIDOL 2.5 MG/ML IJ SOLN: 0.6250 mg | Freq: Once | INTRAMUSCULAR | Status: DC | PRN

## 2024-08-23 MED ORDER — ACETAMINOPHEN 500 MG PO TABS: 1000.0000 mg | ORAL_TABLET | Freq: Four times a day (QID) | ORAL | Status: AC | PRN

## 2024-08-23 MED ORDER — FENTANYL CITRATE (PF) 100 MCG/2ML IJ SOLN
INTRAMUSCULAR | Status: DC | PRN
  Administered 2024-08-23: 50 ug via INTRAVENOUS
  Administered 2024-08-23 (×2): 25 ug via INTRAVENOUS

## 2024-08-23 MED ORDER — CELECOXIB 200 MG PO CAPS
ORAL_CAPSULE | ORAL | Status: AC
  Filled 2024-08-23: qty 1

## 2024-08-23 MED ORDER — LACTATED RINGERS IV SOLN: INTRAVENOUS | Status: DC

## 2024-08-23 MED ORDER — LIDOCAINE HCL (PF) 2 % IJ SOLN
INTRAMUSCULAR | Status: AC
  Filled 2024-08-23: qty 5

## 2024-08-23 MED ORDER — PROPOFOL 10 MG/ML IV BOLUS
INTRAVENOUS | Status: DC | PRN
  Administered 2024-08-23: 30 mg via INTRAVENOUS
  Administered 2024-08-23: 200 mg via INTRAVENOUS
  Administered 2024-08-23: 60 mg via INTRAVENOUS
  Administered 2024-08-23: 20 mg via INTRAVENOUS
  Administered 2024-08-23: 50 mg via INTRAVENOUS
  Administered 2024-08-23: 60 mg via INTRAVENOUS

## 2024-08-23 MED ORDER — PROPOFOL 10 MG/ML IV BOLUS
INTRAVENOUS | Status: AC
  Filled 2024-08-23: qty 20

## 2024-08-23 MED ORDER — LIDOCAINE HCL (CARDIAC) PF 100 MG/5ML IV SOSY
PREFILLED_SYRINGE | INTRAVENOUS | Status: DC | PRN
  Administered 2024-08-23: 100 mg via INTRAVENOUS

## 2024-08-23 MED ORDER — ORAL CARE MOUTH RINSE: 15.0000 mL | Freq: Once | OROMUCOSAL | Status: AC

## 2024-08-23 MED ORDER — ONDANSETRON HCL 4 MG/2ML IJ SOLN
INTRAMUSCULAR | Status: DC | PRN
  Administered 2024-08-23: 4 mg via INTRAVENOUS

## 2024-08-23 MED ORDER — FENTANYL CITRATE (PF) 100 MCG/2ML IJ SOLN
25.0000 ug | INTRAMUSCULAR | Status: DC | PRN
  Administered 2024-08-23 (×4): 25 ug via INTRAVENOUS

## 2024-08-23 MED ORDER — BUPIVACAINE-EPINEPHRINE (PF) 0.25% -1:200000 IJ SOLN
INTRAMUSCULAR | Status: DC | PRN
  Administered 2024-08-23: 24 mL
  Administered 2024-08-23: 6 mL

## 2024-08-23 MED ORDER — OXYCODONE HCL 5 MG/5ML PO SOLN: 5.0000 mg | Freq: Once | ORAL | Status: DC | PRN

## 2024-08-23 MED ORDER — METHYLENE BLUE (ANTIDOTE) 1 % IV SOLN
INTRAVENOUS | Status: AC
  Filled 2024-08-23: qty 10

## 2024-08-23 MED ORDER — CHLORHEXIDINE GLUCONATE 0.12 % MT SOLN
OROMUCOSAL | Status: AC
  Filled 2024-08-23: qty 15

## 2024-08-23 SURGICAL SUPPLY — 38 items
BINDER BREAST LRG (GAUZE/BANDAGES/DRESSINGS) IMPLANT
BINDER BREAST MEDIUM (GAUZE/BANDAGES/DRESSINGS) IMPLANT
BLADE PHOTON ILLUMINATED (MISCELLANEOUS) ×2 IMPLANT
BLADE SURG 15 STRL LF DISP TIS (BLADE) ×2 IMPLANT
CHLORAPREP W/TINT 26 (MISCELLANEOUS) IMPLANT
CLIP APPLIE 9.375 SM OPEN (CLIP) IMPLANT
COVER PROBE GAMMA FINDER SLV (MISCELLANEOUS) ×2 IMPLANT
DERMABOND ADVANCED .7 DNX12 (GAUZE/BANDAGES/DRESSINGS) ×2 IMPLANT
DEVICE DUBIN SPECIMEN MAMMOGRA (MISCELLANEOUS) ×2 IMPLANT
DRAPE LAPAROTOMY 100X77 ABD (DRAPES) ×2 IMPLANT
DRAPE LAPAROTOMY 77X122 PED (DRAPES) IMPLANT
DRSG GAUZE FLUFF 36X18 (GAUZE/BANDAGES/DRESSINGS) ×2 IMPLANT
ELECTRODE CAUTERY BLDE TIP 2.5 (TIP) ×2 IMPLANT
ELECTRODE REM PT RTRN 9FT ADLT (ELECTROSURGICAL) ×2 IMPLANT
GAUZE 4X4 16PLY ~~LOC~~+RFID DBL (SPONGE) IMPLANT
GLOVE SURG SYN 7.0 PF PI (GLOVE) ×2 IMPLANT
GLOVE SURG SYN 7.5 PF PI (GLOVE) ×2 IMPLANT
GOWN STRL REUS W/ TWL LRG LVL3 (GOWN DISPOSABLE) ×4 IMPLANT
KIT MARKER MARGIN INK (KITS) IMPLANT
KIT TURNOVER KIT A (KITS) ×2 IMPLANT
LABEL OR SOLS (LABEL) ×2 IMPLANT
MANIFOLD NEPTUNE II (INSTRUMENTS) ×2 IMPLANT
NDL HYPO 22X1.5 SAFETY MO (MISCELLANEOUS) ×2 IMPLANT
NDL SAFETY ECLIPSE 18X1.5 (NEEDLE) ×2 IMPLANT
NEEDLE HYPO 22X1.5 SAFETY MO (MISCELLANEOUS) ×2 IMPLANT
NS IRRIG 1000ML POUR BTL (IV SOLUTION) ×2 IMPLANT
PACK BASIN MINOR ARMC (MISCELLANEOUS) ×2 IMPLANT
SHEATH BREAST BIOPSY SKIN MKR (SHEATH) ×2 IMPLANT
SUT SILK 3 0 SH 30 (SUTURE) IMPLANT
SUT VIC AB 3-0 SH 27X BRD (SUTURE) ×2 IMPLANT
SUTURE EHLN 3-0 FS-10 30 BLK (SUTURE) IMPLANT
SUTURE MNCRL 4-0 27XMF (SUTURE) ×2 IMPLANT
SYR 10ML LL (SYRINGE) ×2 IMPLANT
TAPE TRANSPORE STRL 2 31045 (GAUZE/BANDAGES/DRESSINGS) IMPLANT
TRAP FLUID SMOKE EVACUATOR (MISCELLANEOUS) ×2 IMPLANT
TRAP NEPTUNE SPECIMEN COLLECT (MISCELLANEOUS) ×2 IMPLANT
WATER STERILE IRR 1000ML POUR (IV SOLUTION) ×2 IMPLANT
WATER STERILE IRR 500ML POUR (IV SOLUTION) ×2 IMPLANT

## 2024-08-23 NOTE — Interval H&P Note (Signed)
 History and Physical Interval Note:  08/23/2024 11:53 AM  Angela Pacheco  has presented today for surgery, with the diagnosis of Left breast Cancer chest wall lipoma.  The various methods of treatment have been discussed with the patient and family. After consideration of risks, benefits and other options for treatment, the patient has consented to  Procedure(s): BREAST LUMPECTOMY WITH RADIO FREQUENCY LOCALIZER (Left) BIOPSY, LYMPH NODE, SENTINEL, AXILLARY (Left) EXCISION, MASS, CHEST WALL (Right) as a surgical intervention.  The patient's history has been reviewed, patient examined, no change in status, stable for surgery.  I have reviewed the patient's chart and labs.  Questions were answered to the patient's satisfaction.     [name]

## 2024-08-23 NOTE — Op Note (Signed)
 Procedure Date:  08/23/2024  Pre-operative Diagnosis:  Left breast cancer; right chest wall lipoma  Post-operative Diagnosis: Left breast cancer; right chest wall lipoma 2.5 cm.  Procedure:  Left breast SAVI scout tag-localized lumpectomy and sentinel lymph node biopsy; right chest wall lipoma excision.  Surgeon:  Aloysius Sheree Plant, MD  Anesthesia:  General endotracheal  Estimated Blood Loss:  20 ml  Specimens:   Right chest wall lipoma Left axillary sentinel lymph node #1 -- count 1529 Left axillary sentinel lymph node #2 -- count 683 Left breast lumpectomy Left breast new superior margin  Complications:  None  Operation performed with curative intent:Yes  Tracer(s) used to identify sentinel nodes in the upfront surgery (non-neoadjuvant) setting (select all that apply):Dye and Radioactive Tracer  Tracer(s) used to identify sentinel nodes in the neoadjuvant setting (select all that apply):N/A  All nodes (colored or non-colored) present at the end of a dye-filled lymphatic channel were removed:Yes   All significantly radioactive nodes were removed:Yes  All palpable suspicious nodes were removed:N/A  Biopsy-proven positive nodes marked with clips prior to chemotherapy were identified and removed:N/A  Indications for Procedure:  This is a 58 y.o. female who presents with newly diagnosed left breast cancer.  She also has a right chest wall lipoma that has been bothersome.  The risks of bleeding, infection, injury to surrounding structures, hematoma, seroma, open wound, cosmetic deformity, and the need for further surgery were all discussed with the patient and was willing to proceed.  Prior to this procedure, the patient had undergone SAVI scout tag localization and sentinel lymphoscintigraphy.  Description of Procedure: The patient was correctly identified in the preoperative area and brought into the operating room.  The patient was placed supine with VTE prophylaxis in place.   Appropriate time-outs were performed.  Anesthesia was induced and the patient was intubated.  Appropriate antibiotics were infused.  A visual dye was injected in the left periareolar region under aseptic conditions.  We started with the right chest wall lipoma.  The right chest wall was prepped and draped in usual sterile fashion.  A 3 cm incision was made overlying the lipoma, and cautery was used to dissect to the subcutaneous layer and to excise the lipoma.  The wound was irrigated and the wound was closed in layers using 3-0 Vicryl and 4-0 Monocryl.  The skin was cleaned and the incision sealed with DermaBond.  We then moved to the left axilla and breast.  The left chest and axilla were prepped and draped in usual sterile fashion.  Then using the hand-held probe an area of high counts was identified in the axilla, and a 5 cm incision was made.  Cautery was used to dissect down the subcutaneous tissue and the hand-held probe was used to guide dissection. A hot and blue lymph node was identified and resected.  This had a count of 1529.  An additional lymph node was identified and resected with count of 683.  The wound bed had a count of 45.  The cavity was irrigated and hemostasis was assured with electrocautery.  Local anesthetic was infiltrated into the skin and subcutaneous tissue of the cavity.  The wound was then closed in layers with 3-0 Vicryl and 4-0 Monocryl and sealed with DermaBond.  Attention was turned to the SAVI scout tag localization site which was determined using the SAVI scout probe.  An incision was made overlying the tag and clip.  SAVI scout probe was used to guide our dissection using electrocautery,  and a partial mastectomy was performed.  MarginMarker was used to ink each of the sides of the specimen.  The specimen was then imaged to confirm that the area of concern, biopsy clip, and SAVI scout tag were included in the excision.  This was then sent to pathology.  The superior margin  was 1 mm, so as a precaution, an additional superior margin was obtained and inked using MarginMarker as well.  This was sent to pathology.  The cavity was irrigated and hemostasis was assured with electrocautery.  Local anesthetic was infiltrated into the skin and subcutaneous tissue of the cavity.  The wound was then closed in multiple layers with 3-0 Vicryl and 4-0 Monocryl and sealed with DermaBond.  The patient was emerged from anesthesia and extubated and brought to the recovery room for further management.  The patient tolerated the procedure well and all counts were correct at the end of the case.   Aloysius Sheree Plant, MD

## 2024-08-23 NOTE — Discharge Instructions (Signed)
 Discharge Instructions: 1.  Patient may shower, but do not scrub wounds heavily and dab dry only. 2.  Do not submerge wounds in pool/tub until fully healed. 3.  Do not apply ointments or hydrogen peroxide to the wounds. 4.  May apply ice packs to the wounds for comfort. 5.  May apply fluffed gauze dressing over the incisions in the axilla and breast to help with padding/comfort. 6.  Please wear breast binder at all times for the next two weeks.  May remove for showers or washing. 7.  Do not drive while taking narcotics for pain control.  Prior to driving, make sure you are able to rotate right and left to look at blindspots without significant pain or discomfort. 8.  Avoid strenuous activity with the left arm for two weeks.

## 2024-08-23 NOTE — Anesthesia Preprocedure Evaluation (Addendum)
 Anesthesia Evaluation  Patient identified by MRN, date of birth, ID band Patient awake    Reviewed: Allergy  & Precautions, H&P , NPO status , Patient's Chart, lab work & pertinent test results  Airway Mallampati: II  TM Distance: >3 FB Neck ROM: full    Dental no notable dental hx.    Pulmonary asthma , sleep apnea    Pulmonary exam normal        Cardiovascular negative cardio ROS Normal cardiovascular exam     Neuro/Psych negative neurological ROS  negative psych ROS   GI/Hepatic negative GI ROS, Neg liver ROS,,,  Endo/Other  Hypothyroidism    Renal/GU      Musculoskeletal   Abdominal   Peds  Hematology negative hematology ROS (+)   Anesthesia Other Findings Past Medical History: No date: Allergy      Comment:  cats trigger June 2022: Arthritis Years ago: Asthma     Comment:  mild 05/06/2019: B12 deficiency No date: Complication of anesthesia 11/01/2017: Dysplastic nevus     Comment:  Right middle shoulder, mod, excision by Dr Arlyss 07/03/2020: Dysplastic nevus     Comment:  L medial scapular back, lat to T5, moderate, excision by              Dr Arlyss 06/09/2020: Dysplastic nevus     Comment:  L scapular back lateral, mild 01/31/2024: Hypercholesteremia No date: Hypothyroidism 08/13/2024: Malignant neoplasm of upper-outer quadrant of left breast  in female, estrogen receptor positive (HCC) 2023: Oxygen deficiency     Comment:  Use mouth sleep appliance. Respire No date: PONV (postoperative nausea and vomiting) 2023: Sleep apnea 1998: Thyroid  disease  Past Surgical History: No date: ABDOMINAL HYSTERECTOMY     Comment:  cervix left in place 2017: ANTERIOR CRUCIATE LIGAMENT REPAIR; Left 1991: APPENDECTOMY 08/03/2024: BREAST BIOPSY; Left     Comment:  US  LT BREAST BX W LOC DEV 1ST LESION IMG BX SPEC US                GUIDE 08/03/2024 [facility] 08/15/2024: BREAST BIOPSY; Left     Comment:  US   LT BREAST SAVI/RF TAG 1ST LESION US  GUIDE 08/15/2024               [facility] No date: CHOLECYSTECTOMY 10/08/2019: COLONOSCOPY WITH PROPOFOL ; N/A     Comment:  Procedure: COLONOSCOPY WITH PROPOFOL ;  Surgeon: Unk Corinn Skiff, MD;  Location: [facility];  Service:               Gastroenterology;  Laterality: N/A;     Reproductive/Obstetrics negative OB ROS                              Anesthesia Physical Anesthesia Plan  ASA: 2  Anesthesia Plan: General LMA   Post-op Pain Management: Regional block*, Gabapentin  PO (pre-op)*, Toradol  IV (intra-op)* and Tylenol  PO (pre-op)*   Induction: Intravenous  PONV Risk Score and Plan: Dexamethasone , Ondansetron , Midazolam  and Propofol  infusion  Airway Management Planned: LMA  Additional Equipment:   Intra-op Plan:   Post-operative Plan: Extubation in OR  Informed Consent: I have reviewed the patients History and Physical, chart, labs and discussed the procedure including the risks, benefits and alternatives for the proposed anesthesia with the patient or authorized representative who has indicated his/her understanding and acceptance.     Dental Advisory Given  Plan Discussed with: Anesthesiologist, CRNA  and Surgeon  Anesthesia Plan Comments:          Anesthesia Quick Evaluation

## 2024-08-23 NOTE — Transfer of Care (Signed)
 Immediate Anesthesia Transfer of Care Note  Patient: Angela Pacheco  Procedure(s) Performed: BREAST LUMPECTOMY WITH RADIO FREQUENCY LOCALIZER (Left) BIOPSY, LYMPH NODE, SENTINEL, AXILLARY (Left) EXCISION, MASS, CHEST WALL (Right)  Patient Location: PACU  Anesthesia Type:General  Level of Consciousness: sedated and drowsy  Airway & Oxygen Therapy: Patient Spontanous Breathing and Patient connected to nasal cannula oxygen  Post-op Assessment: Report given to RN and Post -op Vital signs reviewed and stable  Post vital signs: Reviewed and stable  Last Vitals:  Vitals Value Taken Time  BP 121/82 08/23/24 15:19  Temp 36.1 C 08/23/24 15:19  Pulse 74 08/23/24 15:22  Resp 10 08/23/24 15:22  SpO2 100 % 08/23/24 15:22  Vitals shown include unfiled device data.  Last Pain:  Vitals:   08/23/24 1146  TempSrc: Temporal  PainSc: 0-No pain         Complications: No notable events documented.

## 2024-08-23 NOTE — Anesthesia Postprocedure Evaluation (Signed)
 Anesthesia Post Note  Patient: Angela Pacheco  Procedure(s) Performed: BREAST LUMPECTOMY WITH RADIO FREQUENCY LOCALIZER (Left) BIOPSY, LYMPH NODE, SENTINEL, AXILLARY (Left) EXCISION, MASS, CHEST WALL (Right)  Patient location during evaluation: PACU Anesthesia Type: General Level of consciousness: awake and alert Pain management: pain level controlled Vital Signs Assessment: post-procedure vital signs reviewed and stable Respiratory status: spontaneous breathing, nonlabored ventilation and respiratory function stable Cardiovascular status: blood pressure returned to baseline and stable Postop Assessment: no apparent nausea or vomiting Anesthetic complications: no   No notable events documented.   Last Vitals:  Vitals:   08/23/24 1630 08/23/24 1643  BP: 125/72 106/67  Pulse: 78 72  Resp: 17 16  Temp:  (!) 36.1 C  SpO2: 97% 100%    Last Pain:  Vitals:   08/23/24 1643  TempSrc: Temporal  PainSc: 4                  [name]

## 2024-08-24 ENCOUNTER — Encounter: Payer: Self-pay | Admitting: Surgery

## 2024-08-29 ENCOUNTER — Encounter: Payer: Self-pay | Admitting: *Deleted

## 2024-08-29 ENCOUNTER — Ambulatory Visit: Payer: Self-pay | Admitting: Surgery

## 2024-08-29 ENCOUNTER — Inpatient Hospital Stay (HOSPITAL_BASED_OUTPATIENT_CLINIC_OR_DEPARTMENT_OTHER): Admitting: Hospice and Palliative Medicine

## 2024-08-29 ENCOUNTER — Other Ambulatory Visit: Payer: Self-pay | Admitting: Pathology

## 2024-08-29 ENCOUNTER — Encounter: Admitting: Licensed Clinical Social Worker

## 2024-08-29 DIAGNOSIS — C50412 Malignant neoplasm of upper-outer quadrant of left female breast: Secondary | ICD-10-CM | POA: Insufficient documentation

## 2024-08-29 DIAGNOSIS — Z801 Family history of malignant neoplasm of trachea, bronchus and lung: Secondary | ICD-10-CM | POA: Insufficient documentation

## 2024-08-29 DIAGNOSIS — Z170 Estrogen receptor positive status [ER+]: Secondary | ICD-10-CM | POA: Insufficient documentation

## 2024-08-29 DIAGNOSIS — Z800 Family history of malignant neoplasm of digestive organs: Secondary | ICD-10-CM | POA: Insufficient documentation

## 2024-08-29 DIAGNOSIS — Z808 Family history of malignant neoplasm of other organs or systems: Secondary | ICD-10-CM | POA: Insufficient documentation

## 2024-08-29 DIAGNOSIS — E039 Hypothyroidism, unspecified: Secondary | ICD-10-CM | POA: Insufficient documentation

## 2024-08-29 DIAGNOSIS — Z803 Family history of malignant neoplasm of breast: Secondary | ICD-10-CM | POA: Insufficient documentation

## 2024-08-29 LAB — SURGICAL PATHOLOGY

## 2024-08-29 NOTE — Progress Notes (Signed)
 08/29/24 Called patient to discuss pathology results.  Overall her two lymph nodes biopsied are negative for malignancy.  From the lumpectomy standpoint, all margins are clear.  Patient is doing well and reports some soreness but otherwise no issues.  Results also forwarded to Dr. Melanee.  Follow up with me as scheduled.  Aloysius Plant, MD

## 2024-08-29 NOTE — Progress Notes (Signed)
 Oncotype order [id number] submitted online.

## 2024-08-29 NOTE — Progress Notes (Signed)
 Multidisciplinary Oncology Council Documentation  Angela Pacheco was presented by our [facility] on 08/29/2024, which included representatives from:  Palliative Care Dietitian  Physical/Occupational Therapist Nurse Navigator Genetics Social work Survivorship RN Financial Navigator Research RN   Angela Pacheco currently presents with history of breast cancer  We reviewed previous medical and familial history, history of present illness, and recent lab results along with all available histopathologic and imaging studies. The MOC considered available treatment options and made the following recommendations/referrals:  Rehab screening  The MOC is a meeting of clinicians from various specialty areas who evaluate and discuss patients for whom a multidisciplinary approach is being considered. Final determinations in the plan of care are those of the provider(s).   Today's extended care, comprehensive team conference, Angela Pacheco was not present for the discussion and was not examined.

## 2024-08-31 ENCOUNTER — Other Ambulatory Visit: Payer: Self-pay | Admitting: Internal Medicine

## 2024-09-03 ENCOUNTER — Encounter: Payer: Self-pay | Admitting: Surgery

## 2024-09-03 ENCOUNTER — Other Ambulatory Visit

## 2024-09-03 NOTE — Progress Notes (Signed)
 Tumor Board Documentation  Edwin Cherian was presented by Dr. Melanee at our Tumor Board on 09/03/2024, which included representatives from medical oncology, radiation oncology, surgical, radiology, pathology, navigation, genetics, research.  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  Additionally, we reviewed previous medical and familial history, history of present illness, and recent lab results along with all available histopathologic and imaging studies. The tumor board considered available treatment options and made the following recommendations: Adjuvant radiation, Hormonal therapy    The following procedures/referrals were also placed: No orders of the defined types were placed in this encounter.   Clinical Trial Status: not discussed   Staging used:    National site-specific guidelines   were discussed with respect to the case.  Tumor board is a meeting of clinicians from various specialty areas who evaluate and discuss patients for whom a multidisciplinary approach is being considered. Final determinations in the plan of care are those of the provider(s). The responsibility for follow up of recommendations given during tumor board is that of the provider.   Today's extended care, comprehensive team conference, Reighan was not present for the discussion and was not examined.   Multidisciplinary Tumor Board is a multidisciplinary case peer review process.  Decisions discussed in the Multidisciplinary Tumor Board reflect the opinions of the specialists present at the conference without having examined the patient.  Ultimately, treatment and diagnostic decisions rest with the primary provider(s) and the patient.

## 2024-09-05 ENCOUNTER — Encounter: Payer: Self-pay | Admitting: Surgery

## 2024-09-05 ENCOUNTER — Institutional Professional Consult (permissible substitution): Admitting: Radiation Oncology

## 2024-09-05 ENCOUNTER — Ambulatory Visit (INDEPENDENT_AMBULATORY_CARE_PROVIDER_SITE_OTHER): Admitting: Surgery

## 2024-09-05 VITALS — BP 116/81 | HR 70 | Temp 97.9°F | Ht 60.0 in | Wt 117.0 lb

## 2024-09-05 DIAGNOSIS — D171 Benign lipomatous neoplasm of skin and subcutaneous tissue of trunk: Secondary | ICD-10-CM

## 2024-09-05 DIAGNOSIS — Z170 Estrogen receptor positive status [ER+]: Secondary | ICD-10-CM

## 2024-09-05 DIAGNOSIS — Z09 Encounter for follow-up examination after completed treatment for conditions other than malignant neoplasm: Secondary | ICD-10-CM

## 2024-09-05 DIAGNOSIS — C50412 Malignant neoplasm of upper-outer quadrant of left female breast: Secondary | ICD-10-CM

## 2024-09-05 NOTE — Progress Notes (Signed)
 09/05/2024  HPI: Angela Pacheco is a 58 y.o. female s/p left breast lumpectomy and SLNBx and right chest wall lipoma excision on 08/23/24.  Patient presents for follow up.  She reports that the right chest wall incision has opened up.  Denies any worsening pain or purulence.  No issues with the left breast/axillary incisions but just reports soreness.  Vital signs: BP 116/81   Pulse 70   Temp 97.9 F (36.6 C) (Oral)   Ht 5' (1.524 m)   Wt 117 lb (53.1 kg)   BMI 22.85 kg/m    Physical Exam: Constitutional:  No acute distress Breast:  Left breast lumpectomy incision is healing well and is clean, dry, intact.  Minimal ecchymosis that is resolving.  Left axillary incision is also healing well and is clean, dry, intact.   Skin:  Right chest wall incision has dehisced, with exposed monocryl suture which was removed.  Has fibrinous tissue at the edge which was debrided sharply using scissors.  The wound was packed with wet to dry 2x2 gauze.  Assessment/Plan: This is a 58 y.o. female s/p left breast lumpectomy and SLNBx and right chest wall lipoma excision  --Patient's right chest wall incision was debrided and cleaned.  No necrosis or purulence.  The wound was packed and dressing instructions given to her and her husband.  Continue this until wound is healed. --From breast standpoint, she's doing well.  Discussed again pathology results.  Oncotype is pending and she has follow up with Dr. Lenn and Dr. Melanee next week. --Follow up in 2 weeks to check on her chest wound.   Aloysius Sheree Plant, MD [facility]

## 2024-09-05 NOTE — Patient Instructions (Addendum)
 Pack your wound daily with a gauze, apply some saline to the gauze. You may take a shower normally but be sure you remove the gauze, when you get out place a new gauze in the area. If you have any questions or concerns please don't hesitate to call the office.  You can return to work on Monday September 15th 2025  GENERAL POST-OPERATIVE PATIENT INSTRUCTIONS   WOUND CARE INSTRUCTIONS:  Keep a dry clean dressing on the wound if there is drainage. The initial bandage may be removed after 24 hours.  Once the wound has quit draining you may leave it open to air.  If clothing rubs against the wound or causes irritation and the wound is not draining you may cover it with a dry dressing during the daytime.  Try to keep the wound dry and avoid ointments on the wound unless directed to do so.  If the wound becomes bright red and painful or starts to drain infected material that is not clear, please contact your physician immediately.  If the wound is mildly pink and has a thick firm ridge underneath it, this is normal, and is referred to as a healing ridge.  This will resolve over the next 4-6 weeks.  BATHING: You may shower if you have been informed of this by your surgeon. However, Please do not submerge in a tub, hot tub, or pool until incisions are completely sealed or have been told by your surgeon that you may do so.  DIET:  You may eat any foods that you can tolerate.  It is a good idea to eat a high fiber diet and take in plenty of fluids to prevent constipation.  If you do become constipated you may want to take a mild laxative or take ducolax tablets on a daily basis until your bowel habits are regular.  Constipation can be very uncomfortable, along with straining, after recent surgery.  ACTIVITY:  You are encouraged to cough and deep breath or use your incentive spirometer if you were given one, every 15-30 minutes when awake.  This will help prevent respiratory complications and low grade fevers  post-operatively if you had a general anesthetic.  You may want to hug a pillow when coughing and sneezing to add additional support to the surgical area, if you had abdominal or chest surgery, which will decrease pain during these times.  You are encouraged to walk and engage in light activity for the next two weeks.  You should not lift more than 20 pounds for 6 weeks total after surgery as it could put you at increased risk for complications.  Twenty pounds is roughly equivalent to a plastic bag of groceries. At that time- Listen to your body when lifting, if you have pain when lifting, stop and then try again in a few days. Soreness after doing exercises or activities of daily living is normal as you get back in to your normal routine.  MEDICATIONS:  Try to take narcotic medications and anti-inflammatory medications, such as tylenol , ibuprofen , naprosyn, etc., with food.  This will minimize stomach upset from the medication.  Should you develop nausea and vomiting from the pain medication, or develop a rash, please discontinue the medication and contact your physician.  You should not drive, make important decisions, or operate machinery when taking narcotic pain medication.  SUNBLOCK Use sun block to incision area over the next year if this area will be exposed to sun. This helps decrease scarring and will allow you  avoid a permanent darkened area over your incision.  QUESTIONS:  Please feel free to call our office if you have any questions, and we will be glad to assist you. [phone]

## 2024-09-06 ENCOUNTER — Telehealth: Payer: Self-pay | Admitting: *Deleted

## 2024-09-06 NOTE — Telephone Encounter (Signed)
 Faxed FMLA to [employer] at [phone]

## 2024-09-07 NOTE — Progress Notes (Signed)
 Chief Complaint  Patient presents with  . New Patient    History of present illness:   I have been asked to see this patient in consultation by Glendia Allena RAMAN, Md [address] [place],  [place] [address]  Angela Pacheco is a 58 y.o.female who presents today for evaluation of polyarthralgia.   Pertinent positive labs include: None Pertinent negative labs include: ANA direct, RF, anti-CCP, ESR, CRP, HLA-B27  Pertinent medical history: asthma, hypothyroidism, DCIS s/p lumpectomy Pertinent medications: protonix , synthroid   Today She reports joint pains in her hand and wrists and knees and elbows and toes that onset late June 2025. She notes that this is unaffected by rest or activity and improved by antiinflammatories. She reports morning stiffness lasting about 2 hours, worst in her hands. She is undergoing hand therapy with [facility]. She notes she has noticed some joint swelling in her toes. She notes she was given a steroid taper in July and that it helped quite a bit. She notes when she has a flare her pain stays all day. She has used ibuprofen  and tylenol  which as helped with post-lumpectomy pains. She notes she eats gluten free due to gluten intolerance in which she notices increased joint pains and some fatigue and headache. She notes weight lifting worsens her hand pains.   She notes a history of prior ANA positivity but negative retesting.   She has a recent DCIS diagnosis with lumpectomy and ongoing radiation and pending treatment with estrogen blockers.  Psoriasis: Not personally Uveitis: No Dactylitis: No Achilles tendonitis: No, although she does endorse some plantar fasciiits.  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 She is a never smoker. She drinks an average of 0-1 servings  of alcohol weekly. Maevyn reports a family history of SLE in her niece, lupus anticoagulant positivity, psoriasis in her father and daughter, but denies any family history of known autoimmune disease otherwise.   Review of Systems Review of Systems  Constitutional:  Positive for malaise/fatigue and weight loss (Intentional 25lb). Negative for fever.  HENT:  Positive for hearing loss and tinnitus.   Respiratory:  Negative for cough, shortness of breath and wheezing.   Cardiovascular:  Negative for chest pain, palpitations and leg swelling.  Gastrointestinal:  Positive for heartburn. Negative for blood in stool, constipation, diarrhea, nausea and vomiting.  Genitourinary:  Negative for dysuria and hematuria.  Musculoskeletal:  Positive for joint pain and myalgias. Negative for back pain and neck pain.  Skin:  Negative for rash.  Neurological:  Negative for dizziness, weakness and headaches.  Psychiatric/Behavioral:  Negative for depression. The patient is not nervous/anxious and does not have insomnia.    ROS was negative except as noted above.  Patient Active Problem List   Diagnosis Date Noted  . DDD (degenerative disc disease), cervical 07/25/2024  . Right shoulder strain 06/08/2024  . Muscle strain of right scapular region 06/08/2024  . COVID-19 virus infection 07/13/2021  . Right foot pain 04/03/2021  . UTI (urinary tract infection) 02/14/2021  . Impingement syndrome of left shoulder region 09/22/2020  . Tingling in extremities 09/14/2020  . Axillary fullness 06/18/2020  . Sinusitis, unspecified 03/16/2020  . Neck fullness 01/20/2019  . Positive ANA (antinuclear antibody) 10/04/2018  . Screening for osteoporosis 10/04/2018  . Family history of lupus erythematosus 10/04/2018  . Joint pain 09/15/2018  .  Immunity to measles determined by serologic test 06/30/2018  . Abnormal mammogram 07/07/2016  . Overweight 02/11/2016  . Asthma (HHS-HCC) 01/17/2015  . Endometriosis 01/17/2015   . Environmental allergies 01/17/2015  . Menopausal symptoms 01/17/2015  . Hypothyroidism 01/14/2015   Past Medical History:  Diagnosis Date  . Allergy    . ANA positive   . Asthma without status asthmaticus (HHS-HCC)   . B12 deficiency   . COVID-19   . Endometriosis   . Thyroid  disease    hashimotos   Past Surgical History:  Procedure Laterality Date  . HYSTERECTOMY  2000  . clavinectomy Left 10/2021  . acl reconstruction    . APPENDECTOMY    . CHOLECYSTECTOMY     Social History   Socioeconomic History  . Marital status: Married  Occupational History  . Occupation: Oncologist at [employer]  . Smoking status: Never  . Smokeless tobacco: Never  Vaping Use  . Vaping status: Never Used  Substance and Sexual Activity  . Alcohol use: Yes    Alcohol/week: 0.0 standard drinks of alcohol    Comment: occasionally  . Drug use: No  . Sexual activity: Yes    Partners: Male    Birth control/protection: Surgical   Social Drivers of Health   Financial Resource Strain: Low Risk  (09/13/2024)   Overall Financial Resource Strain (CARDIA)   . Difficulty of Paying Living Expenses: Not hard at all  Food Insecurity: No Food Insecurity (09/13/2024)   Hunger Vital Sign   . Worried About Programme researcher, broadcasting/film/video in the Last Year: Never true   . Ran Out of Food in the Last Year: Never true  Transportation Needs: No Transportation Needs (09/13/2024)   PRAPARE - Transportation   . Lack of Transportation (Medical): No   . Lack of Transportation (Non-Medical): No  Physical Activity: Insufficiently Active (01/31/2024)   Received from [facility]   Exercise Vital Sign   . On average, how many days per week do you engage in moderate to strenuous exercise (like a brisk walk)?: 3 days   . On average, how many minutes do you engage in exercise at this level?: 30 min  Stress: No Stress Concern Present (01/31/2024)   Received from [facility] - Occupational Stress Questionnaire   . Feeling of Stress : Only a little  Social Connections: Socially Integrated (01/31/2024)   Received [facility]   Social Connection and Isolation Panel   . In a typical week, how many times do you talk on the phone with family, friends, or neighbors?: More than three times a week   . How often do you get together with friends or relatives?: Once a week   . How often do you attend church or religious services?: More than 4 times per year   . Do you belong to any clubs or organizations such as church groups, unions, fraternal or athletic groups, or school groups?: Yes   . How often do you attend meetings of the clubs or organizations you belong to?: More than 4 times per year   . Are you married, widowed, divorced, separated, never married, or living with a partner?: Married  Housing Stability: Low Risk  (09/13/2024)   Housing Stability Vital Sign   . Unable to Pay for Housing in the Last Year: No   . Number of Times Moved in the Last Year: 0   . Homeless in the Last Year: No  Family History  Problem Relation Name Age of Onset  . Arthritis Mother    . Hyperlipidemia (Elevated cholesterol) Mother    . Thyroid  disease Father    . Cancer Father    . Hyperlipidemia (Elevated cholesterol) Sister    . Autoimmune disease Sister    . Cancer Maternal Grandmother    . Lupus Other Niece   . Breast cancer Neg Hx      Physical Exam: BP 118/83   Pulse 72   Temp 36.6 C (97.8 F)   Ht 152.4 cm (5')   Wt 54.9 kg (121 lb)   BMI 23.63 kg/m   General: Pleasant white woman sitting in chair. Well groomed, no acute distress. Non-toxic appearance.  HEENT: Conjunctivae normal.  Pulmonary: Normal effort of breathing. Symmetric chest expansion.  Musculoskeletal: Tenderness to palpation of DIPs, PIPs, MCPs, wrists, left elbow, brachioradiali, quadriceps tendons, patellar tendons, knees L>R, achilles tendons, right MTPs > left MTPs. Synovitis noted to S1  broadly over the PIPs of the hands worse on right. Some erythema without increased heat noted about the DIPs. Fist formation minimally impaired bilaterally. Grip strength decreased bilaterally. Positive squeeze tests to hands. No other tender, synovitic, warm, erythematous, or deformed joints. FROM all joints except as above.  Skin: Skin warm and dry. No rashes appreciable. Neurologic: Oriented to time, person, place, and situation.   Psychological: Normal behavior, thought content, and judgment. Records were reviewed Office Visit on 07/25/2024  Component Date Value Ref Range Status  . ANA Direct - [facility] 07/25/2024 Negative  Negative Final  . RA Latex Turbid. - [facility] 07/25/2024 <10.0  <14.0 IU/mL Final  . C Reactive Protein - [facility] 07/25/2024 <1  0 - 10 mg/L Final  . ASO - [facility] 07/25/2024 56.0  0.0 - 200.0 IU/mL Final  . Sedimentation Rate-Automated 07/25/2024 3  0 - 30 mm/hr Final  . CCP Antibodies IgG/IgA - [facility] 07/25/2024 6  0 - 19 units Final  . 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 For now we will label it seronegative spondyloarthritis and proceed with initial treatment efforts starting with sulfasalazine in order to minimize any disruptions or complications with her ongoing DCIS treatment with [facility]. If sulfasalazine were to fail, we would need to discuss possible methotrexate  start.   Risks and benefits of sulfasalazine reviewed.  She is not sulfa allergic that she knows of, but will stop the medication and inform me immediately if a rash develops (typically about a week after starting the medication).  Discussed common side effects, including GI symptoms, as well as serious side effects including allergic reaction, cytopenias, and elevated LFTs.  Questions were welcomed and answered.  Return in about 3 months (around 12/13/2024).     Medication List      * Accurate as of September 13, 2024  9:39 AM. If you have any questions, ask your nurse  or doctor.        START taking these medications   sulfaSALAzine 500 mg EC tablet Commonly known as: AZULFIDINE EN-TABS Take 1 tablet (500 mg total) by mouth once daily for 7 days, THEN 1 tablet (500 mg total) 2 (two) times daily for 7 days, THEN 2 tablets (1,000 mg total) 2 (two) times daily for 76 days. Take with food. Start taking on: September 13, 2024 Started by: ELSIE CONCH DEFOOR, PA     CHANGE how you take these medications   predniSONE  5 MG tablet Commonly known as: DELTASONE  Take 4 tablets (20 mg total) by mouth once daily for 3 days, THEN 3 tablets (15 mg total) once daily for 3 days, THEN 2 tablets (10 mg total) once daily for 3 days, THEN 1 tablet (5 mg total) once daily for 3 days. Take in the mornings. Start taking on: September 13, 2024 What changed:  medication strength See the new instructions. Changed by: ELSIE CONCH DEFOOR, PA     CONTINUE taking these medications   albuterol  MDI (PROVENTIL , VENTOLIN , PROAIR ) HFA 90 mcg/actuation inhaler   CLARITIN ORAL   estradioL  0.01 % (0.1 mg/gram) vaginal cream Commonly known as: ESTRACE  INSERT FINGERTIP SIZE AMOUNT VAGINALLY 2 TO 3 TIMES WEEKLY FOR MAINTENANCE   fluticasone  propionate 50 mcg/actuation nasal spray Commonly known as: FLONASE    levothyroxine  100 MCG tablet Commonly known as: SYNTHROID    magnesium oxide 400 mg (241.3  mg magnesium) tablet Commonly known as: MAG-OX   pantoprazole  40 MG DR tablet Commonly known as: PROTONIX  Take 1 tablet (40 mg total) by mouth once daily       Where to Get Your Medications    These medications were sent to [id number] [place] - [address] AT [facility] OF [address]  [address], [place] [address]   Phone: [phone]  predniSONE  5 MG tablet sulfaSALAzine 500 mg EC tablet    Orders Placed This Encounter  Procedures  . CBC w/auto Differential (5 Part)  . Hepatic Function Panel (HFP)  . Creatinine    All new prescription medications, changes in current prescription dosages, and sample medications were discussed with the patient, including patient education, medication name, use, dosage, potential side effects, drug interactions, consequences of not using/taking, and special instructions. Patient expressed understanding. No barriers to adherence. *Some images could not be shown.

## 2024-09-10 ENCOUNTER — Encounter: Payer: Self-pay | Admitting: Surgery

## 2024-09-11 ENCOUNTER — Encounter: Payer: Self-pay | Admitting: *Deleted

## 2024-09-11 NOTE — Progress Notes (Signed)
 Oncotype is still pending.   Dr. Melanee would like the appointment with her moved to next week. She will see her on 9/24.   Ms. Kraker would like to keep her appointments as scheduled with Dr. Lenn and [facility].

## 2024-09-11 NOTE — Progress Notes (Signed)
 Oncotype result came back at 21, Dr. Darold  appointment has been moved back to tomorrow.   Ms. Heinke notified of above.

## 2024-09-12 ENCOUNTER — Encounter: Payer: Self-pay | Admitting: Oncology

## 2024-09-12 ENCOUNTER — Ambulatory Visit: Admitting: Oncology

## 2024-09-12 ENCOUNTER — Encounter: Payer: Self-pay | Admitting: Radiation Oncology

## 2024-09-12 ENCOUNTER — Ambulatory Visit
Admission: RE | Admit: 2024-09-12 | Discharge: 2024-09-12 | Disposition: A | Discharge status: Home / Self Care | Source: Ambulatory Visit | Attending: Radiation Oncology | Admitting: Radiation Oncology

## 2024-09-12 ENCOUNTER — Encounter: Payer: Self-pay | Admitting: Occupational Therapy

## 2024-09-12 ENCOUNTER — Inpatient Hospital Stay: Admitting: Oncology

## 2024-09-12 ENCOUNTER — Inpatient Hospital Stay: Admitting: Occupational Therapy

## 2024-09-12 VITALS — BP 104/71 | HR 71 | Temp 97.4°F | Resp 16 | Ht 60.0 in | Wt 118.9 lb

## 2024-09-12 VITALS — BP 104/71 | HR 74 | Temp 96.8°F | Resp 16 | Ht 60.0 in | Wt 118.0 lb

## 2024-09-12 DIAGNOSIS — Z801 Family history of malignant neoplasm of trachea, bronchus and lung: Secondary | ICD-10-CM | POA: Diagnosis not present

## 2024-09-12 DIAGNOSIS — Z170 Estrogen receptor positive status [ER+]: Secondary | ICD-10-CM | POA: Insufficient documentation

## 2024-09-12 DIAGNOSIS — Z1722 Progesterone receptor negative status: Secondary | ICD-10-CM | POA: Insufficient documentation

## 2024-09-12 DIAGNOSIS — Z79811 Long term (current) use of aromatase inhibitors: Secondary | ICD-10-CM | POA: Diagnosis not present

## 2024-09-12 DIAGNOSIS — E039 Hypothyroidism, unspecified: Secondary | ICD-10-CM | POA: Diagnosis not present

## 2024-09-12 DIAGNOSIS — Z800 Family history of malignant neoplasm of digestive organs: Secondary | ICD-10-CM | POA: Diagnosis not present

## 2024-09-12 DIAGNOSIS — Z808 Family history of malignant neoplasm of other organs or systems: Secondary | ICD-10-CM | POA: Insufficient documentation

## 2024-09-12 DIAGNOSIS — Z79890 Hormone replacement therapy (postmenopausal): Secondary | ICD-10-CM | POA: Diagnosis not present

## 2024-09-12 DIAGNOSIS — C50412 Malignant neoplasm of upper-outer quadrant of left female breast: Secondary | ICD-10-CM | POA: Diagnosis not present

## 2024-09-12 DIAGNOSIS — Z7189 Other specified counseling: Secondary | ICD-10-CM

## 2024-09-12 DIAGNOSIS — E7800 Pure hypercholesterolemia, unspecified: Secondary | ICD-10-CM | POA: Insufficient documentation

## 2024-09-12 DIAGNOSIS — M129 Arthropathy, unspecified: Secondary | ICD-10-CM | POA: Insufficient documentation

## 2024-09-12 DIAGNOSIS — M25612 Stiffness of left shoulder, not elsewhere classified: Secondary | ICD-10-CM

## 2024-09-12 MED ORDER — ANASTROZOLE 1 MG PO TABS: 1.0000 mg | ORAL_TABLET | Freq: Every day | ORAL | 1 refills | Status: DC

## 2024-09-12 NOTE — Therapy (Signed)
 OUTPATIENT OCCUPATIONAL THERAPY BREAST CANCER POSTOP EVALUATION   Patient Name: Angela Pacheco MRN: [id number] DOB:1966-10-12, 58 y.o., female Today's Date: 09/12/2024  END OF SESSION:  OT End of Session - 09/12/24 1156     Visit Number 1    Number of Visits 4    Date for OT Re-Evaluation 11/28/24    OT Start Time 1015    OT Stop Time 1050    OT Time Calculation (min) 35 min    Activity Tolerance Patient tolerated treatment well    Behavior During Therapy [name] for tasks assessed/performed          Past Medical History:  Diagnosis Date   Allergy     cats trigger   Arthritis June 2022   Asthma Years ago   mild   B12 deficiency 05/06/2019   Complication of anesthesia    Dysplastic nevus 11/01/2017   Right middle shoulder, mod, excision by Dr Arlyss   Dysplastic nevus 07/03/2020   L medial scapular back, lat to T5, moderate, excision by Dr Arlyss   Dysplastic nevus 06/09/2020   L scapular back lateral, mild   Hypercholesteremia 01/31/2024   Hypothyroidism    Malignant neoplasm of upper-outer quadrant of left breast in female, estrogen receptor positive (HCC) 08/13/2024   Oxygen deficiency 2023   Use mouth sleep appliance. Respire   PONV (postoperative nausea and vomiting)    Sleep apnea 2023   Thyroid  disease 1998   Past Surgical History:  Procedure Laterality Date   ABDOMINAL HYSTERECTOMY     cervix left in place   ANTERIOR CRUCIATE LIGAMENT REPAIR Left 2017   APPENDECTOMY  1991   AXILLARY SENTINEL NODE BIOPSY Left 08/23/2024   Procedure: BIOPSY, LYMPH NODE, SENTINEL, AXILLARY;  Surgeon: Desiderio Schanz, MD;  Location: [facility];  Service: General;  Laterality: Left;   BREAST BIOPSY Left 08/03/2024   US  LT BREAST BX W LOC DEV 1ST LESION IMG BX SPEC US  GUIDE 08/03/2024 [facility]   BREAST BIOPSY Left 08/15/2024   US  LT BREAST SAVI/RF TAG 1ST LESION US  GUIDE 08/15/2024 [facility]   BREAST LUMPECTOMY WITH RADIO FREQUENCY LOCALIZER Left 08/23/2024    Procedure: BREAST LUMPECTOMY WITH RADIO FREQUENCY LOCALIZER;  Surgeon: Desiderio Schanz, MD;  Location: [facility];  Service: General;  Laterality: Left;   CHOLECYSTECTOMY     COLONOSCOPY WITH PROPOFOL  N/A 10/08/2019   Procedure: COLONOSCOPY WITH PROPOFOL ;  Surgeon: Unk Corinn Skiff, MD;  Location: [facility];  Service: Gastroenterology;  Laterality: N/A;   MASS EXCISION Right 08/23/2024   Procedure: EXCISION, MASS, CHEST WALL;  Surgeon: Desiderio Schanz, MD;  Location: [facility];  Service: General;  Laterality: Right;   Patient Active Problem List   Diagnosis Date Noted   Lipoma of lateral chest wall 08/23/2024   Genetic testing 08/22/2024   Malignant neoplasm of upper-outer quadrant of left breast in female, estrogen receptor positive (HCC) 08/13/2024   Hypercholesteremia 01/31/2024   Overactive bladder 10/31/2022   Abnormal liver function test 06/22/2022   OSA (obstructive sleep apnea) 05/06/2022   Dizziness 04/18/2022   Daytime sleepiness 03/15/2022   Epigastric pain 02/26/2022   Decreased hearing 01/24/2022   Fatigue 01/24/2022   GERD (gastroesophageal reflux disease) 01/24/2022   Status post shoulder surgery 11/24/2021   Stress 10/11/2021   Right foot pain 04/03/2021   Impingement syndrome of left shoulder region 09/22/2020   Tingling in extremities 09/14/2020   Axillary fullness 06/18/2020   Right shoulder pain 01/01/2020   B12 deficiency 05/06/2019   Neck fullness 01/20/2019  Family history of lupus erythematosus 10/04/2018   Positive ANA (antinuclear antibody) 10/04/2018   Joint pain 09/15/2018   Immunity to measles determined by serologic test 06/30/2018   Abnormal mammogram 07/07/2016   Health care maintenance 04/05/2015   Environmental allergies 01/17/2015   Asthma 01/17/2015   Menopausal symptoms 01/17/2015   Endometriosis 01/17/2015   Hypothyroidism 01/14/2015    PCP: Dr Glendia  REFERRING PROVIDER: Dr Melanee  REFERRING DIAG: L lumpectomy   THERAPY DIAG:   Stiffness of left shoulder, not elsewhere classified  Rationale for Evaluation and Treatment: Rehabilitation  ONSET DATE: 08/23/24  SUBJECTIVE:                                                                                                                                                                                           SUBJECTIVE STATEMENT: Patient reports she is here today after being refer by one of her medical team for postop L lumpectomy   PERTINENT HISTORY:  Assessment/Plan: This is a 58 y.o. female s/p left breast lumpectomy and SLNBx and right chest wall lipoma excision on 08/23/24 by Dr Desiderio  Note from surgeon on 09/05/24   --Patient's right chest wall incision was debrided and cleaned.  No necrosis or purulence.  The wound was packed and dressing instructions given to her and her husband.  Continue this until wound is healed. --From breast standpoint, she's doing well.  Discussed again pathology results.  Oncotype is pending and she has follow up with Dr. Lenn and Dr. Melanee next week. --Follow up in 2 weeks to check on her chest wound  PATIENT GOALS:   reduce lymphedema risk and learn post op HEP.   PAIN:  Are you having pain? Slight pull overhead  PRECAUTIONS: Active CA , lifiting precautions for 6 wks - R lipoma site open and do some wound packing     HAND DOMINANCE: right  WEIGHT BEARING RESTRICTIONS: No  FALLS:  Has patient fallen in last 6 months? 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  Patient very active likes the outdoors activities as well as granddaughter and working out with a [employer] for cardio and strengthening.  Done weights prior to surgery 10 to 20 pounds.   OBJECTIVE:  COGNITION: Overall cognitive status: Within functional limits for tasks assessed    POSTURE:  Forward head and rounded shoulders posture  UPPER EXTREMITY  AROM/PROM: Patient had lipoma removed under right breast.  Is doing some wound care.  Recommend for patient to stay below 90 degrees for shoulder flexion abduction and external rotation  to behind head.  To decrease pulling on incision.  Until healed then can go overhead.  Patient able to get into radiation position with bilateral arms. Left shoulder flexion and abduction 160 degrees.  And external rotation 85 degrees.  With a slight pull over the anterior chest and pec tendon.    CERVICAL AROM: All within normal limits:    UPPER EXTREMITY STRENGTH: Not tested patient 3 to 4 weeks postop  LYMPHEDEMA ASSESSMENTS:   LYMPHEDEMA/ONCOLOGY QUESTIONNAIRE - 09/12/24 0001       Right Upper Extremity Lymphedema   15 cm Proximal to Olecranon Process 28 cm    10 cm Proximal to Olecranon Process 25.4 cm    Olecranon Process 22.5 cm      Left Upper Extremity Lymphedema   15 cm Proximal to Olecranon Process 27.3 cm    10 cm Proximal to Olecranon Process 24.5 cm    Olecranon Process 22 cm           L-DEX LYMPHEDEMA SCREENING:  The patient was assessed using the L-Dex machine today to produce a lymphedema index baseline score. The patient will be reassessed on a regular basis (typically every 3 months) to obtain new L-Dex scores. If the score is > 6.5 points away from his/her baseline score indicating onset of subclinical lymphedema, it will be recommended to wear a compression garment for 4 weeks, 12 hours per day and then be reassessed. If the score continues to be > 6.5 points from baseline at reassessment, we will initiate lymphedema treatment. Assessing in this manner has a 95% rate of preventing clinically significant lymphedema.   L-DEX FLOWSHEETS - 09/12/24 1600       L-DEX LYMPHEDEMA SCREENING   Measurement Type Unilateral    L-DEX MEASUREMENT EXTREMITY Upper Extremity    POSITION  Standing    DOMINANT SIDE Right    At Risk Side Left    BASELINE SCORE (UNILATERAL) 1.2    L-DEX SCORE  (UNILATERAL) 1.4    VALUE CHANGE (UNILAT) 0.2         Within normal range    SESSION HEP :  Patient was educated in signs and symptoms as well as precautions and prevention for lymphedema.  Handout provided.  That was in the cancer journal.  As well as article on air travel that was published by the National lymphedema network.  Circumference of bilateral upper extremity was taken as well as L-Dex score.  Those were within normal range.  Patient had lipoma removed on the right side under the breast.  That she is doing some wound care to them still healing -recommend for patient to stay about 90 degrees for shoulder flexion and abduction on the right side and external rotation behind head. Reviewed with patient home program for active assisted range of motion for left shoulder flexion in supine as well as active assisted range of motion using golf club for left shoulder abduction.  12-15 reps  2 times a day to 3 times a day.  Tolerated well except endrange slight pull.  Recommend for patient to keep pull under 2/10. External rotation behind the head with gravity. Was able to get into radiation position with bilateral arms in supine using gravity. Recommend for patient when lipoma incision is healed and closed patient can transition from home program to active assisted range of motion on the wall for shoulder flexion and abduction.  10 reps to 3 times a day Recommend for patient to follow-up with me 1 more time  around first week of radiation.  PATIENT EDUCATION:  Education details: Lymphedema risk reduction and post op shoulder/posture HEP Person educated: Patient Education method: Explanation, Demonstration, Handout Education comprehension: Patient verbalized understanding and returned demonstration     ASSESSMENT:  CLINICAL IMPRESSION: Patient presents today.  Left lumpectomy on 08/23/2024 by Dr. Desiderio patient also had a lipoma removed on the right side under the chest.  Patient  doing really well with lumpectomy healing.  But appear right lipoma incision still not close is doing some wound care.  Recommend for patient to maintain range of motion on the right to about 90 degrees for shoulder flexion and abduction.  For the left reviewed with patient and set up her with a home program in supine for active range of motion for shoulder flexion and active assisted for abduction.  Patient was able to perform external rotation with gravity in supine within normal limits as well as was able to get into radiation position without pull on incisions.  Reviewed with patient also signs and symptoms as well as prevention and precautions for lymphedema.  On paper patient's risk is low but provided her with a article that was in the cancer journal as well as a handout on air travel that was published by the National lymphedema network.  Patient to continue with home program can transition to active assisted range of motion on the wall for shoulder flexion and abduction when incisions is closed and healed.  Follow-up with me first week of radiation.  She will benefit from a post op OT reassessment to determine needs and from L-Dex screens every 3 months for 2 years to detect subclinical lymphedema.  Pt will benefit from skilled therapeutic intervention to improve on the following deficits: Decreased knowledge of precautions and lymphedema education, impaired UE functional use, pain, decreased ROM, postural dysfunction.   OT treatment/interventions: ADL/self-care home management, pt/family education, therapeutic exercise,manual therapy  REHAB POTENTIAL: Good  CLINICAL DECISION MAKING: Stable/uncomplicated  EVALUATION COMPLEXITY: Low   GOALS: Goals reviewed with patient? YES  LONG TERM GOALS: (STG=LTG)    Name Target Date Goal status  1 Pt will be able to verbalize understanding of pertinent lymphedema risk reduction practices relevant to her dx specifically related to skin care.   Baseline:  No knowledge 6 weeks Initial  2 Pt will be able to return demo and/or verbalize understanding of the post op HEP related to regaining shoulder ROM. Baseline:  No knowledge Today Achieved at eval       3 Pt will demo she has regained full shoulder ROM and function post operatively compared to baselines.  Baseline: See objective measurements taken today. 6 weeks Initial    PLAN:  OT FREQUENCY/DURATION: EVAL and 2-3 visits for 12 wks  PLAN FOR NEXT SESSION:  Occupational Therapy Information for After Breast Cancer Surgery/Treatment:  Lymphedema is a swelling condition that you may be at risk for in your arm if you have lymph nodes removed from the armpit area.  After a sentinel node biopsy, the risk is approximately 5-9% and is higher after an axillary node dissection.  There is treatment available for this condition and it is not life-threatening.  Contact your physician or occupational therapist with concerns. You may begin the 4 shoulder/posture exercises (see additional sheet) when permitted by your physician (typically a week after surgery).  If you have drains, you may need to wait until those are removed before beginning range of motion exercises.  A general recommendation is to not lift  your arms above shoulder height until drains are removed.  These exercises should be done to your tolerance and gently.  This is not a no pain/no gain type of recovery so listen to your body and stretch into the range of motion that you can tolerate, stopping if you have pain.  If you are having immediate reconstruction, ask your plastic surgeon about doing exercises as he or she may want you to wait. .  While undergoing any medical procedure or treatment, try to avoid blood pressure being taken or needle sticks from occurring on the arm on the side of cancer.   This recommendation begins after surgery and continues for the rest of your life.  This may help reduce your risk of getting lymphedema  (swelling in your arm). An excellent resource for those seeking information on lymphedema is the National Lymphedema Network's web site. It can be accessed at www.lymphnet.org If you notice swelling in your hand, arm or breast at any time following surgery (even if it is many years from now), please contact your doctor or occupational therapist to discuss this.  Lymphedema can be treated at any time but it is easier for you if it is treated early on.  If you feel like your shoulder motion is not returning to normal in a reasonable amount of time, please contact your surgeon or occupational therapist.  [facility] [phone]. [address] [place], [place] [address]       Ancel Peters, OTR/L,CLT 09/12/2024, 4:53 PM

## 2024-09-12 NOTE — Progress Notes (Signed)
 Pt seen in rad onc this morning.

## 2024-09-12 NOTE — Progress Notes (Signed)
 Hematology/Oncology Consult note [facility]  [phone] [phone]  Patient Care Team: Glendia Shad, MD as PCP - General (Internal Medicine) Georgina Shasta POUR, RN as Oncology Nurse Navigator   Name of the patient: Angela Pacheco  [id number]  November 19, 1966   Date of visit: 09/12/24  Diagnosis-  Cancer Staging  Malignant neoplasm of upper-outer quadrant of left breast in female, estrogen receptor positive (HCC) Staging form: Breast, AJCC 8th Edition - Clinical stage from 08/13/2024: Stage IA (cT1a, cN0, cM0, G1, ER+, PR-, HER2-) - Signed by Melanee Annah BROCKS, MD on 08/13/2024 Stage prefix: Initial diagnosis Histologic grading system: 3 grade system - Pathologic stage from 09/12/2024: Stage IA (pT1b, pN0(sn), cM0, G1, ER+, PR-, HER2-, Oncotype DX score: 21) - Signed by Melanee Annah BROCKS, MD on 09/12/2024 Stage prefix: Initial diagnosis Method of lymph node assessment: Sentinel lymph node biopsy Multigene prognostic tests performed: Oncotype DX Recurrence score range: Greater than or equal to 11 Histologic grading system: 3 grade system    Chief complaint/ Reason for visit-discuss final pathology results and further management  Heme/Onc history: patient is a 58 yr old Female with a past medical history significant for hypothyroidism who underwent a routine screening mammogram in July 2025 which showed possible abnormality in her left breast.  This was followed by diagnostic mammogram and an ultrasound which showed 5 x 5 x 4 mm irregular hypoechoic mass in the left upper outer breast at the 1:30 position 6 cm from the nipple.  No suspicious left axillary lymph nodes.  This was biopsied and was consistent with invasive mammary carcinoma grade 1 ER 95% positive, PR negative and HER2 negative +1.  Patient will be meeting with Dr. Desiderio today to discuss lumpectomy and possible sentinel lymph node biopsy.   Family history significant for breast cancer in  her paternal cousin.  Dad had melanoma and maternal grandparents had colon cancer.  Menarche at the age of 34.  G2, P2.  Age at first birth 85.  She has taken birth control in the past.  She underwent hysterectomy at the age of 70 for endometriosis but still has ovaries in situ.  Hormone levels were postmenopausal  Final lumpectomy pathology from 08/23/2024 showed 1 cm grade 1 invasive mammary Carcinoma with negative margins.  2 sentinel lymph nodes negative for malignancy.  Tumor was ER positive PR negative and HER2 negative.  Oncotype score was 21 and therefore patient does not require any adjuvant chemotherapy.  Genetic testing negative    Interval history-patient is recovering well from her lumpectomy.  Denies any specific complaints at this time  ECOG PS- 0 Pain scale- 0   Review of systems- Review of Systems  Constitutional:  Negative for chills, fever, malaise/fatigue and weight loss.  HENT:  Negative for congestion, ear discharge and nosebleeds.   Eyes:  Negative for blurred vision.  Respiratory:  Negative for cough, hemoptysis, sputum production, shortness of breath and wheezing.   Cardiovascular:  Negative for chest pain, palpitations, orthopnea and claudication.  Gastrointestinal:  Negative for abdominal pain, blood in stool, constipation, diarrhea, heartburn, melena, nausea and vomiting.  Genitourinary:  Negative for dysuria, flank pain, frequency, hematuria and urgency.  Musculoskeletal:  Negative for back pain, joint pain and myalgias.  Skin:  Negative for rash.  Neurological:  Negative for dizziness, tingling, focal weakness, seizures, weakness and headaches.  Endo/Heme/Allergies:  Does not bruise/bleed easily.  Psychiatric/Behavioral:  Negative for depression and suicidal ideas. The patient does not have insomnia.  Allergies  Allergen Reactions   Gluten Meal Other (See Comments)    GI upset, joint pain, headache   Wound Dressing Adhesive Rash    BAND-AIDS      Past Medical History:  Diagnosis Date   Allergy     cats trigger   Arthritis June 2022   Asthma Years ago   mild   B12 deficiency 05/06/2019   Complication of anesthesia    Dysplastic nevus 11/01/2017   Right middle shoulder, mod, excision by Dr Arlyss   Dysplastic nevus 07/03/2020   L medial scapular back, lat to T5, moderate, excision by Dr Arlyss   Dysplastic nevus 06/09/2020   L scapular back lateral, mild   Hypercholesteremia 01/31/2024   Hypothyroidism    Malignant neoplasm of upper-outer quadrant of left breast in female, estrogen receptor positive (HCC) 08/13/2024   Oxygen deficiency 2023   Use mouth sleep appliance. Respire   PONV (postoperative nausea and vomiting)    Sleep apnea 2023   Thyroid  disease 1998     Past Surgical History:  Procedure Laterality Date   ABDOMINAL HYSTERECTOMY     cervix left in place   ANTERIOR CRUCIATE LIGAMENT REPAIR Left 2017   APPENDECTOMY  1991   AXILLARY SENTINEL NODE BIOPSY Left 08/23/2024   Procedure: BIOPSY, LYMPH NODE, SENTINEL, AXILLARY;  Surgeon: Desiderio Schanz, MD;  Location: [facility];  Service: General;  Laterality: Left;   BREAST BIOPSY Left 08/03/2024   US  LT BREAST BX W LOC DEV 1ST LESION IMG BX SPEC US  GUIDE 08/03/2024 [facility]   BREAST BIOPSY Left 08/15/2024   US  LT BREAST SAVI/RF TAG 1ST LESION US  GUIDE 08/15/2024 [facility]   BREAST LUMPECTOMY WITH RADIO FREQUENCY LOCALIZER Left 08/23/2024   Procedure: BREAST LUMPECTOMY WITH RADIO FREQUENCY LOCALIZER;  Surgeon: Desiderio Schanz, MD;  Location: [facility];  Service: General;  Laterality: Left;   CHOLECYSTECTOMY     COLONOSCOPY WITH PROPOFOL  N/A 10/08/2019   Procedure: COLONOSCOPY WITH PROPOFOL ;  Surgeon: Unk Corinn Skiff, MD;  Location: [facility];  Service: Gastroenterology;  Laterality: N/A;   MASS EXCISION Right 08/23/2024   Procedure: EXCISION, MASS, CHEST WALL;  Surgeon: Desiderio Schanz, MD;  Location: [facility];  Service: General;  Laterality: Right;     Social History   Socioeconomic History   Marital status: Married    Spouse name: Franky   Number of children: 1   Years of education: Not on file   Highest education level: Bachelor's degree (e.g., BA, AB, BS)  Occupational History   Not on file  Tobacco Use   Smoking status: Never   Smokeless tobacco: Never  Vaping Use   Vaping status: Never Used  Substance and Sexual Activity   Alcohol use: Yes    Comment: Seltzer maybe 1-2 a week   Drug use: No   Sexual activity: Yes    Birth control/protection: Post-menopausal    Comment: N/A  Other Topics Concern   Not on file  Social History Narrative   Not on file   Social Drivers of Health   Financial Resource Strain: Low Risk  (06/08/2024)   Received from [facility]   Overall Financial Resource Strain (CARDIA)    Difficulty of Paying Living Expenses: Not hard at all  Food Insecurity: No Food Insecurity (08/13/2024)   Hunger Vital Sign    Worried About Running Out of Food in the Last Year: Never true    Ran Out of Food in the Last Year: Never true  Transportation Needs: No Transportation  Needs (08/13/2024)   PRAPARE - Administrator, Civil Service (Medical): No    Lack of Transportation (Non-Medical): No  Physical Activity: Insufficiently Active (01/31/2024)   Exercise Vital Sign    Days of Exercise per Week: 3 days    Minutes of Exercise per Session: 30 min  Stress: No Stress Concern Present (01/31/2024)   [employer] of Occupational Health - Occupational Stress Questionnaire    Feeling of Stress : Only a little  Social Connections: Socially Integrated (01/31/2024)   Social Connection and Isolation Panel    Frequency of Communication with Friends and Family: More than three times a week    Frequency of Social Gatherings with Friends and Family: Once a week    Attends Religious Services: More than 4 times per year    Active Member of [employer] or Organizations: Yes    Attends Museum/gallery exhibitions officer: More than 4 times per year    Marital Status: Married  Catering manager Violence: Not At Risk (08/13/2024)   Humiliation, Afraid, Rape, and Kick questionnaire    Fear of Current or Ex-Partner: No    Emotionally Abused: No    Physically Abused: No    Sexually Abused: No    Family History  Problem Relation Age of Onset   Arthritis Mother    Hearing loss Mother    Melanoma Father        stage IV   Heart disease Father    Arthritis Sister    Hearing loss Sister    Heart disease Brother    Clotting disorder Brother    Lung cancer Maternal Aunt    Colon cancer Maternal Uncle        in polyp   Colon cancer Maternal Grandmother 94   Colon cancer Maternal Grandfather 43   Asthma Daughter    Autoimmune disease Daughter    Breast cancer Paternal Cousin      Current Outpatient Medications:    anastrozole  (ARIMIDEX ) 1 MG tablet, Take 1 tablet (1 mg total) by mouth daily., Disp: 90 tablet, Rfl: 1   acetaminophen  (TYLENOL ) 500 MG tablet, Take 2 tablets (1,000 mg total) by mouth every 6 (six) hours as needed for mild pain (pain score 1-3)., Disp: , Rfl:    Calcium Carbonate-Vit D-Min (CALTRATE PLUS PO), Take 1 tablet by mouth in the morning and at bedtime., Disp: , Rfl:    cetirizine (ZYRTEC) 10 MG tablet, Take 10 mg by mouth at bedtime., Disp: , Rfl:    cyanocobalamin  (VITAMIN B12) 1000 MCG/ML injection, INJECT 1 ML IN THE MUSCLE EVERY 30 DAYS, Disp: 10 mL, Rfl: 0   ibuprofen  (ADVIL ) 600 MG tablet, Take 1 tablet (600 mg total) by mouth every 8 (eight) hours as needed for moderate pain (pain score 4-6)., Disp: 60 tablet, Rfl: 1   levothyroxine  (SYNTHROID ) 100 MCG tablet, TAKE 1 TABLET(100 MCG) BY MOUTH DAILY, Disp: 30 tablet, Rfl: 5   Magnesium 400 MG CAPS, Take 400 mg by mouth every evening., Disp: , Rfl:    Omega-3 Fatty Acids (OMEGA 3 FISH OIL PO), Take 2 capsules by mouth at bedtime., Disp: , Rfl:    omeprazole (PRILOSEC OTC) 20 MG tablet, Take 20 mg by mouth in  the morning., Disp: , Rfl:    SYRINGE-NEEDLE, DISP, 3 ML (BD INTEGRA SYRINGE) 25G X 1 3 ML MISC, Use as directed with B12 injections, Disp: 100 each, Rfl: 0   Triamcinolone Acetonide (NASACORT AQ NA), Place 1 spray into the  nose in the morning and at bedtime., Disp: , Rfl:    vitamin C (ASCORBIC ACID) 500 MG tablet, Take 500 mg by mouth 2 (two) times a week., Disp: , Rfl:   Physical exam:  Vitals:   09/12/24 0952  BP: 104/71  Pulse: 74  Resp: 16  Temp: (!) 96.8 F (36 C)  TempSrc: Tympanic  SpO2: 98%  Weight: 118 lb (53.5 kg)  Height: 5' (1.524 m)   Physical Exam Cardiovascular:     Rate and Rhythm: Normal rate and regular rhythm.     Heart sounds: Normal heart sounds.  Pulmonary:     Effort: Pulmonary effort is normal.     Breath sounds: Normal breath sounds.  Skin:    General: Skin is warm and dry.  Neurological:     Mental Status: She is alert and oriented to person, place, and time.      I have personally reviewed labs listed below:    Latest Ref Rng & Units 08/01/2024   10:50 AM  CMP  Glucose 70 - 99 mg/dL 98   BUN 6 - 23 mg/dL 20   Creatinine 9.59 - 1.20 mg/dL 9.09   Sodium 864 - 854 mEq/L 139   Potassium 3.5 - 5.1 mEq/L 4.0   Chloride 96 - 112 mEq/L 102   CO2 19 - 32 mEq/L 29   Calcium 8.4 - 10.5 mg/dL 9.7   Total Protein 6.0 - 8.3 g/dL 7.1   Total Bilirubin 0.2 - 1.2 mg/dL 0.6   Alkaline Phos 39 - 117 U/L 50   AST 0 - 37 U/L 15   ALT 0 - 35 U/L 21       Latest Ref Rng & Units 08/01/2024   10:50 AM  CBC  WBC 4.0 - 10.5 K/uL 9.4   Hemoglobin 12.0 - 15.0 g/dL 85.1   Hematocrit 63.9 - 46.0 % 43.8   Platelets 150.0 - 400.0 K/uL 338.0    I have personally reviewed Radiology images listed below: No images are attached to the encounter.  MM Breast Surgical Specimen Result Date: 08/23/2024 CLINICAL DATA:  Status post [facility] localized LEFT breast lumpectomy. Patient is status post ultrasound-guided biopsy of a LEFT breast mass demonstrating invasive  ductal carcinoma (COIL clip). EXAM: SPECIMEN RADIOGRAPH OF THE LEFT BREAST COMPARISON:  Previous exam(s). FINDINGS: Status post excision of the LEFT breast. The [facility] reflector and the COIL shaped clip are present within the specimen. IMPRESSION: Specimen radiograph of the LEFT breast. Electronically Signed   By: Corean Salter M.D.   On: 08/23/2024 14:41   NM Sentinel Node Inj-No Rpt (Breast) Result Date: 08/23/2024 Lymphoseek was injected by the Nuclear Medicine Technologist for sentinel lymph node localization.   MM 3D DIAGNOSTIC MAMMOGRAM UNILATERAL LEFT BREAST Result Date: 08/15/2024 CLINICAL DATA:  LEFT breast IDC for preoperative localization EXAM: DIGITAL DIAGNOSTIC UNILATERAL LEFT MAMMOGRAM WITH TOMOSYNTHESIS AND CAD TECHNIQUE: Left digital diagnostic mammography and breast tomosynthesis was performed. The images were evaluated with computer-aided detection. COMPARISON:  Previous exam(s). ACR Breast Density Category b: There are scattered areas of fibroglandular density. FINDINGS: SAVI SCOUT marker is appropriately positioned in the LEFT breast adjacent to coil clip, corresponding to recently biopsy-proven IDC in the upper-outer quadrant of the LEFT breast. There is residual distortion at the site of biopsy. No new suspicious findings are seen in the LEFT breast. IMPRESSION: Successful ultrasound-guided needle localization of biopsy-proven malignancy in the upper-outer quadrant of the LEFT breast. Images were marked for Dr. Desiderio. RECOMMENDATION:  Treatment plan per surgical/oncology team. I have discussed the findings and recommendations with the patient. If applicable, a reminder letter will be sent to the patient regarding the next appointment. BI-RADS CATEGORY  6: Known biopsy-proven malignancy. Electronically Signed   By: Norleen Croak M.D.   On: 08/15/2024 16:05   US  LT BREAST SAVI/RF TAG 1ST LESION US  GUIDE Result Date: 08/15/2024 CLINICAL DATA:  LEFT breast localization for  biopsy-proven IDC EXAM: NEEDLE LOCALIZATION OF THE LEFT BREAST WITH ULTRASOUND GUIDANCE COMPARISON:  Previous exam(s). FINDINGS: Patient presents for needle localization prior to LEFT breast lumpectomy for biopsy-proven IDC. I met with the patient and we discussed the procedure of needle localization including benefits and alternatives. We discussed the high likelihood of a successful procedure. We discussed the risks of the procedure, including infection, bleeding, tissue injury, and further surgery. Informed, written consent was given. The usual time-out protocol was performed immediately prior to the procedure. Using ultrasound guidance, sterile technique, 1% lidocaine  and a 10 cm SAVI SCOUT needle, the LEFT breast mass at 1:30, 6 cm from the nipple was localized using a from below approach. The images were marked for Dr. Desiderio. IMPRESSION: Radar reflector localization of the LEFT breast. No apparent complications. Electronically Signed   By: Norleen Croak M.D.   On: 08/15/2024 15:56     Assessment and plan- Patient is a 58 y.o. female with history of pathological prognostic stage Ia invasive mammary carcinoma of the left breast pT1b N0 M0 ER positive PR negative HER2 negative.  Discussed further management  Discussed the results of Oncotype score which was 21 and given that patient is more than 58 years of age and postmenopausal she would not benefit from adjuvant chemotherapy.  She has already met with radiation oncology today to discuss adjuvant radiation.  Given that her tumor was ER PR positive hormone therapy is indicated at this time.  Idiscussed the role for hormone therapy. Given that she is postmenopausal I would favor 5 years of adjuvant hormone therapy with aromatase inhibitor. I discussed the risks and benefits of Arimidex  including all but not limited to fatigue, hypercholesterolemia, hot flashes, arthralgias and worsening bone health.  Patient will also need to be on calcium 1200 mg along  with vitamin D 800 international units.  We will obtain a baseline bone density scan written information about Arimidex  given to the patient. I would like her to finish radiation therapy and start hormone therapy thereafter. Patient verbalized understanding and agrees to proceed  I will tentatively see her back in about 3-1/2 months to see how she is tolerating Arimidex  with CMP   Visit Diagnosis 1. Malignant neoplasm of upper-outer quadrant of left breast in female, estrogen receptor positive (HCC)   2. Goals of care, counseling/discussion      Dr. Annah Skene, MD, MPH [facility] at [facility] [phone] 09/12/2024 12:43 PM

## 2024-09-12 NOTE — Consult Note (Signed)
 NEW PATIENT EVALUATION  Name: Angela Pacheco  MRN: [id number]  Date:   09/12/2024     DOB: 09/10/1966   This 58 y.o. 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  REFERRING PHYSICIAN: Glendia Shad, MD  CHIEF COMPLAINT:  Chief Complaint  Patient presents with   Breast Cancer    DIAGNOSIS: The encounter diagnosis was Malignant neoplasm of upper-outer quadrant of left female breast, unspecified estrogen receptor status (HCC).   PREVIOUS INVESTIGATIONS:  Mammogram and ultrasound reviewed Clinical notes reviewed Pathology reports reviewed  HPI: Patient is a 58 year old femaleWho presented with an abnormal mammogram of her left breast showing suspicious 5 mm mass with associated architectural distortion.  There is no suspicious left axillary adenopathy.  Patient went ultrasound-guided biopsy which was positive for invasive ductal carcinoma overall grade 1.  She went on to have a wide local excision showing overall grade one 1 cm invasive mammary carcinoma of no special type.  All margins were negative distance to closest margin was 7 mm.  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  DCIS were also negative closest margin 8 mm.  2 sentinel lymph nodes were examined both negative for malignancy.  Tumor was ER positive PR negative HER2/neu not overexpressed.  She has done well postoperatively.  She specifically denies breast tenderness cough or bone pain.  Her Oncotype DX came back at 21 she is seeing Dr. Melanee although I believe she will be deferred for systemic treatment.  PLANNED TREATMENT REGIMEN: Hypofractionated left whole breast radiation DIBH  PAST MEDICAL HISTORY:  has a past medical history of Allergy , Arthritis (June 2022), Asthma (Years ago), B12 deficiency (05/06/2019), Complication of anesthesia, Dysplastic nevus (11/01/2017),  Dysplastic nevus (07/03/2020), Dysplastic nevus (06/09/2020), Hypercholesteremia (01/31/2024), Hypothyroidism, Malignant neoplasm of upper-outer quadrant of left breast in female, estrogen receptor positive (HCC) (08/13/2024), Oxygen deficiency (2023), PONV (postoperative nausea and vomiting), Sleep apnea (2023), and Thyroid  disease (1998).    PAST SURGICAL HISTORY:  Past Surgical History:  Procedure Laterality Date   ABDOMINAL HYSTERECTOMY     cervix left in place   ANTERIOR CRUCIATE LIGAMENT REPAIR Left 2017   APPENDECTOMY  1991   AXILLARY SENTINEL NODE BIOPSY Left 08/23/2024   Procedure: BIOPSY, LYMPH NODE, SENTINEL, AXILLARY;  Surgeon: Desiderio Schanz, MD;  Location: [facility];  Service: General;  Laterality: Left;   BREAST BIOPSY Left 08/03/2024   US  LT BREAST BX W LOC DEV 1ST LESION IMG BX SPEC US  GUIDE 08/03/2024 [facility]   BREAST BIOPSY Left 08/15/2024   US  LT BREAST SAVI/RF TAG 1ST LESION US  GUIDE 08/15/2024 [facility]   BREAST LUMPECTOMY WITH RADIO FREQUENCY LOCALIZER Left 08/23/2024   Procedure: BREAST LUMPECTOMY WITH RADIO FREQUENCY LOCALIZER;  Surgeon: Desiderio Schanz, MD;  Location: [facility];  Service: General;  Laterality: Left;   CHOLECYSTECTOMY     COLONOSCOPY WITH PROPOFOL  N/A 10/08/2019   Procedure: COLONOSCOPY WITH PROPOFOL ;  Surgeon: Unk Corinn Skiff, MD;  Location: [facility];  Service: Gastroenterology;  Laterality: N/A;   MASS EXCISION Right 08/23/2024   Procedure: EXCISION, MASS, CHEST WALL;  Surgeon: Desiderio Schanz, MD;  Location: [facility];  Service: General;  Laterality: Right;    FAMILY HISTORY: family history includes Arthritis in her mother and sister; Asthma in her daughter; Autoimmune disease in her daughter; Breast cancer in her paternal cousin; Clotting disorder in her brother; Colon cancer in her  maternal uncle; Colon cancer (age of onset: 6) in her maternal grandmother; Colon cancer (age of onset: 31) in her maternal grandfather; Hearing loss in  her mother and sister; Heart disease in her brother and father; Lung cancer in her maternal aunt; Melanoma in her father.  SOCIAL HISTORY:  reports that she has never smoked. She has never used smokeless tobacco. She reports current alcohol use. She reports that she does not use drugs.  ALLERGIES: Gluten meal and Wound dressing adhesive  MEDICATIONS:  Current Outpatient Medications  Medication Sig Dispense Refill   acetaminophen  (TYLENOL ) 500 MG tablet Take 2 tablets (1,000 mg total) by mouth every 6 (six) hours as needed for mild pain (pain score 1-3).     Calcium Carbonate-Vit D-Min (CALTRATE PLUS PO) Take 1 tablet by mouth in the morning and at bedtime.     cetirizine (ZYRTEC) 10 MG tablet Take 10 mg by mouth at bedtime.     cyanocobalamin  (VITAMIN B12) 1000 MCG/ML injection INJECT 1 ML IN THE MUSCLE EVERY 30 DAYS 10 mL 0   ibuprofen  (ADVIL ) 600 MG tablet Take 1 tablet (600 mg total) by mouth every 8 (eight) hours as needed for moderate pain (pain score 4-6). 60 tablet 1   levothyroxine  (SYNTHROID ) 100 MCG tablet TAKE 1 TABLET(100 MCG) BY MOUTH DAILY 30 tablet 5   Magnesium 400 MG CAPS Take 400 mg by mouth every evening.     Omega-3 Fatty Acids (OMEGA 3 FISH OIL PO) Take 2 capsules by mouth at bedtime.     omeprazole (PRILOSEC OTC) 20 MG tablet Take 20 mg by mouth in the morning.     SYRINGE-NEEDLE, DISP, 3 ML (BD INTEGRA SYRINGE) 25G X 1 3 ML MISC Use as directed with B12 injections 100 each 0   Triamcinolone Acetonide (NASACORT AQ NA) Place 1 spray into the nose in the morning and at bedtime.     vitamin C (ASCORBIC ACID) 500 MG tablet Take 500 mg by mouth 2 (two) times a week.     anastrozole  (ARIMIDEX ) 1 MG tablet Take 1 tablet (1 mg total) by mouth daily. 90 tablet 1   No current facility-administered medications for this encounter.    ECOG PERFORMANCE STATUS:  0 - Asymptomatic  REVIEW OF SYSTEMS: Patient denies any weight loss, fatigue, weakness, fever, chills or night sweats.  Patient denies any loss of vision, blurred vision. Patient denies any ringing  of the ears or hearing loss. No irregular heartbeat. Patient denies heart murmur or history of fainting. Patient denies any chest pain or pain radiating to her upper extremities. Patient denies any shortness of breath, difficulty breathing at night, cough or hemoptysis. Patient denies any swelling in the lower legs. Patient denies any nausea vomiting, vomiting of blood, or coffee ground material in the vomitus. Patient denies any stomach pain. Patient states has had normal bowel movements no significant constipation or diarrhea. Patient denies any dysuria, hematuria or significant nocturia. Patient denies any problems walking, swelling in the joints or loss of balance. Patient denies any skin changes, loss of hair or loss of weight. Patient denies any excessive worrying or anxiety or significant depression. Patient denies any problems with insomnia. Patient denies excessive thirst, polyuria, polydipsia. Patient denies any swollen glands, patient denies easy bruising or easy bleeding. Patient denies any recent infections, allergies or URI. Patient s visual fields have not changed significantly in recent time.   PHYSICAL EXAM: BP 104/71   Pulse 71   Temp (!) 97.4 F (36.3 C)  Resp 16   Ht (S) 5' (1.524 m) Comment: stated ht  Wt 118 lb 14.4 oz (53.9 kg)   BMI 23.22 kg/m  Patient status post wide local excision the left breast incisions are well-healed.  No dominant masses noted in either breast.  No axillary or supraclavicular adenopathy is identified.  Well-developed well-nourished patient in NAD. HEENT reveals PERLA, EOMI, discs not visualized.  Oral cavity is clear. No oral mucosal lesions are identified. Neck is clear without evidence of cervical or supraclavicular adenopathy. Lungs are clear to A&P. Cardiac examination is essentially unremarkable with regular rate and rhythm without murmur rub or thrill. Abdomen is benign  with no organomegaly or masses noted. Motor sensory and DTR levels are equal and symmetric in the upper and lower extremities. Cranial nerves II through XII are grossly intact. Proprioception is intact. No peripheral adenopathy or edema is identified. No motor or sensory levels are noted. Crude visual fields are within normal range.  LABORATORY DATA: Pathology reports reviewed    RADIOLOGY RESULTS: Mammogram and ultrasound reviewed compatible with above-stated findings   IMPRESSION: Stage Ia ER positive invasive mammary carcinoma of the left breast status post wide local excision and sentinel node biopsy in 58 year old female  PLAN: At this time I have recommended hypofractionated course of radiation therapy to her whole breast.  Would also boost her scar another 1000 cGy using photon beam therapy.  We will attempt deep inspiration breath-hold technique to spare more cardiac exposure.  Risks and benefits of treatment including skin reaction fatigue alteration of blood counts possible inclusion of superficial lung all were reviewed in detail with the patient.  I have personally set up and ordered CT simulation for early next week.  Patient will be a candidate for endocrine therapy after completion of radiation.  Patient and husband both comprehend my treatment plan well.  I would like to take this opportunity to thank you for allowing me to participate in the care of your patient.Angela Marcey Penton, MD

## 2024-09-13 ENCOUNTER — Encounter: Payer: Self-pay | Admitting: Internal Medicine

## 2024-09-13 DIAGNOSIS — L4050 Arthropathic psoriasis, unspecified: Secondary | ICD-10-CM | POA: Insufficient documentation

## 2024-09-18 ENCOUNTER — Encounter: Payer: Self-pay | Admitting: Occupational Therapy

## 2024-09-18 ENCOUNTER — Encounter: Payer: Self-pay | Admitting: *Deleted

## 2024-09-18 NOTE — Progress Notes (Signed)
 Ms. Tomei asked if ok to start sulfasalazine prescribed by her rheumatologist and is it ok to take with anastrazole when it is time to begin taking that.   Per Dr. Melanee ok to begin sulfasalazine and there is no contraindication to taking it with anastrazole.

## 2024-09-19 ENCOUNTER — Telehealth: Payer: Self-pay | Admitting: *Deleted

## 2024-09-19 ENCOUNTER — Ambulatory Visit: Admitting: Oncology

## 2024-09-19 ENCOUNTER — Encounter: Payer: Self-pay | Admitting: Surgery

## 2024-09-19 NOTE — Telephone Encounter (Signed)
 Angela Pacheco is having a hard time working full time since she is still packing her breast wound, it is very sore and she is also having a lot of fatigue.   She would like a letter giving an accommodation to work 1/2 days for the next couple of weeks.   Discussed with Dr. Melanee and she prefers this comes from her surgeon's office.   Angela Pacheco has reached out to their office and is waiting for their response.   She will let us  know if they are unwilling to write the accommodation.

## 2024-09-24 ENCOUNTER — Encounter: Payer: Self-pay | Admitting: *Deleted

## 2024-09-24 ENCOUNTER — Encounter: Payer: Self-pay | Admitting: Surgery

## 2024-09-24 ENCOUNTER — Ambulatory Visit (INDEPENDENT_AMBULATORY_CARE_PROVIDER_SITE_OTHER): Admitting: Surgery

## 2024-09-24 VITALS — BP 95/69 | HR 75 | Ht 60.0 in | Wt 118.0 lb

## 2024-09-24 DIAGNOSIS — C50412 Malignant neoplasm of upper-outer quadrant of left female breast: Secondary | ICD-10-CM

## 2024-09-24 DIAGNOSIS — Z9889 Other specified postprocedural states: Secondary | ICD-10-CM | POA: Insufficient documentation

## 2024-09-24 DIAGNOSIS — Z170 Estrogen receptor positive status [ER+]: Secondary | ICD-10-CM

## 2024-09-24 DIAGNOSIS — D171 Benign lipomatous neoplasm of skin and subcutaneous tissue of trunk: Secondary | ICD-10-CM

## 2024-09-24 DIAGNOSIS — Z09 Encounter for follow-up examination after completed treatment for conditions other than malignant neoplasm: Secondary | ICD-10-CM

## 2024-09-24 NOTE — Progress Notes (Signed)
 09/24/2024  HPI: Angela Pacheco is a 58 y.o. female s/p left breast tag localized lumpectomy and sentinel lymph node biopsy for invasive ductal carcinoma with DCIS of the left breast.  She also had an excision of a right chest wall lipoma.  She presents today for follow-up.  She was last seen on 09/05/2024 at which time the right chest wall incision had dehisced.  The edges were cleared and a wet-to-dry packing dressing was applied.  She is continue doing this dressing changes daily and reports just soreness in the right chest wall.  She also reports some soreness in the right axilla and feeling more fatigued.  Last week she had asked for a work excuse to cut down to half days given her fatigue.  Vital signs: BP 95/69   Pulse 75   Ht 5' (1.524 m)   Wt 118 lb (53.5 kg)   BMI 23.05 kg/m    Physical Exam: Constitutional: No acute distress, well-nourished Breast: Left breast incision is healing well and is clean, dry, intact without any evidence of infection.  Left axillary incision is also healing well with some soreness to palpation but no evidence of infection. Skin: Right chest wall incision is healing appropriately and now the granulation tissue is flush with the deep dermis.  It is only missing skin to heal over.  Dry gauze dressing applied.  Assessment/Plan: This is a 58 y.o. female s/p left breast lumpectomy and sentinel lymph node biopsy and right chest wall lipoma excision.  - Discussed with patient that the right chest wall incision does not require wet-to-dry packing changes anymore as the wound is very shallow.  Now she can just do a dry gauze dressing at the surface while allowing the skin to close over the incision. - The patient is healing well from the breast standpoint.  Discussed with her that soreness is not unusual particular at this time that the deep tissue still has inflammatory changes.  The soreness should improve as the tissues continue to heal although there may be  some residual amount of soreness due to likely scar tissue or irritation to the nerves.  At this point she is doing well without any evidence of complications. - Will follow-up with her in 5 months she knows to call sooner if any troubles with the right chest wall wound.   Aloysius Sheree Plant, MD [facility]

## 2024-09-24 NOTE — Patient Instructions (Addendum)
 Continue to keep a clean dry gauze over the area. Change this daily. Shower as usual, remove your dressing first.    Follow-up with our office in 5 months. We will send you a letter about this appointment.   Please call and ask to speak with a nurse if you develop questions or concerns.

## 2024-10-02 ENCOUNTER — Encounter: Payer: Self-pay | Admitting: *Deleted

## 2024-10-02 ENCOUNTER — Ambulatory Visit
Admission: RE | Admit: 2024-10-02 | Discharge: 2024-10-02 | Disposition: A | Discharge status: Home / Self Care | Source: Ambulatory Visit | Attending: Radiation Oncology | Admitting: Radiation Oncology

## 2024-10-02 DIAGNOSIS — Z1722 Progesterone receptor negative status: Secondary | ICD-10-CM | POA: Insufficient documentation

## 2024-10-02 DIAGNOSIS — Z800 Family history of malignant neoplasm of digestive organs: Secondary | ICD-10-CM | POA: Insufficient documentation

## 2024-10-02 DIAGNOSIS — E039 Hypothyroidism, unspecified: Secondary | ICD-10-CM | POA: Insufficient documentation

## 2024-10-02 DIAGNOSIS — Z170 Estrogen receptor positive status [ER+]: Secondary | ICD-10-CM | POA: Insufficient documentation

## 2024-10-02 DIAGNOSIS — Z79811 Long term (current) use of aromatase inhibitors: Secondary | ICD-10-CM | POA: Insufficient documentation

## 2024-10-02 DIAGNOSIS — M129 Arthropathy, unspecified: Secondary | ICD-10-CM | POA: Insufficient documentation

## 2024-10-02 DIAGNOSIS — E7800 Pure hypercholesterolemia, unspecified: Secondary | ICD-10-CM | POA: Insufficient documentation

## 2024-10-02 DIAGNOSIS — Z79890 Hormone replacement therapy (postmenopausal): Secondary | ICD-10-CM | POA: Insufficient documentation

## 2024-10-02 DIAGNOSIS — C50412 Malignant neoplasm of upper-outer quadrant of left female breast: Secondary | ICD-10-CM | POA: Diagnosis present

## 2024-10-02 DIAGNOSIS — Z510 Encounter for antineoplastic radiation therapy: Secondary | ICD-10-CM | POA: Diagnosis present

## 2024-10-02 DIAGNOSIS — Z808 Family history of malignant neoplasm of other organs or systems: Secondary | ICD-10-CM | POA: Insufficient documentation

## 2024-10-02 DIAGNOSIS — Z801 Family history of malignant neoplasm of trachea, bronchus and lung: Secondary | ICD-10-CM | POA: Insufficient documentation

## 2024-10-03 ENCOUNTER — Other Ambulatory Visit: Payer: Self-pay | Admitting: *Deleted

## 2024-10-03 DIAGNOSIS — Z510 Encounter for antineoplastic radiation therapy: Secondary | ICD-10-CM | POA: Diagnosis not present

## 2024-10-03 DIAGNOSIS — C50412 Malignant neoplasm of upper-outer quadrant of left female breast: Secondary | ICD-10-CM

## 2024-10-10 ENCOUNTER — Other Ambulatory Visit: Payer: Self-pay | Admitting: Internal Medicine

## 2024-10-10 ENCOUNTER — Ambulatory Visit
Admission: RE | Admit: 2024-10-10 | Discharge: 2024-10-10 | Disposition: A | Source: Ambulatory Visit | Attending: Radiation Oncology | Admitting: Radiation Oncology

## 2024-10-10 DIAGNOSIS — Z510 Encounter for antineoplastic radiation therapy: Secondary | ICD-10-CM | POA: Diagnosis not present

## 2024-10-11 ENCOUNTER — Other Ambulatory Visit: Payer: Self-pay

## 2024-10-11 ENCOUNTER — Ambulatory Visit
Admission: RE | Admit: 2024-10-11 | Discharge: 2024-10-11 | Disposition: A | Discharge status: Home / Self Care | Source: Ambulatory Visit | Attending: Radiation Oncology | Admitting: Radiation Oncology

## 2024-10-11 DIAGNOSIS — Z510 Encounter for antineoplastic radiation therapy: Secondary | ICD-10-CM | POA: Diagnosis not present

## 2024-10-11 LAB — RAD ONC ARIA SESSION SUMMARY
Course Elapsed Days: 0
Plan Fractions Treated to Date: 1
Plan Prescribed Dose Per Fraction: 2.66 Gy
Plan Total Fractions Prescribed: 16
Plan Total Prescribed Dose: 42.56 Gy
Reference Point Dosage Given to Date: 2.66 Gy
Reference Point Session Dosage Given: 2.66 Gy
Session Number: 1

## 2024-10-12 ENCOUNTER — Other Ambulatory Visit: Payer: Self-pay

## 2024-10-12 ENCOUNTER — Ambulatory Visit
Admission: RE | Admit: 2024-10-12 | Discharge: 2024-10-12 | Disposition: A | Discharge status: Home / Self Care | Source: Ambulatory Visit | Attending: Radiation Oncology | Admitting: Radiation Oncology

## 2024-10-12 DIAGNOSIS — Z510 Encounter for antineoplastic radiation therapy: Secondary | ICD-10-CM | POA: Diagnosis not present

## 2024-10-12 LAB — RAD ONC ARIA SESSION SUMMARY
Course Elapsed Days: 1
Plan Fractions Treated to Date: 2
Plan Prescribed Dose Per Fraction: 2.66 Gy
Plan Total Fractions Prescribed: 16
Plan Total Prescribed Dose: 42.56 Gy
Reference Point Dosage Given to Date: 5.32 Gy
Reference Point Session Dosage Given: 2.66 Gy
Session Number: 2

## 2024-10-15 ENCOUNTER — Other Ambulatory Visit: Payer: Self-pay

## 2024-10-15 ENCOUNTER — Ambulatory Visit
Admission: RE | Admit: 2024-10-15 | Discharge: 2024-10-15 | Disposition: A | Source: Ambulatory Visit | Attending: Radiation Oncology | Admitting: Radiation Oncology

## 2024-10-15 DIAGNOSIS — Z510 Encounter for antineoplastic radiation therapy: Secondary | ICD-10-CM | POA: Diagnosis not present

## 2024-10-15 LAB — RAD ONC ARIA SESSION SUMMARY
Course Elapsed Days: 4
Plan Fractions Treated to Date: 3
Plan Prescribed Dose Per Fraction: 2.66 Gy
Plan Total Fractions Prescribed: 16
Plan Total Prescribed Dose: 42.56 Gy
Reference Point Dosage Given to Date: 7.98 Gy
Reference Point Session Dosage Given: 2.66 Gy
Session Number: 3

## 2024-10-16 ENCOUNTER — Ambulatory Visit
Admission: RE | Admit: 2024-10-16 | Discharge: 2024-10-16 | Disposition: A | Discharge status: Home / Self Care | Source: Ambulatory Visit | Attending: Radiation Oncology | Admitting: Radiation Oncology

## 2024-10-16 ENCOUNTER — Other Ambulatory Visit: Payer: Self-pay

## 2024-10-16 DIAGNOSIS — Z510 Encounter for antineoplastic radiation therapy: Secondary | ICD-10-CM | POA: Diagnosis not present

## 2024-10-16 LAB — RAD ONC ARIA SESSION SUMMARY
Course Elapsed Days: 5
Plan Fractions Treated to Date: 4
Plan Prescribed Dose Per Fraction: 2.66 Gy
Plan Total Fractions Prescribed: 16
Plan Total Prescribed Dose: 42.56 Gy
Reference Point Dosage Given to Date: 10.64 Gy
Reference Point Session Dosage Given: 2.66 Gy
Session Number: 4

## 2024-10-17 ENCOUNTER — Ambulatory Visit
Admission: RE | Admit: 2024-10-17 | Discharge: 2024-10-17 | Disposition: A | Discharge status: Home / Self Care | Source: Ambulatory Visit | Attending: Radiation Oncology | Admitting: Radiation Oncology

## 2024-10-17 ENCOUNTER — Inpatient Hospital Stay: Attending: Oncology

## 2024-10-17 ENCOUNTER — Other Ambulatory Visit: Payer: Self-pay

## 2024-10-17 ENCOUNTER — Ambulatory Visit: Attending: Oncology | Admitting: Occupational Therapy

## 2024-10-17 DIAGNOSIS — Z510 Encounter for antineoplastic radiation therapy: Secondary | ICD-10-CM | POA: Insufficient documentation

## 2024-10-17 DIAGNOSIS — M25612 Stiffness of left shoulder, not elsewhere classified: Secondary | ICD-10-CM | POA: Insufficient documentation

## 2024-10-17 DIAGNOSIS — C50412 Malignant neoplasm of upper-outer quadrant of left female breast: Secondary | ICD-10-CM | POA: Insufficient documentation

## 2024-10-17 LAB — CBC (CANCER CENTER ONLY)
HCT: 42.7 % (ref 36.0–46.0)
Hemoglobin: 14.3 g/dL (ref 12.0–15.0)
MCH: 30.9 pg (ref 26.0–34.0)
MCHC: 33.5 g/dL (ref 30.0–36.0)
MCV: 92.2 fL (ref 80.0–100.0)
Platelet Count: 291 K/uL (ref 150–400)
RBC: 4.63 MIL/uL (ref 3.87–5.11)
RDW: 13 % (ref 11.5–15.5)
WBC Count: 8.4 K/uL (ref 4.0–10.5)
nRBC: 0 % (ref 0.0–0.2)

## 2024-10-17 LAB — RAD ONC ARIA SESSION SUMMARY
Course Elapsed Days: 6
Plan Fractions Treated to Date: 5
Plan Prescribed Dose Per Fraction: 2.66 Gy
Plan Total Fractions Prescribed: 16
Plan Total Prescribed Dose: 42.56 Gy
Reference Point Dosage Given to Date: 13.3 Gy
Reference Point Session Dosage Given: 2.66 Gy
Session Number: 5

## 2024-10-17 NOTE — Therapy (Signed)
 OUTPATIENT OCCUPATIONAL THERAPY BREAST CANCER POSTOP TREATMENT   Patient Name: Angela Pacheco MRN: [id number] DOB:[birth year], 58 y.o., female Today's Date: 10/17/2024  END OF SESSION:  OT End of Session - 10/17/24 1737     Visit Number 2    Number of Visits 4    Date for Recertification  11/28/24    OT Start Time 1432    OT Stop Time 1500    OT Time Calculation (min) 28 min    Activity Tolerance Patient tolerated treatment well    Behavior During Therapy [facility] for tasks assessed/performed          Past Medical History:  Diagnosis Date   Allergy     cats trigger   Arthritis June 2022   Asthma Years ago   mild   B12 deficiency 05/06/2019   Complication of anesthesia    Dysplastic nevus 11/01/2017   Right middle shoulder, mod, excision by Dr Arlyss   Dysplastic nevus 07/03/2020   L medial scapular back, lat to T5, moderate, excision by Dr Arlyss   Dysplastic nevus 06/09/2020   L scapular back lateral, mild   Hypercholesteremia 01/31/2024   Hypothyroidism    Malignant neoplasm of upper-outer quadrant of left breast in female, estrogen receptor positive (HCC) 08/13/2024   Oxygen deficiency 2023   Use mouth sleep appliance. Respire   PONV (postoperative nausea and vomiting)    Sleep apnea 2023   Thyroid  disease 1998   Past Surgical History:  Procedure Laterality Date   ABDOMINAL HYSTERECTOMY     cervix left in place   ANTERIOR CRUCIATE LIGAMENT REPAIR Left 2017   APPENDECTOMY  1991   AXILLARY SENTINEL NODE BIOPSY Left 08/23/2024   Procedure: BIOPSY, LYMPH NODE, SENTINEL, AXILLARY;  Surgeon: Desiderio Schanz, MD;  Location: [facility];  Service: General;  Laterality: Left;   BREAST BIOPSY Left 08/03/2024   US  LT BREAST BX W LOC DEV 1ST LESION IMG BX SPEC US  GUIDE 08/03/2024 [facility]   BREAST BIOPSY Left 08/15/2024   US  LT BREAST SAVI/RF TAG 1ST LESION US  GUIDE 08/15/2024 [facility]   BREAST LUMPECTOMY WITH RADIO FREQUENCY LOCALIZER Left 08/23/2024    Procedure: BREAST LUMPECTOMY WITH RADIO FREQUENCY LOCALIZER;  Surgeon: Desiderio Schanz, MD;  Location: [facility];  Service: General;  Laterality: Left;   CHOLECYSTECTOMY     COLONOSCOPY WITH PROPOFOL  N/A 10/08/2019   Procedure: COLONOSCOPY WITH PROPOFOL ;  Surgeon: Unk Corinn Skiff, MD;  Location: [facility];  Service: Gastroenterology;  Laterality: N/A;   MASS EXCISION Right 08/23/2024   Procedure: EXCISION, MASS, CHEST WALL;  Surgeon: Desiderio Schanz, MD;  Location: [facility];  Service: General;  Laterality: Right;   Patient Active Problem List   Diagnosis Date Noted   S/P left breast biopsy 09/24/2024   Psoriatic arthritis (HCC) 09/13/2024   Lipoma of lateral chest wall 08/23/2024   Genetic testing 08/22/2024   Malignant neoplasm of upper-outer quadrant of left breast in female, estrogen receptor positive (HCC) 08/13/2024   Hypercholesteremia 01/31/2024   Overactive bladder 10/31/2022   Abnormal liver function test 06/22/2022   OSA (obstructive sleep apnea) 05/06/2022   Dizziness 04/18/2022   Daytime sleepiness 03/15/2022   Epigastric pain 02/26/2022   Decreased hearing 01/24/2022   Fatigue 01/24/2022   GERD (gastroesophageal reflux disease) 01/24/2022   Status post shoulder surgery 11/24/2021   Stress 10/11/2021   Right foot pain 04/03/2021   Impingement syndrome of left shoulder region 09/22/2020   Tingling in extremities 09/14/2020   Axillary fullness 06/18/2020   Right shoulder  pain 01/01/2020   B12 deficiency 05/06/2019   Neck fullness 01/20/2019   Family history of lupus erythematosus 10/04/2018   Positive ANA (antinuclear antibody) 10/04/2018   Joint pain 09/15/2018   Immunity to measles determined by serologic test 06/30/2018   Abnormal mammogram 07/07/2016   Health care maintenance 04/05/2015   Environmental allergies 01/17/2015   Asthma 01/17/2015   Menopausal symptoms 01/17/2015   Endometriosis 01/17/2015   Hypothyroidism 01/14/2015    PCP: Dr  Glendia  REFERRING PROVIDER: Dr Melanee  REFERRING DIAG: L lumpectomy   THERAPY DIAG:  Stiffness of left shoulder, not elsewhere classified  Rationale for Evaluation and Treatment: Rehabilitation  ONSET DATE: 08/23/24  SUBJECTIVE:                                                                                                                                                                                           SUBJECTIVE STATEMENT: I started radiation and tried to do some yoga and pilates but it pulled some in my armpit so I hold off - wanted to check with you first   PERTINENT HISTORY:  Assessment/Plan: This is a 58 y.o. female s/p left breast lumpectomy and SLNBx and right chest wall lipoma excision on 08/23/24 by Dr Desiderio  Note from surgeon on 09/05/24   --Patient's right chest wall incision was debrided and cleaned.  No necrosis or purulence.  The wound was packed and dressing instructions given to her and her husband.  Continue this until wound is healed. --From breast standpoint, she's doing well.  Discussed again pathology results.  Oncotype is pending and she has follow up with Dr. Lenn and Dr. Melanee next week. --Follow up in 2 weeks to check on her chest wound  PATIENT GOALS:   reduce lymphedema risk and learn post op HEP.   PAIN:  Are you having pain? Slight pull overhead  PRECAUTIONS: Active CA , lifiting precautions for 6 wks - R lipoma site open and do some wound packing     HAND DOMINANCE: right  WEIGHT BEARING RESTRICTIONS: No  FALLS:  Has patient fallen in last 6 months? 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  Patient very active likes the outdoors activities as well as granddaughter and working out with a [employer] for cardio and strengthening.  Done weights prior to surgery 10 to 20 pounds.   OBJECTIVE:  COGNITION: Overall cognitive status: Within functional  limits for tasks assessed    POSTURE:  Forward head and rounded shoulders posture  UPPER EXTREMITY AROM/PROM: Patient had lipoma removed under  right breast.  Is doing some wound care.  Recommend for patient to stay below 90 degrees for shoulder flexion abduction and external rotation to behind head.  To decrease pulling on incision.  Until healed then can go overhead.  Patient able to get into radiation position with bilateral arms. Left shoulder flexion and abduction 160 degrees.  And external rotation 85 degrees.  With a slight pull over the anterior chest and pec tendon.    CERVICAL AROM: All within normal limits:    UPPER EXTREMITY STRENGTH: Not tested patient 3 to 4 weeks postop  LYMPHEDEMA ASSESSMENTS:      L-DEX LYMPHEDEMA SCREENING:  The patient was assessed using the L-Dex machine today to produce a lymphedema index baseline score. The patient will be reassessed on a regular basis (typically every 3 months) to obtain new L-Dex scores. If the score is > 6.5 points away from his/her baseline score indicating onset of subclinical lymphedema, it will be recommended to wear a compression garment for 4 weeks, 12 hours per day and then be reassessed. If the score continues to be > 6.5 points from baseline at reassessment, we will initiate lymphedema treatment. Assessing in this manner has a 95% rate of preventing clinically significant lymphedema.   L-DEX FLOWSHEETS - 10/17/24 1700       L-DEX LYMPHEDEMA SCREENING   Measurement Type Unilateral    L-DEX MEASUREMENT EXTREMITY Upper Extremity    POSITION  Standing    DOMINANT SIDE Right    At Risk Side Left    BASELINE SCORE (UNILATERAL) 1.2    L-DEX SCORE (UNILATERAL) 0.3    VALUE CHANGE (UNILAT) -0.9         Within normal range    SESSION HEP :  Patient was educated in signs and symptoms as well as precautions and prevention for lymphedema.  Handout provided.  That was in the cancer journal.  As well as article on air  travel that was published by the National lymphedema network.  Circumference of bilateral upper extremity was taken as well as L-Dex score.  Those were within normal range.  Patient had lipoma removed on the right side under the breast.  That she is doing some wound care to them still healing -recommend for patient to stay about 90 degrees for shoulder flexion and abduction on the right side and external rotation behind head. Reviewed with patient home program for active assisted range of motion for left shoulder flexion in supine as well as active assisted range of motion using golf club for left shoulder abduction.  12-15 reps  2 times a day to 3 times a day.  Tolerated well except endrange slight pull.  Recommend for patient to keep pull under 2/10. External rotation behind the head with gravity. Was able to get into radiation position with bilateral arms in supine using gravity. Recommend for patient when lipoma incision is healed and closed patient can transition from home program to active assisted range of motion on the wall for shoulder flexion and abduction.  10 reps to 3 times a day Recommend for patient to follow-up with me 1 more time around first week of radiation.   SESSION 10/17/24:  Pt follow up with great progress in AROM in L shoulder - no pain  Reviewed with pt returning back to work out - increase weight , reps and sets gradually while monitoring her lymphatics in  R UE and quadrant -  Can start yoga and pilates and keep pull or stretch  under 2/10  Verbalize understanding   Pt doing radiation at the moment and 16 session left  Doing well  Done L-Dex that is WNL - see above  Pt to cont with HEP and follow up with breast navigator in 3 months for repeat of L-Dex    PATIENT EDUCATION:  Education details: Lymphedema risk reduction and post op shoulder/posture HEP Person educated: Patient Education method: Explanation, Demonstration, Handout Education comprehension: Patient  verbalized understanding and returned demonstration     ASSESSMENT:  CLINICAL IMPRESSION: Pt had Left lumpectomy on 08/23/2024 by Dr. Desiderio patient also had a lipoma removed on the right side under the chest.  Pt doing well and AROM WNL and strength returning - did not return to workout yet - reviewed with pt how to advance her strengthening and yoga/pilates- as well as L-Dex score WNL - pt started radiation and have still 16 session left - doing well - pt to cont at home with homeprogram and can follow up with breast navigator in 3 months to repead L-Dex screens every 3 months for 2 years to detect subclinical lymphedema.  Pt will benefit from skilled therapeutic intervention to improve on the following deficits: Decreased knowledge of precautions and lymphedema education, impaired UE functional use, pain, decreased ROM, postural dysfunction.   OT treatment/interventions: ADL/self-care home management, pt/family education, therapeutic exercise,manual therapy  REHAB POTENTIAL: Good  CLINICAL DECISION MAKING: Stable/uncomplicated  EVALUATION COMPLEXITY: Low   GOALS: Goals reviewed with patient? YES  LONG TERM GOALS: (STG=LTG)    Name Target Date Goal status  1 Pt will be able to verbalize understanding of pertinent lymphedema risk reduction practices relevant to her dx specifically related to skin care.  Baseline:  No knowledge 6 weeks met  2 Pt will be able to return demo and/or verbalize understanding of the post op HEP related to regaining shoulder ROM. Baseline:  No knowledge Today Achieved at eval       3 Pt will demo she has regained full shoulder ROM and function post operatively compared to baselines.  Baseline: See objective measurements taken today. 6 weeks met    PLAN:  OT FREQUENCY/DURATION: EVAL and 2-3 visits for 12 wks  PLAN FOR NEXT SESSION:  Occupational Therapy Information for After Breast Cancer Surgery/Treatment:  Lymphedema is a swelling condition that  you may be at risk for in your arm if you have lymph nodes removed from the armpit area.  After a sentinel node biopsy, the risk is approximately 5-9% and is higher after an axillary node dissection.  There is treatment available for this condition and it is not life-threatening.  Contact your physician or occupational therapist with concerns. You may begin the 4 shoulder/posture exercises (see additional sheet) when permitted by your physician (typically a week after surgery).  If you have drains, you may need to wait until those are removed before beginning range of motion exercises.  A general recommendation is to not lift your arms above shoulder height until drains are removed.  These exercises should be done to your tolerance and gently.  This is not a no pain/no gain type of recovery so listen to your body and stretch into the range of motion that you can tolerate, stopping if you have pain.  If you are having immediate reconstruction, ask your plastic surgeon about doing exercises as he or she may want you to wait. .  While undergoing any medical procedure or treatment, try to avoid blood pressure being taken or needle sticks from  occurring on the arm on the side of cancer.   This recommendation begins after surgery and continues for the rest of your life.  This may help reduce your risk of getting lymphedema (swelling in your arm). An excellent resource for those seeking information on lymphedema is the National Lymphedema Network's web site. It can be accessed at www.lymphnet.org If you notice swelling in your hand, arm or breast at any time following surgery (even if it is many years from now), please contact your doctor or occupational therapist to discuss this.  Lymphedema can be treated at any time but it is easier for you if it is treated early on.  If you feel like your shoulder motion is not returning to normal in a reasonable amount of time, please contact your surgeon or occupational  therapist.  [facility] [phone]. [address] [place], [place] [address]       Ancel Peters, OTR/L,CLT 10/17/2024, 5:39 PM

## 2024-10-18 ENCOUNTER — Ambulatory Visit
Admission: RE | Admit: 2024-10-18 | Discharge: 2024-10-18 | Disposition: A | Source: Ambulatory Visit | Attending: Radiation Oncology | Admitting: Radiation Oncology

## 2024-10-18 ENCOUNTER — Other Ambulatory Visit: Payer: Self-pay

## 2024-10-18 DIAGNOSIS — Z510 Encounter for antineoplastic radiation therapy: Secondary | ICD-10-CM | POA: Diagnosis not present

## 2024-10-18 LAB — RAD ONC ARIA SESSION SUMMARY
Course Elapsed Days: 7
Plan Fractions Treated to Date: 6
Plan Prescribed Dose Per Fraction: 2.66 Gy
Plan Total Fractions Prescribed: 16
Plan Total Prescribed Dose: 42.56 Gy
Reference Point Dosage Given to Date: 15.96 Gy
Reference Point Session Dosage Given: 2.66 Gy
Session Number: 6

## 2024-10-19 ENCOUNTER — Encounter: Payer: Self-pay | Admitting: *Deleted

## 2024-10-19 ENCOUNTER — Other Ambulatory Visit: Payer: Self-pay

## 2024-10-19 ENCOUNTER — Ambulatory Visit
Admission: RE | Admit: 2024-10-19 | Discharge: 2024-10-19 | Disposition: A | Discharge status: Home / Self Care | Source: Ambulatory Visit | Attending: Radiation Oncology | Admitting: Radiation Oncology

## 2024-10-19 DIAGNOSIS — Z510 Encounter for antineoplastic radiation therapy: Secondary | ICD-10-CM | POA: Diagnosis not present

## 2024-10-19 LAB — RAD ONC ARIA SESSION SUMMARY
Course Elapsed Days: 8
Plan Fractions Treated to Date: 7
Plan Prescribed Dose Per Fraction: 2.66 Gy
Plan Total Fractions Prescribed: 16
Plan Total Prescribed Dose: 42.56 Gy
Reference Point Dosage Given to Date: 18.62 Gy
Reference Point Session Dosage Given: 2.66 Gy
Session Number: 7

## 2024-10-22 ENCOUNTER — Other Ambulatory Visit: Payer: Self-pay

## 2024-10-22 ENCOUNTER — Ambulatory Visit
Admission: RE | Admit: 2024-10-22 | Discharge: 2024-10-22 | Disposition: A | Source: Ambulatory Visit | Attending: Radiation Oncology | Admitting: Radiation Oncology

## 2024-10-22 ENCOUNTER — Ambulatory Visit
Admission: RE | Admit: 2024-10-22 | Discharge: 2024-10-22 | Disposition: A | Discharge status: Home / Self Care | Source: Ambulatory Visit | Attending: Radiation Oncology | Admitting: Radiation Oncology

## 2024-10-22 DIAGNOSIS — Z510 Encounter for antineoplastic radiation therapy: Secondary | ICD-10-CM | POA: Diagnosis not present

## 2024-10-22 LAB — RAD ONC ARIA SESSION SUMMARY
Course Elapsed Days: 11
Plan Fractions Treated to Date: 8
Plan Prescribed Dose Per Fraction: 2.66 Gy
Plan Total Fractions Prescribed: 16
Plan Total Prescribed Dose: 42.56 Gy
Reference Point Dosage Given to Date: 21.28 Gy
Reference Point Session Dosage Given: 2.66 Gy
Session Number: 8

## 2024-10-23 ENCOUNTER — Ambulatory Visit
Admission: RE | Admit: 2024-10-23 | Discharge: 2024-10-23 | Disposition: A | Source: Ambulatory Visit | Attending: Radiation Oncology | Admitting: Radiation Oncology

## 2024-10-23 ENCOUNTER — Encounter: Payer: Self-pay | Admitting: *Deleted

## 2024-10-23 ENCOUNTER — Other Ambulatory Visit: Payer: Self-pay

## 2024-10-23 ENCOUNTER — Ambulatory Visit

## 2024-10-23 DIAGNOSIS — Z510 Encounter for antineoplastic radiation therapy: Secondary | ICD-10-CM | POA: Diagnosis not present

## 2024-10-23 LAB — RAD ONC ARIA SESSION SUMMARY
Course Elapsed Days: 12
Plan Fractions Treated to Date: 9
Plan Prescribed Dose Per Fraction: 2.66 Gy
Plan Total Fractions Prescribed: 16
Plan Total Prescribed Dose: 42.56 Gy
Reference Point Dosage Given to Date: 23.94 Gy
Reference Point Session Dosage Given: 2.66 Gy
Session Number: 9

## 2024-10-24 ENCOUNTER — Ambulatory Visit
Admission: RE | Admit: 2024-10-24 | Discharge: 2024-10-24 | Disposition: A | Source: Ambulatory Visit | Attending: Radiation Oncology | Admitting: Radiation Oncology

## 2024-10-24 ENCOUNTER — Other Ambulatory Visit: Payer: Self-pay

## 2024-10-24 DIAGNOSIS — Z510 Encounter for antineoplastic radiation therapy: Secondary | ICD-10-CM | POA: Diagnosis not present

## 2024-10-24 LAB — RAD ONC ARIA SESSION SUMMARY
Course Elapsed Days: 13
Plan Fractions Treated to Date: 10
Plan Prescribed Dose Per Fraction: 2.66 Gy
Plan Total Fractions Prescribed: 16
Plan Total Prescribed Dose: 42.56 Gy
Reference Point Dosage Given to Date: 26.6 Gy
Reference Point Session Dosage Given: 2.66 Gy
Session Number: 10

## 2024-10-25 ENCOUNTER — Other Ambulatory Visit: Payer: Self-pay

## 2024-10-25 ENCOUNTER — Ambulatory Visit
Admission: RE | Admit: 2024-10-25 | Discharge: 2024-10-25 | Disposition: A | Source: Ambulatory Visit | Attending: Radiation Oncology | Admitting: Radiation Oncology

## 2024-10-25 DIAGNOSIS — Z510 Encounter for antineoplastic radiation therapy: Secondary | ICD-10-CM | POA: Diagnosis not present

## 2024-10-25 LAB — RAD ONC ARIA SESSION SUMMARY
Course Elapsed Days: 14
Plan Fractions Treated to Date: 11
Plan Prescribed Dose Per Fraction: 2.66 Gy
Plan Total Fractions Prescribed: 16
Plan Total Prescribed Dose: 42.56 Gy
Reference Point Dosage Given to Date: 29.26 Gy
Reference Point Session Dosage Given: 2.66 Gy
Session Number: 11

## 2024-10-26 ENCOUNTER — Ambulatory Visit
Admission: RE | Admit: 2024-10-26 | Discharge: 2024-10-26 | Disposition: A | Discharge status: Home / Self Care | Source: Ambulatory Visit | Attending: Radiation Oncology | Admitting: Radiation Oncology

## 2024-10-26 ENCOUNTER — Other Ambulatory Visit: Payer: Self-pay

## 2024-10-26 DIAGNOSIS — Z510 Encounter for antineoplastic radiation therapy: Secondary | ICD-10-CM | POA: Diagnosis not present

## 2024-10-26 LAB — RAD ONC ARIA SESSION SUMMARY
Course Elapsed Days: 15
Plan Fractions Treated to Date: 12
Plan Prescribed Dose Per Fraction: 2.66 Gy
Plan Total Fractions Prescribed: 16
Plan Total Prescribed Dose: 42.56 Gy
Reference Point Dosage Given to Date: 31.92 Gy
Reference Point Session Dosage Given: 2.66 Gy
Session Number: 12

## 2024-10-29 ENCOUNTER — Ambulatory Visit
Admission: RE | Admit: 2024-10-29 | Discharge: 2024-10-29 | Disposition: A | Discharge status: Home / Self Care | Source: Ambulatory Visit | Attending: Radiation Oncology | Admitting: Radiation Oncology

## 2024-10-29 ENCOUNTER — Other Ambulatory Visit: Payer: Self-pay

## 2024-10-29 DIAGNOSIS — C50412 Malignant neoplasm of upper-outer quadrant of left female breast: Secondary | ICD-10-CM | POA: Diagnosis present

## 2024-10-29 DIAGNOSIS — Z800 Family history of malignant neoplasm of digestive organs: Secondary | ICD-10-CM | POA: Insufficient documentation

## 2024-10-29 DIAGNOSIS — M129 Arthropathy, unspecified: Secondary | ICD-10-CM | POA: Insufficient documentation

## 2024-10-29 DIAGNOSIS — E039 Hypothyroidism, unspecified: Secondary | ICD-10-CM | POA: Insufficient documentation

## 2024-10-29 DIAGNOSIS — E7800 Pure hypercholesterolemia, unspecified: Secondary | ICD-10-CM | POA: Insufficient documentation

## 2024-10-29 DIAGNOSIS — Z510 Encounter for antineoplastic radiation therapy: Secondary | ICD-10-CM | POA: Diagnosis present

## 2024-10-29 DIAGNOSIS — Z801 Family history of malignant neoplasm of trachea, bronchus and lung: Secondary | ICD-10-CM | POA: Insufficient documentation

## 2024-10-29 DIAGNOSIS — Z1722 Progesterone receptor negative status: Secondary | ICD-10-CM | POA: Insufficient documentation

## 2024-10-29 DIAGNOSIS — Z808 Family history of malignant neoplasm of other organs or systems: Secondary | ICD-10-CM | POA: Insufficient documentation

## 2024-10-29 DIAGNOSIS — Z79890 Hormone replacement therapy (postmenopausal): Secondary | ICD-10-CM | POA: Insufficient documentation

## 2024-10-29 DIAGNOSIS — Z79811 Long term (current) use of aromatase inhibitors: Secondary | ICD-10-CM | POA: Insufficient documentation

## 2024-10-29 DIAGNOSIS — Z170 Estrogen receptor positive status [ER+]: Secondary | ICD-10-CM | POA: Diagnosis present

## 2024-10-29 LAB — RAD ONC ARIA SESSION SUMMARY
Course Elapsed Days: 18
Plan Fractions Treated to Date: 13
Plan Prescribed Dose Per Fraction: 2.66 Gy
Plan Total Fractions Prescribed: 16
Plan Total Prescribed Dose: 42.56 Gy
Reference Point Dosage Given to Date: 34.58 Gy
Reference Point Session Dosage Given: 2.66 Gy
Session Number: 13

## 2024-10-30 ENCOUNTER — Ambulatory Visit
Admission: RE | Admit: 2024-10-30 | Discharge: 2024-10-30 | Disposition: A | Discharge status: Home / Self Care | Source: Ambulatory Visit | Attending: Radiation Oncology | Admitting: Radiation Oncology

## 2024-10-30 ENCOUNTER — Other Ambulatory Visit: Payer: Self-pay

## 2024-10-30 DIAGNOSIS — Z510 Encounter for antineoplastic radiation therapy: Secondary | ICD-10-CM | POA: Diagnosis not present

## 2024-10-30 LAB — RAD ONC ARIA SESSION SUMMARY
Course Elapsed Days: 19
Plan Fractions Treated to Date: 14
Plan Prescribed Dose Per Fraction: 2.66 Gy
Plan Total Fractions Prescribed: 16
Plan Total Prescribed Dose: 42.56 Gy
Reference Point Dosage Given to Date: 37.24 Gy
Reference Point Session Dosage Given: 2.66 Gy
Session Number: 14

## 2024-10-31 ENCOUNTER — Inpatient Hospital Stay: Attending: Oncology

## 2024-10-31 ENCOUNTER — Ambulatory Visit
Admission: RE | Admit: 2024-10-31 | Discharge: 2024-10-31 | Disposition: A | Source: Ambulatory Visit | Attending: Radiation Oncology | Admitting: Radiation Oncology

## 2024-10-31 ENCOUNTER — Other Ambulatory Visit: Payer: Self-pay

## 2024-10-31 DIAGNOSIS — Z170 Estrogen receptor positive status [ER+]: Secondary | ICD-10-CM | POA: Insufficient documentation

## 2024-10-31 DIAGNOSIS — C50412 Malignant neoplasm of upper-outer quadrant of left female breast: Secondary | ICD-10-CM | POA: Insufficient documentation

## 2024-10-31 DIAGNOSIS — Z510 Encounter for antineoplastic radiation therapy: Secondary | ICD-10-CM | POA: Diagnosis not present

## 2024-10-31 LAB — RAD ONC ARIA SESSION SUMMARY
Course Elapsed Days: 20
Plan Fractions Treated to Date: 15
Plan Prescribed Dose Per Fraction: 2.66 Gy
Plan Total Fractions Prescribed: 16
Plan Total Prescribed Dose: 42.56 Gy
Reference Point Dosage Given to Date: 39.9 Gy
Reference Point Session Dosage Given: 2.66 Gy
Session Number: 15

## 2024-10-31 LAB — CBC (CANCER CENTER ONLY)
HCT: 41.6 % (ref 36.0–46.0)
Hemoglobin: 14.2 g/dL (ref 12.0–15.0)
MCH: 31.1 pg (ref 26.0–34.0)
MCHC: 34.1 g/dL (ref 30.0–36.0)
MCV: 91.2 fL (ref 80.0–100.0)
Platelet Count: 263 K/uL (ref 150–400)
RBC: 4.56 MIL/uL (ref 3.87–5.11)
RDW: 12.8 % (ref 11.5–15.5)
WBC Count: 6.7 K/uL (ref 4.0–10.5)
nRBC: 0 % (ref 0.0–0.2)

## 2024-11-01 ENCOUNTER — Ambulatory Visit
Admission: RE | Admit: 2024-11-01 | Discharge: 2024-11-01 | Disposition: A | Discharge status: Home / Self Care | Source: Ambulatory Visit | Attending: Radiation Oncology | Admitting: Radiation Oncology

## 2024-11-01 ENCOUNTER — Other Ambulatory Visit: Payer: Self-pay

## 2024-11-01 DIAGNOSIS — Z510 Encounter for antineoplastic radiation therapy: Secondary | ICD-10-CM | POA: Diagnosis not present

## 2024-11-01 LAB — RAD ONC ARIA SESSION SUMMARY
Course Elapsed Days: 21
Plan Fractions Treated to Date: 16
Plan Prescribed Dose Per Fraction: 2.66 Gy
Plan Total Fractions Prescribed: 16
Plan Total Prescribed Dose: 42.56 Gy
Reference Point Dosage Given to Date: 42.56 Gy
Reference Point Session Dosage Given: 2.66 Gy
Session Number: 16

## 2024-11-02 ENCOUNTER — Ambulatory Visit
Admission: RE | Admit: 2024-11-02 | Discharge: 2024-11-02 | Disposition: A | Discharge status: Home / Self Care | Source: Ambulatory Visit | Attending: Radiation Oncology | Admitting: Radiation Oncology

## 2024-11-02 ENCOUNTER — Other Ambulatory Visit: Payer: Self-pay

## 2024-11-02 DIAGNOSIS — Z510 Encounter for antineoplastic radiation therapy: Secondary | ICD-10-CM | POA: Diagnosis not present

## 2024-11-02 LAB — RAD ONC ARIA SESSION SUMMARY
Course Elapsed Days: 22
Plan Fractions Treated to Date: 1
Plan Prescribed Dose Per Fraction: 2 Gy
Plan Total Fractions Prescribed: 5
Plan Total Prescribed Dose: 10 Gy
Reference Point Dosage Given to Date: 2 Gy
Reference Point Session Dosage Given: 2 Gy
Session Number: 17

## 2024-11-05 ENCOUNTER — Other Ambulatory Visit: Payer: Self-pay

## 2024-11-05 ENCOUNTER — Ambulatory Visit
Admission: RE | Admit: 2024-11-05 | Discharge: 2024-11-05 | Disposition: A | Discharge status: Home / Self Care | Source: Ambulatory Visit | Attending: Radiation Oncology | Admitting: Radiation Oncology

## 2024-11-05 DIAGNOSIS — Z510 Encounter for antineoplastic radiation therapy: Secondary | ICD-10-CM | POA: Diagnosis not present

## 2024-11-05 LAB — RAD ONC ARIA SESSION SUMMARY
Course Elapsed Days: 25
Plan Fractions Treated to Date: 2
Plan Prescribed Dose Per Fraction: 2 Gy
Plan Total Fractions Prescribed: 5
Plan Total Prescribed Dose: 10 Gy
Reference Point Dosage Given to Date: 4 Gy
Reference Point Session Dosage Given: 2 Gy
Session Number: 18

## 2024-11-06 ENCOUNTER — Ambulatory Visit
Admission: RE | Admit: 2024-11-06 | Discharge: 2024-11-06 | Disposition: A | Discharge status: Home / Self Care | Source: Ambulatory Visit | Attending: Radiation Oncology | Admitting: Radiation Oncology

## 2024-11-06 ENCOUNTER — Other Ambulatory Visit: Payer: Self-pay

## 2024-11-06 DIAGNOSIS — Z510 Encounter for antineoplastic radiation therapy: Secondary | ICD-10-CM | POA: Diagnosis not present

## 2024-11-06 LAB — RAD ONC ARIA SESSION SUMMARY
Course Elapsed Days: 26
Plan Fractions Treated to Date: 3
Plan Prescribed Dose Per Fraction: 2 Gy
Plan Total Fractions Prescribed: 5
Plan Total Prescribed Dose: 10 Gy
Reference Point Dosage Given to Date: 6 Gy
Reference Point Session Dosage Given: 2 Gy
Session Number: 19

## 2024-11-07 ENCOUNTER — Other Ambulatory Visit: Payer: Self-pay

## 2024-11-07 ENCOUNTER — Ambulatory Visit
Admission: RE | Admit: 2024-11-07 | Discharge: 2024-11-07 | Disposition: A | Discharge status: Home / Self Care | Source: Ambulatory Visit | Attending: Radiation Oncology | Admitting: Radiation Oncology

## 2024-11-07 DIAGNOSIS — Z510 Encounter for antineoplastic radiation therapy: Secondary | ICD-10-CM | POA: Diagnosis not present

## 2024-11-07 LAB — RAD ONC ARIA SESSION SUMMARY
Course Elapsed Days: 27
Plan Fractions Treated to Date: 4
Plan Prescribed Dose Per Fraction: 2 Gy
Plan Total Fractions Prescribed: 5
Plan Total Prescribed Dose: 10 Gy
Reference Point Dosage Given to Date: 8 Gy
Reference Point Session Dosage Given: 2 Gy
Session Number: 20

## 2024-11-08 ENCOUNTER — Other Ambulatory Visit: Payer: Self-pay

## 2024-11-08 ENCOUNTER — Ambulatory Visit
Admission: RE | Admit: 2024-11-08 | Discharge: 2024-11-08 | Disposition: A | Discharge status: Home / Self Care | Source: Ambulatory Visit | Attending: Radiation Oncology | Admitting: Radiation Oncology

## 2024-11-08 DIAGNOSIS — Z510 Encounter for antineoplastic radiation therapy: Secondary | ICD-10-CM | POA: Diagnosis not present

## 2024-11-08 LAB — RAD ONC ARIA SESSION SUMMARY
Course Elapsed Days: 28
Plan Fractions Treated to Date: 5
Plan Prescribed Dose Per Fraction: 2 Gy
Plan Total Fractions Prescribed: 5
Plan Total Prescribed Dose: 10 Gy
Reference Point Dosage Given to Date: 10 Gy
Reference Point Session Dosage Given: 2 Gy
Session Number: 21

## 2024-11-09 ENCOUNTER — Encounter: Payer: Self-pay | Admitting: *Deleted

## 2024-11-09 NOTE — Radiation Completion Notes (Signed)
 Patient Name: Angela Pacheco, Angela Pacheco MRN: [id number] Date of Birth: [birth year] Referring Physician: ALLENA HAMILTON, M.D. Date of Service: 2024-11-09 Radiation Oncologist: Marcey Penton, M.D. [facility]                             RADIATION ONCOLOGY END OF TREATMENT NOTE     Diagnosis: C50.412 Malignant neoplasm of upper-outer quadrant of left female breast Staging on 2024-09-12: Malignant neoplasm of upper-outer quadrant of left breast in female, estrogen receptor positive (HCC) T=pT1b, N=pN0, M=cM0 Staging on 2024-08-13: Malignant neoplasm of upper-outer quadrant of left breast in female, estrogen receptor positive (HCC) T=cT1a, N=cN0, M=cM0 Intent: Curative     HPI: Patient is a 58 year old femaleWho presented with an abnormal mammogram of her left breast showing suspicious 5 mm mass with associated architectural distortion.  There is no suspicious left axillary adenopathy.  Patient went ultrasound-guided biopsy which was positive for invasive ductal carcinoma overall grade 1.  She went on to have a wide local excision showing overall grade one 1 cm invasive mammary carcinoma of no special type.  All margins were negative distance to closest margin was 7 mm.  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  DCIS were also negative closest margin 8 mm.  2 sentinel lymph nodes were examined both negative for malignancy.  Tumor was ER positive PR negative HER2/neu not overexpressed.  She has done well postoperatively.  She specifically denies breast tenderness cough or bone pain.  Her Oncotype DX came back at 66 she is seeing Dr. Melanee although I believe she will be deferred for systemic treatment.      ==========DELIVERED PLANS==========  First Treatment Date: 2024-10-11 Last Treatment Date: 2024-11-08   Plan Name: Breast_L_BH Site: Breast, Left Technique: 3D Mode: Photon Dose Per Fraction: 2.66 Gy Prescribed Dose (Delivered / Prescribed): 42.56 Gy / 42.56 Gy Prescribed Fxs  (Delivered / Prescribed): 16 / 16   Plan Name: Breast_L_Bst Site: Breast, Left Technique: 3D Mode: Photon Dose Per Fraction: 2 Gy Prescribed Dose (Delivered / Prescribed): 10 Gy / 10 Gy Prescribed Fxs (Delivered / Prescribed): 5 / 5     ==========ON TREATMENT VISIT DATES========== 2024-10-16, 2024-10-23, 2024-10-30, 2024-11-06     ==========UPCOMING VISITS========== 02/01/2025 [facility] OFFICE VISIT Hamilton Allena, MD  01/10/2025 [facility] ON[facility] SOZO SCREEN Georgina Shasta POUR, RN  01/01/2025 [facility] EST PT Melanee Annah BROCKS, MD  01/01/2025 [facility] ON[facility] LAB [facility]  12/12/2024 [facility] RAD ONCOLOGY FOLLOW UP 30 Chrystal, Marcey, MD        ==========APPENDIX - ON TREATMENT VISIT NOTES==========   See weekly On Treatment Notes in Epic for details in the Media tab (listed as Progress notes on the On Treatment Visit Dates listed above).

## 2024-12-05 ENCOUNTER — Encounter: Payer: Self-pay | Admitting: *Deleted

## 2024-12-05 NOTE — Progress Notes (Signed)
 Angela Pacheco called stating that her joint pain has gotten worse since starting her anastrazole over Thanksgiving break.  She has also noticed some brain fog.   She is meeting with rheumatology next week.  Per Dr. Melanee, Angela Pacheco could stop the anastrazole for 3 weeks and start back.  Angela Pacheco is hesitant to stop it and then start all over again.  She is going to discuss with rheumatology and try to stay on the medication at least until her next follow up with Dr. Melanee on 01/01/25.

## 2024-12-12 ENCOUNTER — Encounter: Payer: Self-pay | Admitting: Radiation Oncology

## 2024-12-12 ENCOUNTER — Ambulatory Visit
Admission: RE | Admit: 2024-12-12 | Discharge: 2024-12-12 | Disposition: A | Discharge status: Home / Self Care | Source: Ambulatory Visit | Attending: Radiation Oncology | Admitting: Radiation Oncology

## 2024-12-12 VITALS — BP 113/83 | HR 73 | Temp 98.3°F | Resp 14 | Ht 60.0 in | Wt 118.0 lb

## 2024-12-12 DIAGNOSIS — Z170 Estrogen receptor positive status [ER+]: Secondary | ICD-10-CM | POA: Diagnosis not present

## 2024-12-12 DIAGNOSIS — C50412 Malignant neoplasm of upper-outer quadrant of left female breast: Secondary | ICD-10-CM | POA: Insufficient documentation

## 2024-12-12 DIAGNOSIS — Z79811 Long term (current) use of aromatase inhibitors: Secondary | ICD-10-CM | POA: Diagnosis not present

## 2024-12-12 DIAGNOSIS — Z923 Personal history of irradiation: Secondary | ICD-10-CM | POA: Diagnosis not present

## 2024-12-12 NOTE — Progress Notes (Signed)
 Radiation Oncology Follow up Note  Name: Angela Pacheco   Date:   12/12/2024 MRN:  [id number] DOB: 05/09/1966    This 58 y.o. female presents to the clinic today for 1 month follow-up status post whole breast radiation to her left breast for stage Ia (pT1b N0 M0) ER positive invasive mammary carcinoma status post wide local excision.SABRA  REFERRING PROVIDER: Glendia Shad, MD  HPI: Patient is a 58 year old female now out 1 month having completed whole breast radiation to her left breast for stage Ia invasive mammary carcinoma ER positive.  Seen today in routine follow-up she still quite fatigued.  She also is having some symptoms of fullness and shooting pains in her left breast which I explained is perfectly normal after surgery and radiation.  She has been started on.  Arimidex  and she believes this is exacerbating some of her rheumatoid arthritis.  COMPLICATIONS OF TREATMENT: none  FOLLOW UP COMPLIANCE: keeps appointments   PHYSICAL EXAM:  BP 113/83   Pulse 73   Temp 98.3 F (36.8 C) (Tympanic)   Resp 14   Ht 5' (1.524 m)   Wt 118 lb (53.5 kg)   BMI 23.05 kg/m  Lungs are clear to A&P cardiac examination essentially unremarkable with regular rate and rhythm. No dominant mass or nodularity is noted in either breast in 2 positions examined. Incision is well-healed. No axillary or supraclavicular adenopathy is appreciated. Cosmetic result is excellent.  Well-developed well-nourished patient in NAD. HEENT reveals PERLA, EOMI, discs not visualized.  Oral cavity is clear. No oral mucosal lesions are identified. Neck is clear without evidence of cervical or supraclavicular adenopathy. Lungs are clear to A&P. Cardiac examination is essentially unremarkable with regular rate and rhythm without murmur rub or thrill. Abdomen is benign with no organomegaly or masses noted. Motor sensory and DTR levels are equal and symmetric in the upper and lower extremities. Cranial nerves II through XII are  grossly intact. Proprioception is intact. No peripheral adenopathy or edema is identified. No motor or sensory levels are noted. Crude visual fields are within normal range.  RADIOLOGY RESULTS: No current films for review  PLAN: Present time patient is doing well.  Excellent cosmetic result.  I explained to her the reasons why she is having pain in her breast and some discomfort.  I have assured her this will ease over time.  She continues on Arimidex .  She is seeing her rheumatoid arthritis doctor for some of her increased joint pain.  I have asked to see her back in 6 months for follow-up.  Patient knows to call sooner with any concerns.  I would like to take this opportunity to thank you for allowing me to participate in the care of your patient.SABRA Marcey Penton, MD

## 2024-12-14 ENCOUNTER — Telehealth: Payer: Self-pay | Admitting: *Deleted

## 2024-12-14 NOTE — Telephone Encounter (Signed)
 Please schedule the bone density so that I can have the results when I see her on 01/01/2025

## 2024-12-14 NOTE — Telephone Encounter (Signed)
 Patient left vm for triage to request an apt for her bone density test. She stated that she can see that the bone density has been ordered but she has never had an apt to get this set up. Please return patient's phone call to further discuss.

## 2024-12-24 ENCOUNTER — Ambulatory Visit: Admitting: Oncology

## 2024-12-24 ENCOUNTER — Other Ambulatory Visit

## 2024-12-28 ENCOUNTER — Other Ambulatory Visit: Payer: Self-pay | Admitting: Internal Medicine

## 2024-12-28 NOTE — Telephone Encounter (Signed)
 Can 1 of you please move her appointment with me a week or 2 after her bone density scan?

## 2025-01-01 ENCOUNTER — Inpatient Hospital Stay: Admitting: Oncology

## 2025-01-01 ENCOUNTER — Inpatient Hospital Stay

## 2025-01-01 ENCOUNTER — Encounter: Payer: Self-pay | Admitting: Oncology

## 2025-01-02 ENCOUNTER — Ambulatory Visit
Admission: RE | Admit: 2025-01-02 | Discharge: 2025-01-02 | Disposition: A | Discharge status: Home / Self Care | Source: Ambulatory Visit | Attending: Oncology | Admitting: Oncology

## 2025-01-02 DIAGNOSIS — C50412 Malignant neoplasm of upper-outer quadrant of left female breast: Secondary | ICD-10-CM | POA: Diagnosis present

## 2025-01-02 DIAGNOSIS — Z170 Estrogen receptor positive status [ER+]: Secondary | ICD-10-CM | POA: Insufficient documentation

## 2025-01-10 ENCOUNTER — Inpatient Hospital Stay: Attending: Oncology | Admitting: *Deleted

## 2025-01-10 DIAGNOSIS — Z170 Estrogen receptor positive status [ER+]: Secondary | ICD-10-CM

## 2025-01-10 DIAGNOSIS — Z9189 Other specified personal risk factors, not elsewhere classified: Secondary | ICD-10-CM

## 2025-01-10 NOTE — Progress Notes (Signed)
 SUBJECTIVE: Pt returns for her 3 month L-Dex screen.    PAIN:  Are you having pain? No   SOZO SCREENING: Patient was assessed today using the SOZO machine to determine the lymphedema index score. This was compared to her baseline score. It was determined that she is within the recommended range when compared to her baseline and no further action is needed at this time. She will continue SOZO screenings. These are done every 3 months for 2 years post operatively followed by every 6 months for 2 years, and then annually.     L-DEX FLOWSHEETS                L-DEX LYMPHEDEMA SCREENING    Measurement Type Unilateral     L-DEX MEASUREMENT EXTREMITY Upper Extremity     POSITION  Standing     DOMINANT SIDE Right     At Risk Side Left     BASELINE SCORE (UNILATERAL) 1.2    L-DEX SCORE (UNILATERAL) 5.5    VALUE CHANGE (UNILAT) 4.3

## 2025-01-10 NOTE — Progress Notes (Signed)
 Survivorship Care Plan visit completed.  Treatment summary reviewed and given to patient.  ASCO answers booklet reviewed and given to patient.  CARE program and Cancer Transitions discussed with patient along with other resources cancer center offers to patients and caregivers.  Patient verbalized understanding.

## 2025-01-15 ENCOUNTER — Inpatient Hospital Stay

## 2025-01-15 ENCOUNTER — Inpatient Hospital Stay: Admitting: Oncology

## 2025-01-15 ENCOUNTER — Encounter: Payer: Self-pay | Admitting: Oncology

## 2025-01-15 VITALS — BP 101/79 | HR 92 | Temp 97.0°F | Resp 18 | Ht 60.0 in | Wt 119.5 lb

## 2025-01-15 DIAGNOSIS — C50412 Malignant neoplasm of upper-outer quadrant of left female breast: Secondary | ICD-10-CM

## 2025-01-15 DIAGNOSIS — M810 Age-related osteoporosis without current pathological fracture: Secondary | ICD-10-CM

## 2025-01-15 DIAGNOSIS — Z170 Estrogen receptor positive status [ER+]: Secondary | ICD-10-CM | POA: Diagnosis not present

## 2025-01-15 LAB — CMP (CANCER CENTER ONLY)
ALT: 19 U/L (ref 0–44)
AST: 24 U/L (ref 15–41)
Albumin: 4.4 g/dL (ref 3.5–5.0)
Alkaline Phosphatase: 73 U/L (ref 38–126)
Anion gap: 10 (ref 5–15)
BUN: 15 mg/dL (ref 6–20)
CO2: 26 mmol/L (ref 22–32)
Calcium: 9.6 mg/dL (ref 8.9–10.3)
Chloride: 105 mmol/L (ref 98–111)
Creatinine: 0.85 mg/dL (ref 0.44–1.00)
GFR, Estimated: >60 mL/min
Glucose, Bld: 85 mg/dL (ref 70–99)
Potassium: 4.1 mmol/L (ref 3.5–5.1)
Sodium: 142 mmol/L (ref 135–145)
Total Bilirubin: 0.4 mg/dL (ref 0.0–1.2)
Total Protein: 6.9 g/dL (ref 6.5–8.1)

## 2025-01-15 MED ORDER — ALENDRONATE SODIUM 70 MG PO TABS: 70.0000 mg | ORAL_TABLET | ORAL | 1 refills | Status: AC

## 2025-01-15 MED ORDER — EXEMESTANE 25 MG PO TABS: 25.0000 mg | ORAL_TABLET | Freq: Every day | ORAL | 3 refills | Status: AC

## 2025-01-15 NOTE — Progress Notes (Signed)
 Patient has some concerns to address during today's visit.

## 2025-01-15 NOTE — Progress Notes (Signed)
 "    Hematology/Oncology Consult note [facility]  [phone] [phone]  Patient Care Team: Glendia Shad, MD as PCP - General (Internal Medicine) Georgina Shasta POUR, RN as Oncology Nurse Navigator Melanee Annah BROCKS, MD as Consulting Physician (Oncology) Desiderio Schanz, MD as Consulting Physician (General Surgery) Lenn Aran, MD as Consulting Physician (Radiation Oncology)   Name of the patient: Angela Pacheco  [id number]  01/25/1966   Date of visit: 01/15/25  Diagnosis-  Cancer Staging  Malignant neoplasm of upper-outer quadrant of left breast in female, estrogen receptor positive (HCC) Staging form: Breast, AJCC 8th Edition - Clinical stage from 08/13/2024: Stage IA (cT1a, cN0, cM0, G1, ER+, PR-, HER2-) - Signed by Melanee Annah BROCKS, MD on 08/13/2024 Stage prefix: Initial diagnosis Histologic grading system: 3 Pacheco system - Pathologic stage from 09/12/2024: Stage IA (pT1b, pN0(sn), cM0, G1, ER+, PR-, HER2-, Oncotype DX score: 21) - Signed by Melanee Annah BROCKS, MD on 09/12/2024 Stage prefix: Initial diagnosis Method of lymph node assessment: Sentinel lymph node biopsy Multigene prognostic tests performed: Oncotype DX Recurrence score range: Greater than or equal to 11 Histologic grading system: 3 Pacheco system    Chief complaint/ Reason for visit-discuss bone density scan results and further management  Heme/Onc history: patient is a 59 yr old Female with a past medical history significant for hypothyroidism who underwent a routine screening mammogram in July 2025 which showed possible abnormality in her left breast.  This was followed by diagnostic mammogram and an ultrasound which showed 5 x 5 x 4 mm irregular hypoechoic mass in the left upper outer breast at the 1:30 position 6 cm from the nipple.  No suspicious left axillary lymph nodes.  This was biopsied and was consistent with invasive mammary carcinoma Pacheco 1 ER 95% positive, PR negative  and HER2 negative +1.  Patient will be meeting with Dr. Desiderio today to discuss lumpectomy and possible sentinel lymph node biopsy.   Family history significant for breast cancer in her paternal cousin.  Dad had melanoma and maternal grandparents had colon cancer.  Menarche at the age of 44.  G2, P2.  Age at first birth 36.  She has taken birth control in the past.  She underwent hysterectomy at the age of 33 for endometriosis but still has ovaries in situ.  Hormone levels were postmenopausal   Final lumpectomy pathology from 08/23/2024 showed 1 cm Pacheco 1 invasive mammary Carcinoma with negative margins.  2 sentinel lymph nodes negative for malignancy.  Tumor was ER positive PR negative and HER2 negative.  Oncotype score was 21 and therefore patient does not require any adjuvant chemotherapy.  Genetic testing negative  Bone density scan on 01/02/2025 showed a T-score of -2.7 in the left femur neck consistent with osteoporosis    Interval history- Angela Pacheco is a 59 year old woman with stage I estrogen receptor positive breast cancer and osteoporosis who presents for management of bone health and endocrine therapy side effects.  She completed surgical resection and radiation for stage I estrogen receptor positive breast cancer, with an oncotype score of 21, and did not require chemotherapy. She has been on adjuvant anastrozole . She experiences significant sleep disturbance, frequent nocturnal awakenings with hot flashes, and persistent fatigue. Adjusting the timing of anastrozole  dosing has not improved her symptoms.  She has osteoporosis, confirmed by a T-score of -2.7 in the right femur, which predates initiation of anastrozole . She remains physically active with regular walking and strength training, but osteoporosis persists despite these  measures. She has not had fragility fractures but has a history of other fractures. She does not smoke or consume alcohol. Her medications include daily  omeprazole and magnesium supplementation. She underwent partial hysterectomy in her early thirties with preserved ovarian function.      ECOG PS- 0 Pain scale- 0   Review of systems- Review of Systems  Constitutional:  Negative for chills, fever, malaise/fatigue and weight loss.  HENT:  Negative for congestion, ear discharge and nosebleeds.   Eyes:  Negative for blurred vision.  Respiratory:  Negative for cough, hemoptysis, sputum production, shortness of breath and wheezing.   Cardiovascular:  Negative for chest pain, palpitations, orthopnea and claudication.  Gastrointestinal:  Negative for abdominal pain, blood in stool, constipation, diarrhea, heartburn, melena, nausea and vomiting.  Genitourinary:  Negative for dysuria, flank pain, frequency, hematuria and urgency.  Musculoskeletal:  Negative for back pain, joint pain and myalgias.  Skin:  Negative for rash.  Neurological:  Negative for dizziness, tingling, focal weakness, seizures, weakness and headaches.  Endo/Heme/Allergies:  Does not bruise/bleed easily.  Psychiatric/Behavioral:  Negative for depression and suicidal ideas. The patient does not have insomnia.       Allergies[1]   Past Medical History:  Diagnosis Date   Allergy     cats trigger   Arthritis June 2022   Asthma Years ago   mild   B12 deficiency 05/06/2019   Complication of anesthesia    Dysplastic nevus 11/01/2017   Right middle shoulder, mod, excision by Dr Arlyss   Dysplastic nevus 07/03/2020   L medial scapular back, lat to T5, moderate, excision by Dr Arlyss   Dysplastic nevus 06/09/2020   L scapular back lateral, mild   Hypercholesteremia 01/31/2024   Hypothyroidism    Malignant neoplasm of upper-outer quadrant of left breast in female, estrogen receptor positive (HCC) 08/13/2024   Oxygen deficiency 2023   Use mouth sleep appliance. Respire   PONV (postoperative nausea and vomiting)    Sleep apnea 2023   Thyroid  disease 1998     Past  Surgical History:  Procedure Laterality Date   ABDOMINAL HYSTERECTOMY     cervix left in place   ANTERIOR CRUCIATE LIGAMENT REPAIR Left 2017   APPENDECTOMY  1991   AXILLARY SENTINEL NODE BIOPSY Left 08/23/2024   Procedure: BIOPSY, LYMPH NODE, SENTINEL, AXILLARY;  Surgeon: Desiderio Schanz, MD;  Location: [facility];  Service: General;  Laterality: Left;   BREAST BIOPSY Left 08/03/2024   US  LT BREAST BX W LOC DEV 1ST LESION IMG BX SPEC US  GUIDE 08/03/2024 [facility]   BREAST BIOPSY Left 08/15/2024   US  LT BREAST SAVI/RF TAG 1ST LESION US  GUIDE 08/15/2024 [facility]   BREAST LUMPECTOMY WITH RADIO FREQUENCY LOCALIZER Left 08/23/2024   Procedure: BREAST LUMPECTOMY WITH RADIO FREQUENCY LOCALIZER;  Surgeon: Desiderio Schanz, MD;  Location: [facility];  Service: General;  Laterality: Left;   CHOLECYSTECTOMY     COLONOSCOPY WITH PROPOFOL  N/A 10/08/2019   Procedure: COLONOSCOPY WITH PROPOFOL ;  Surgeon: Unk Corinn Skiff, MD;  Location: [facility];  Service: Gastroenterology;  Laterality: N/A;   MASS EXCISION Right 08/23/2024   Procedure: EXCISION, MASS, CHEST WALL;  Surgeon: Desiderio Schanz, MD;  Location: [facility];  Service: General;  Laterality: Right;    Social History   Socioeconomic History   Marital status: Married    Spouse name: Franky   Number of children: 1   Years of education: Not on file   Highest education level: Bachelor's degree (e.g., BA, AB, BS)  Occupational History  Not on file  Tobacco Use   Smoking status: Never    Passive exposure: Never   Smokeless tobacco: Never  Vaping Use   Vaping status: Never Used  Substance and Sexual Activity   Alcohol use: Yes    Comment: Seltzer maybe 1-2 a week   Drug use: No   Sexual activity: Yes    Birth control/protection: Post-menopausal    Comment: N/A  Other Topics Concern   Not on file  Social History Narrative   Not on file   Social Drivers of Health   Tobacco Use: Low Risk  (12/13/2024)   Received [facility]   Patient History    Smoking Tobacco Use: Never    Smokeless Tobacco Use: Never    Passive Exposure: Not on file  Financial Resource Strain: Low Risk  (09/13/2024)   Received from [facility]   Overall Financial Resource Strain (CARDIA)    Difficulty of Paying Living Expenses: Not hard at all  Food Insecurity: No Food Insecurity (09/13/2024)   Received from [facility]   Epic    Within the past 12 months, you worried that your food would run out before you got the money to buy more.: Never true    Within the past 12 months, the food you bought just didn't last and you didn't have money to get more.: Never true  Transportation Needs: No Transportation Needs (09/13/2024)   Received from [facility] - Transportation    In the past 12 months, has lack of transportation kept you from medical appointments or from getting medications?: No    Lack of Transportation (Non-Medical): No  Physical Activity: Insufficiently Active (01/31/2024)   Exercise Vital Sign    Days of Exercise per Week: 3 days    Minutes of Exercise per Session: 30 min  Stress: No Stress Concern Present (01/31/2024)   [employer] of Occupational Health - Occupational Stress Questionnaire    Feeling of Stress : Only a little  Social Connections: Socially Integrated (01/31/2024)   Social Connection and Isolation Panel    Frequency of Communication with Friends and Family: More than three times a week    Frequency of Social Gatherings with Friends and Family: Once a week    Attends Religious Services: More than 4 times per year    Active Member of [employer] or Organizations: Yes    Attends Banker Meetings: More than 4 times per year    Marital Status: Married  Catering Manager Violence: Not At Risk (08/13/2024)   Epic    Fear of Current or Ex-Partner: No    Emotionally Abused: No    Physically Abused: No    Sexually Abused: No   Depression (PHQ2-9): Low Risk (12/12/2024)   Depression (PHQ2-9)    PHQ-2 Score: 0  Alcohol Screen: Low Risk (01/31/2024)   Alcohol Screen    Last Alcohol Screening Score (AUDIT): 2  Housing: Low Risk  (09/13/2024)   [facility]   Epic    In the last 12 months, was there a time when you were not able to pay the mortgage or rent on time?: No    In the past 12 months, how many times have you moved where you were living?: 0    At any time in the past 12 months, were you homeless or living in a shelter (including now)?: No  Utilities: Not At Risk (09/13/2024)  Received from [facility]   Epic    In the past 12 months has the electric, gas, oil, or water company threatened to shut off services in your home?: No  Health Literacy: Not on file    Family History  Problem Relation Age of Onset   Arthritis Mother    Hearing loss Mother    Melanoma Father        stage IV   Heart disease Father    Arthritis Sister    Hearing loss Sister    Heart disease Brother    Clotting disorder Brother    Lung cancer Maternal Aunt    Colon cancer Maternal Uncle        in polyp   Colon cancer Maternal Grandmother 94   Colon cancer Maternal Grandfather 11   Asthma Daughter    Autoimmune disease Daughter    Breast cancer Paternal Cousin     Current Medications[2]  Physical exam: There were no vitals filed for this visit. Physical Exam Cardiovascular:     Rate and Rhythm: Normal rate and regular rhythm.     Heart sounds: Normal heart sounds.  Pulmonary:     Effort: Pulmonary effort is normal.     Breath sounds: Normal breath sounds.  Skin:    General: Skin is warm and dry.  Neurological:     Mental Status: She is alert and oriented to person, place, and time.      I have personally reviewed labs listed below:    Latest Ref Rng & Units 08/01/2024   10:50 AM  CMP  Glucose 70 - 99 mg/dL 98   BUN 6 - 23 mg/dL 20   Creatinine 9.59 - 1.20  mg/dL 9.09   Sodium 864 - 854 mEq/L 139   Potassium 3.5 - 5.1 mEq/L 4.0   Chloride 96 - 112 mEq/L 102   CO2 19 - 32 mEq/L 29   Calcium 8.4 - 10.5 mg/dL 9.7   Total Protein 6.0 - 8.3 g/dL 7.1   Total Bilirubin 0.2 - 1.2 mg/dL 0.6   Alkaline Phos 39 - 117 U/L 50   AST 0 - 37 U/L 15   ALT 0 - 35 U/L 21       Latest Ref Rng & Units 10/31/2024    3:02 PM  CBC  WBC 4.0 - 10.5 K/uL 6.7   Hemoglobin 12.0 - 15.0 g/dL 85.7   Hematocrit 63.9 - 46.0 % 41.6   Platelets 150 - 400 K/uL 263    I have personally reviewed Radiology images listed below: No images are attached to the encounter.  DG Bone Density Result Date: 01/02/2025 EXAM: DUAL X-RAY ABSORPTIOMETRY (DXA) FOR BONE MINERAL DENSITY 01/02/2025 9:08 am CLINICAL DATA:  59 year old Female Postmenopausal. Breast cancer TECHNIQUE: An axial (e.g., hips, spine) and/or appendicular (e.g., radius) exam was performed, as appropriate, using GE Secretary/administrator at [facility]. Images are obtained for bone mineral density measurement and are not obtained for diagnostic purposes. [id number] Exclusions: None. COMPARISON:  None. FINDINGS: Scan quality: Good. LUMBAR SPINE (L1-L4): BMD (in g/cm2): 1.032 T-score: -1.3 Z-score: -0.4 LEFT FEMORAL NECK: BMD (in g/cm2): 0.667 T-score: -2.7 Z-score: -1.6 LEFT TOTAL HIP: BMD (in g/cm2): 0.734 T-score: -2.2 Z-score: -1.4 RIGHT FEMORAL NECK: BMD (in g/cm2): 0.684 T-score: -2.5 Z-score: -1.5 RIGHT TOTAL HIP: BMD (in g/cm2): 0.779 T-score: -1.8 Z-score: -1.1 LEFT FOREARM (RADIUS 33%): BMD (in g/cm2): 0.809 T-score: -0.8 Z-score: -0.2 FRAX 10-YEAR PROBABILITY OF FRACTURE:  FRAX not reported as the lowest BMD is not in the osteopenia range. IMPRESSION: Osteoporosis based on BMD. Fracture risk is increased. Increased risk is based on low BMD. RECOMMENDATIONS: 1. All patients should optimize calcium and vitamin D intake. 2. Consider FDA-approved medical therapies in postmenopausal women and men aged 75 years  and older, based on the following: - A hip or vertebral (clinical or morphometric) fracture - T-score less than or equal to -2.5 and secondary causes have been excluded. - Low bone mass (T-score between -1.0 and -2.5) and a 10-year probability of a hip fracture greater than or equal to 3% or a 10-year probability of a major osteoporosis-related fracture greater than or equal to 20% based on the [place] -adapted WHO algorithm. - Clinician judgment and/or patient preferences may indicate treatment for people with 10-year fracture probabilities above or below these levels 3. Patients with diagnosis of osteoporosis or at high risk for fracture should have regular bone mineral density tests. For patients eligible for Medicare, routine testing is allowed once every 2 years. The testing frequency can be increased to one year for patients who have rapidly progressing disease, those who are receiving or discontinuing medical therapy to restore bone mass, or have additional risk factors. Electronically Signed   By: Dina  Arceo M.D.   On: 01/02/2025 09:27     Assessment and plan- Patient is a 59 y.o. female with history of pathological prognostic stage Ia invasive mammary carcinoma of the left breast pT1b N0 M0 ER positive PR negative HER2 negative.  Oncotype was 21 and she did not require adjuvant chemotherapy.  She completed adjuvant radiation therapy and is presently on Arimidex .  She is here to discuss bone density scan results and further management  Assessment and Plan    Estrogen receptor positive breast cancer, status post-surgery, on endocrine therapy Stage I estrogen receptor positive breast cancer, post-surgery and radiation, on adjuvant endocrine therapy. Anastrozole  causing sleep disturbance and vasomotor symptoms. Alternative aromatase inhibitors may improve tolerability. Endocrine therapy for at least five years recommended to reduce recurrence risk. - Instructed to discontinue anastrozole  for two weeks for  symptom resolution and sleep recovery. - Prescribed exemestane  to start after two-week washout from anastrozole . - Recommended melatonin or magnesium glycinate for sleep support during therapy break. - Scheduled follow-up in three months to assess exemestane  tolerability and efficacy. - Advised to contact office if sleep disturbances or adverse effects persist.  Age-related osteoporosis Osteoporosis with T-score of -2.7 in right femur, predating aromatase inhibitor therapy. Aromatase inhibitors increase fracture risk, necessitating pharmacologic intervention. Prefers oral bisphosphonate therapy. - Prescribed alendronate  once weekly, to begin two weeks after starting exemestane . - Provided alendronate  administration instructions (empty stomach, remain upright 30-60 minutes, no other medications for one hour). - Discussed potential gastrointestinal side effects, advised monitoring for dyspepsia or reflux. - Reviewed IV bisphosphonate (zoledronic acid) as alternative if oral therapy not tolerated, including risks of osteonecrosis of the jaw and need for dental clearance prior to initiation. - Planned bone mineral density assessment every two years, reassess therapy after three years, consider drug holiday if bone density stabilizes.         Visit Diagnosis 1. Malignant neoplasm of upper-outer quadrant of left breast in female, estrogen receptor positive (HCC)   2. Age-related osteoporosis without current pathological fracture      Dr. Annah Skene, MD, MPH [facility] [phone] 01/15/2025 9:15 AM                    [  1]  Allergies Allergen Reactions   Gluten Meal Other (See Comments)    GI upset, joint pain, headache   Wound Dressing Adhesive Rash    BAND-AIDS  [2]  Current Outpatient Medications:    acetaminophen  (TYLENOL ) 500 MG tablet, Take 2 tablets (1,000 mg total) by mouth every 6 (six) hours as needed for mild pain (pain score 1-3).,  Disp: , Rfl:    anastrozole  (ARIMIDEX ) 1 MG tablet, Take 1 tablet (1 mg total) by mouth daily., Disp: 90 tablet, Rfl: 1   Calcium Carbonate-Vit D-Min (CALTRATE PLUS PO), Take 1 tablet by mouth in the morning and at bedtime., Disp: , Rfl:    cetirizine (ZYRTEC) 10 MG tablet, Take 10 mg by mouth at bedtime., Disp: , Rfl:    cyanocobalamin  (VITAMIN B12) 1000 MCG/ML injection, INJECT 1 ML INTO THE MUSCLE EVERY 30 DAYS, Disp: 10 mL, Rfl: 0   ibuprofen  (ADVIL ) 600 MG tablet, Take 1 tablet (600 mg total) by mouth every 8 (eight) hours as needed for moderate pain (pain score 4-6)., Disp: 60 tablet, Rfl: 1   levothyroxine  (SYNTHROID ) 100 MCG tablet, TAKE 1 TABLET(100 MCG) BY MOUTH DAILY, Disp: 90 tablet, Rfl: 0   Magnesium 400 MG CAPS, Take 400 mg by mouth every evening., Disp: , Rfl:    Omega-3 Fatty Acids (OMEGA 3 FISH OIL PO), Take 2 capsules by mouth at bedtime., Disp: , Rfl:    omeprazole (PRILOSEC OTC) 20 MG tablet, Take 20 mg by mouth in the morning., Disp: , Rfl:    sulfaSALAzine (AZULFIDINE) 500 MG EC tablet, Take 500 mg by mouth., Disp: , Rfl:    SYRINGE-NEEDLE, DISP, 3 ML (BD INTEGRA SYRINGE) 25G X 1 3 ML MISC, Use as directed with B12 injections, Disp: 100 each, Rfl: 0   Triamcinolone Acetonide (NASACORT AQ NA), Place 1 spray into the nose in the morning and at bedtime., Disp: , Rfl:    vitamin C (ASCORBIC ACID) 500 MG tablet, Take 500 mg by mouth 2 (two) times a week., Disp: , Rfl:   "

## 2025-01-17 ENCOUNTER — Inpatient Hospital Stay

## 2025-01-17 NOTE — Progress Notes (Signed)
 [facility] CSW Progress Note  Clinical Social Work Intern contacted patient by phone in response to patient's voicemail on 01/15/25 regarding emotional support. Patient is adjusting to survivorship and seeking additional support.    Interventions: Referred patient to [facility] Your New [facility]) beginning 03/05/25 Provided information on how to register for free massage Informed patient of the support team roles and support services at [facility]; Emailed program calendars for [facility]  Provided CSW contact information and encouraged patient to call with any questions or concerns     Follow Up Plan:  Patient will contact CSW with any support or resource needs    Thersia KATHEE Daring Clinical Social Work Intern [facility]

## 2025-01-25 ENCOUNTER — Telehealth: Payer: Self-pay

## 2025-01-25 NOTE — Telephone Encounter (Signed)
 [facility] CSW Progress Note  Patient notified Massage Certificates were placed in mail CSW on 1/26. Patient still interested in survivorship support. Unable to drive to [place] for [place]. Patient requested CSW to let her know if any survivorship support groups in the area.    Lizbeth Sprague, LCSW Clinical Social Worker [facility]

## 2025-02-01 ENCOUNTER — Ambulatory Visit: Admitting: Internal Medicine

## 2025-02-01 ENCOUNTER — Encounter: Payer: Self-pay | Admitting: Internal Medicine

## 2025-02-01 DIAGNOSIS — E538 Deficiency of other specified B group vitamins: Secondary | ICD-10-CM

## 2025-02-01 DIAGNOSIS — E039 Hypothyroidism, unspecified: Secondary | ICD-10-CM

## 2025-02-01 DIAGNOSIS — E7800 Pure hypercholesterolemia, unspecified: Secondary | ICD-10-CM

## 2025-02-01 DIAGNOSIS — C50412 Malignant neoplasm of upper-outer quadrant of left female breast: Secondary | ICD-10-CM

## 2025-02-01 LAB — LIPID PANEL
Cholesterol: 217 mg/dL — ABNORMAL HIGH (ref 28–200)
HDL: 64.4 mg/dL
LDL Cholesterol: 140 mg/dL — ABNORMAL HIGH (ref 10–99)
NonHDL: 152.98
Total CHOL/HDL Ratio: 3
Triglycerides: 63 mg/dL (ref 10.0–149.0)
VLDL: 12.6 mg/dL (ref 0.0–40.0)

## 2025-02-01 LAB — VITAMIN B12: Vitamin B-12: 600 pg/mL (ref 211–911)

## 2025-02-01 LAB — TSH: TSH: 5.67 u[IU]/mL — ABNORMAL HIGH (ref 0.35–5.50)

## 2025-02-01 MED ORDER — LEVOTHYROXINE SODIUM 100 MCG PO TABS: ORAL_TABLET | ORAL | 1 refills | Status: AC

## 2025-02-01 MED ORDER — CYANOCOBALAMIN 1000 MCG/ML IJ SOLN: INTRAMUSCULAR | 0 refills | Status: AC

## 2025-02-01 NOTE — Progress Notes (Unsigned)
 "  Subjective:    Patient ID: Angela Pacheco, female    DOB: [birth year], 59 y.o.   MRN: [id number]  Patient here for No chief complaint on file.   HPI Here for a scheduled follow up - recently diagnosed with breast cancer. Had f/u 01/15/25. Anastrozole  causing sleep disturbance and vasomotor symptoms. Recommended to discontinue anastrozole  for tw weeks. Recommended melatonin or magnesium. Also started on fosamax . Had f/u with rheumatology 12/13/24 - Seronegative sponyloarthropathy-Improved.    Past Medical History:  Diagnosis Date   Allergy     cats trigger   Arthritis June 2022   Asthma Years ago   mild   B12 deficiency 05/06/2019   Breast cancer (HCC)    Complication of anesthesia    Dysplastic nevus 11/01/2017   Right middle shoulder, mod, excision by Dr Arlyss   Dysplastic nevus 07/03/2020   L medial scapular back, lat to T5, moderate, excision by Dr Arlyss   Dysplastic nevus 06/09/2020   L scapular back lateral, mild   Hypercholesteremia 01/31/2024   Hypothyroidism    Malignant neoplasm of upper-outer quadrant of left breast in female, estrogen receptor positive (HCC) 08/13/2024   Oxygen deficiency 2023   Use mouth sleep appliance. Respire   PONV (postoperative nausea and vomiting)    Sleep apnea 2023   Thyroid  disease 1998   Past Surgical History:  Procedure Laterality Date   ABDOMINAL HYSTERECTOMY     cervix left in place   ANTERIOR CRUCIATE LIGAMENT REPAIR Left 2017   APPENDECTOMY  1991   AXILLARY SENTINEL NODE BIOPSY Left 08/23/2024   Procedure: BIOPSY, LYMPH NODE, SENTINEL, AXILLARY;  Surgeon: Desiderio Schanz, MD;  Location: [facility];  Service: General;  Laterality: Left;   BREAST BIOPSY Left 08/03/2024   US  LT BREAST BX W LOC DEV 1ST LESION IMG BX SPEC US  GUIDE 08/03/2024 [facility]   BREAST BIOPSY Left 08/15/2024   US  LT BREAST SAVI/RF TAG 1ST LESION US  GUIDE 08/15/2024 [facility]   BREAST LUMPECTOMY WITH RADIO FREQUENCY LOCALIZER Left 08/23/2024    Procedure: BREAST LUMPECTOMY WITH RADIO FREQUENCY LOCALIZER;  Surgeon: Desiderio Schanz, MD;  Location: [facility];  Service: General;  Laterality: Left;   BREAST SURGERY     CHOLECYSTECTOMY     COLONOSCOPY WITH PROPOFOL  N/A 10/08/2019   Procedure: COLONOSCOPY WITH PROPOFOL ;  Surgeon: Unk Corinn Skiff, MD;  Location: [facility];  Service: Gastroenterology;  Laterality: N/A;   MASS EXCISION Right 08/23/2024   Procedure: EXCISION, MASS, CHEST WALL;  Surgeon: Desiderio Schanz, MD;  Location: [facility];  Service: General;  Laterality: Right;   Family History  Problem Relation Age of Onset   Arthritis Mother    Hearing loss Mother    Hyperlipidemia Mother    Melanoma Father        stage IV   Heart disease Father    Cancer Father    Diabetes Father    Arthritis Sister    Hearing loss Sister    Heart disease Brother    Clotting disorder Brother    Lung cancer Maternal Aunt    Colon cancer Maternal Uncle        in polyp   Colon cancer Maternal Grandmother 52   Cancer Maternal Grandmother    Colon cancer Maternal Grandfather 34   Asthma Daughter    Autoimmune disease Daughter    Breast cancer Paternal Cousin    Cancer Maternal Uncle    Social History   Socioeconomic History   Marital status: Married    Spouse  name: Franky   Number of children: 1   Years of education: Not on file   Highest education level: Bachelor's degree (e.g., BA, AB, BS)  Occupational History   Not on file  Tobacco Use   Smoking status: Never    Passive exposure: Never   Smokeless tobacco: Never  Vaping Use   Vaping status: Never Used  Substance and Sexual Activity   Alcohol use: Yes    Comment: Seltzer maybe 1-2 a week   Drug use: No   Sexual activity: Yes    Birth control/protection: Post-menopausal    Comment: N/A  Other Topics Concern   Not on file  Social History Narrative   Not on file   Social Drivers of Health   Tobacco Use: Low Risk (02/01/2025)   Patient History    Smoking Tobacco  Use: Never    Smokeless Tobacco Use: Never    Passive Exposure: Never  Financial Resource Strain: Low Risk (01/28/2025)   Overall Financial Resource Strain (CARDIA)    Difficulty of Paying Living Expenses: Not hard at all  Food Insecurity: No Food Insecurity (01/28/2025)   Epic    Worried About Radiation Protection Practitioner of Food in the Last Year: Never true    Ran Out of Food in the Last Year: Never true  Transportation Needs: No Transportation Needs (01/28/2025)   Epic    Lack of Transportation (Medical): No    Lack of Transportation (Non-Medical): No  Physical Activity: Sufficiently Active (01/28/2025)   Exercise Vital Sign    Days of Exercise per Week: 5 days    Minutes of Exercise per Session: 30 min  Stress: No Stress Concern Present (01/28/2025)   [employer] of Occupational Health - Occupational Stress Questionnaire    Feeling of Stress: Only a little  Social Connections: Moderately Integrated (01/28/2025)   Social Connection and Isolation Panel    Frequency of Communication with Friends and Family: More than three times a week    Frequency of Social Gatherings with Friends and Family: Once a week    Attends Religious Services: More than 4 times per year    Active Member of Clubs or Organizations: No    Attends Banker Meetings: Not on file    Marital Status: Married  Depression (PHQ2-9): Low Risk (01/15/2025)   Depression (PHQ2-9)    PHQ-2 Score: 0  Alcohol Screen: Low Risk (01/31/2024)   Alcohol Screen    Last Alcohol Screening Score (AUDIT): 2  Housing: Low Risk (01/28/2025)   Epic    Unable to Pay for Housing in the Last Year: No    Number of Times Moved in the Last Year: 0    Homeless in the Last Year: No  Utilities: Not At Risk (09/13/2024)   Received from [facility]   Epic    In the past 12 months has the electric, gas, oil, or water company threatened to shut off services in your home?: No  Health Literacy: Not on file     Review of Systems      Objective:     BP 106/66   Pulse 68   Temp 98.1 F (36.7 C) (Oral)   Ht 5' (1.524 m)   Wt 120 lb (54.4 kg)   SpO2 97%   BMI 23.44 kg/m  Wt Readings from Last 3 Encounters:  02/01/25 120 lb (54.4 kg)  01/15/25 119 lb 8 oz (54.2 kg)  12/12/24 118 lb (53.5 kg)    Physical Exam  {  Perform Simple Foot Exam  Perform Detailed exam:1} {Insert foot Exam (Optional):30965}   Outpatient Encounter Medications as of 02/01/2025  Medication Sig   acetaminophen  (TYLENOL ) 500 MG tablet Take 2 tablets (1,000 mg total) by mouth every 6 (six) hours as needed for mild pain (pain score 1-3).   alendronate  (FOSAMAX ) 70 MG tablet Take 1 tablet (70 mg total) by mouth once a week. Take with a full glass of water on an empty stomach.   Calcium Carbonate-Vit D-Min (CALTRATE PLUS PO) Take 1 tablet by mouth in the morning and at bedtime.   cetirizine (ZYRTEC) 10 MG tablet Take 10 mg by mouth at bedtime.   cyanocobalamin  (VITAMIN B12) 1000 MCG/ML injection INJECT 1 ML INTO THE MUSCLE EVERY 30 DAYS   exemestane  (AROMASIN ) 25 MG tablet Take 1 tablet (25 mg total) by mouth daily after breakfast.   ibuprofen  (ADVIL ) 600 MG tablet Take 1 tablet (600 mg total) by mouth every 8 (eight) hours as needed for moderate pain (pain score 4-6).   levothyroxine  (SYNTHROID ) 100 MCG tablet TAKE 1 TABLET(100 MCG) BY MOUTH DAILY   Magnesium 400 MG CAPS Take 400 mg by mouth every evening.   Omega-3 Fatty Acids (OMEGA 3 FISH OIL PO) Take 2 capsules by mouth at bedtime.   omeprazole (PRILOSEC OTC) 20 MG tablet Take 20 mg by mouth in the morning.   sulfaSALAzine (AZULFIDINE) 500 MG EC tablet Take 500 mg by mouth.   SYRINGE-NEEDLE, DISP, 3 ML (BD INTEGRA SYRINGE) 25G X 1 3 ML MISC Use as directed with B12 injections   Triamcinolone Acetonide (NASACORT AQ NA) Place 1 spray into the nose in the morning and at bedtime.   vitamin C (ASCORBIC ACID) 500 MG tablet Take 500 mg by mouth 2 (two) times a week.   No facility-administered  encounter medications on file as of 02/01/2025.     Lab Results  Component Value Date   WBC 6.7 10/31/2024   HGB 14.2 10/31/2024   HCT 41.6 10/31/2024   PLT 263 10/31/2024   GLUCOSE 85 01/15/2025   CHOL 208 (H) 08/01/2024   TRIG 99.0 08/01/2024   HDL 64.40 08/01/2024   LDLCALC 124 (H) 08/01/2024   ALT 19 01/15/2025   AST 24 01/15/2025   NA 142 01/15/2025   K 4.1 01/15/2025   CL 105 01/15/2025   CREATININE 0.85 01/15/2025   BUN 15 01/15/2025   CO2 26 01/15/2025   TSH 1.67 08/01/2024    DG Bone Density Result Date: 01/02/2025 EXAM: DUAL X-RAY ABSORPTIOMETRY (DXA) FOR BONE MINERAL DENSITY 01/02/2025 9:08 am CLINICAL DATA:  59 year old Female Postmenopausal. Breast cancer TECHNIQUE: An axial (e.g., hips, spine) and/or appendicular (e.g., radius) exam was performed, as appropriate, using GE Secretary/administrator at [facility]. Images are obtained for bone mineral density measurement and are not obtained for diagnostic purposes. [id number] Exclusions: None. COMPARISON:  None. FINDINGS: Scan quality: Good. LUMBAR SPINE (L1-L4): BMD (in g/cm2): 1.032 T-score: -1.3 Z-score: -0.4 LEFT FEMORAL NECK: BMD (in g/cm2): 0.667 T-score: -2.7 Z-score: -1.6 LEFT TOTAL HIP: BMD (in g/cm2): 0.734 T-score: -2.2 Z-score: -1.4 RIGHT FEMORAL NECK: BMD (in g/cm2): 0.684 T-score: -2.5 Z-score: -1.5 RIGHT TOTAL HIP: BMD (in g/cm2): 0.779 T-score: -1.8 Z-score: -1.1 LEFT FOREARM (RADIUS 33%): BMD (in g/cm2): 0.809 T-score: -0.8 Z-score: -0.2 FRAX 10-YEAR PROBABILITY OF FRACTURE: FRAX not reported as the lowest BMD is not in the osteopenia range. IMPRESSION: Osteoporosis based on BMD. Fracture risk is increased. Increased risk is based on  low BMD. RECOMMENDATIONS: 1. All patients should optimize calcium and vitamin D intake. 2. Consider FDA-approved medical therapies in postmenopausal women and men aged 48 years and older, based on the following: - A hip or vertebral (clinical or morphometric) fracture  - T-score less than or equal to -2.5 and secondary causes have been excluded. - Low bone mass (T-score between -1.0 and -2.5) and a 10-year probability of a hip fracture greater than or equal to 3% or a 10-year probability of a major osteoporosis-related fracture greater than or equal to 20% based on the [place] -adapted WHO algorithm. - Clinician judgment and/or patient preferences may indicate treatment for people with 10-year fracture probabilities above or below these levels 3. Patients with diagnosis of osteoporosis or at high risk for fracture should have regular bone mineral density tests. For patients eligible for Medicare, routine testing is allowed once every 2 years. The testing frequency can be increased to one year for patients who have rapidly progressing disease, those who are receiving or discontinuing medical therapy to restore bone mass, or have additional risk factors. Electronically Signed   By: Dina  Arceo M.D.   On: 01/02/2025 09:27       Assessment & Plan:  Malignant neoplasm of upper-outer quadrant of left breast in female, estrogen receptor positive (HCC)  Hypercholesteremia  Hypothyroidism, unspecified type  B12 deficiency     Allena Hamilton, MD "

## 2025-04-11 ENCOUNTER — Inpatient Hospital Stay: Admitting: *Deleted

## 2025-04-12 ENCOUNTER — Inpatient Hospital Stay

## 2025-04-12 ENCOUNTER — Inpatient Hospital Stay: Admitting: Oncology

## 2025-06-10 ENCOUNTER — Ambulatory Visit: Admitting: Radiation Oncology

## 2025-07-30 ENCOUNTER — Encounter: Admitting: Dermatology

## 2025-08-02 ENCOUNTER — Encounter: Admitting: Internal Medicine
